# Patient Record
Sex: Female | Born: 1970 | Race: White | Hispanic: No | Marital: Married | Smoking: Never smoker
Health system: Southern US, Community
[De-identification: ages and names within clinical notes are randomized; demographics above are authoritative.]

## PROBLEM LIST (undated history)

## (undated) DIAGNOSIS — I10 Essential (primary) hypertension: Secondary | ICD-10-CM

## (undated) DIAGNOSIS — M1990 Unspecified osteoarthritis, unspecified site: Secondary | ICD-10-CM

## (undated) DIAGNOSIS — E611 Iron deficiency: Secondary | ICD-10-CM

## (undated) DIAGNOSIS — K219 Gastro-esophageal reflux disease without esophagitis: Secondary | ICD-10-CM

## (undated) DIAGNOSIS — N979 Female infertility, unspecified: Secondary | ICD-10-CM

## (undated) DIAGNOSIS — E538 Deficiency of other specified B group vitamins: Secondary | ICD-10-CM

## (undated) DIAGNOSIS — Z9889 Other specified postprocedural states: Secondary | ICD-10-CM

## (undated) DIAGNOSIS — Z903 Acquired absence of stomach [part of]: Secondary | ICD-10-CM

## (undated) DIAGNOSIS — N200 Calculus of kidney: Secondary | ICD-10-CM

## (undated) DIAGNOSIS — F32A Depression, unspecified: Secondary | ICD-10-CM

## (undated) DIAGNOSIS — F419 Anxiety disorder, unspecified: Secondary | ICD-10-CM

## (undated) DIAGNOSIS — J45909 Unspecified asthma, uncomplicated: Secondary | ICD-10-CM

## (undated) DIAGNOSIS — N840 Polyp of corpus uteri: Secondary | ICD-10-CM

## (undated) DIAGNOSIS — T7840XA Allergy, unspecified, initial encounter: Secondary | ICD-10-CM

## (undated) DIAGNOSIS — D649 Anemia, unspecified: Secondary | ICD-10-CM

## (undated) DIAGNOSIS — R112 Nausea with vomiting, unspecified: Secondary | ICD-10-CM

## (undated) DIAGNOSIS — F329 Major depressive disorder, single episode, unspecified: Secondary | ICD-10-CM

## (undated) HISTORY — DX: Deficiency of other specified B group vitamins: E53.8

## (undated) HISTORY — PX: COSMETIC SURGERY: SHX468

## (undated) HISTORY — PX: TONSILLECTOMY: SHX5217

## (undated) HISTORY — DX: Calculus of kidney: N20.0

## (undated) HISTORY — DX: Female infertility, unspecified: N97.9

## (undated) HISTORY — PX: APPENDECTOMY: SHX54

## (undated) HISTORY — DX: Anemia, unspecified: D64.9

## (undated) HISTORY — DX: Acquired absence of stomach (part of): Z90.3

## (undated) HISTORY — DX: Gastro-esophageal reflux disease without esophagitis: K21.9

## (undated) HISTORY — DX: Iron deficiency: E61.1

## (undated) HISTORY — DX: Essential (primary) hypertension: I10

## (undated) HISTORY — DX: Polyp of corpus uteri: N84.0

## (undated) HISTORY — PX: HERNIA REPAIR: SHX51

## (undated) HISTORY — DX: Unspecified osteoarthritis, unspecified site: M19.90

## (undated) HISTORY — PX: CHOLECYSTECTOMY: SHX55

## (undated) HISTORY — DX: Allergy, unspecified, initial encounter: T78.40XA

## (undated) HISTORY — PX: TUBAL LIGATION: SHX77

---

## 1998-06-10 ENCOUNTER — Ambulatory Visit (HOSPITAL_COMMUNITY): Admission: RE | Admit: 1998-06-10 | Discharge: 1998-06-10 | Payer: Self-pay | Admitting: Obstetrics and Gynecology

## 1999-10-11 ENCOUNTER — Other Ambulatory Visit: Admission: RE | Admit: 1999-10-11 | Discharge: 1999-10-11 | Payer: Self-pay | Admitting: Obstetrics and Gynecology

## 2000-11-10 ENCOUNTER — Emergency Department (HOSPITAL_COMMUNITY): Admission: EM | Admit: 2000-11-10 | Discharge: 2000-11-10 | Payer: Self-pay | Admitting: Emergency Medicine

## 2000-11-29 ENCOUNTER — Emergency Department (HOSPITAL_COMMUNITY): Admission: EM | Admit: 2000-11-29 | Discharge: 2000-11-29 | Payer: Self-pay | Admitting: Emergency Medicine

## 2000-11-30 ENCOUNTER — Encounter: Payer: Self-pay | Admitting: Emergency Medicine

## 2000-12-03 ENCOUNTER — Other Ambulatory Visit: Admission: RE | Admit: 2000-12-03 | Discharge: 2000-12-03 | Payer: Self-pay | Admitting: Obstetrics and Gynecology

## 2004-12-06 ENCOUNTER — Emergency Department (HOSPITAL_COMMUNITY): Admission: EM | Admit: 2004-12-06 | Discharge: 2004-12-07 | Payer: Self-pay | Admitting: Emergency Medicine

## 2006-02-21 ENCOUNTER — Ambulatory Visit: Payer: Self-pay | Admitting: Family Medicine

## 2006-03-04 ENCOUNTER — Ambulatory Visit: Payer: Self-pay | Admitting: Family Medicine

## 2006-04-04 ENCOUNTER — Ambulatory Visit: Payer: Self-pay | Admitting: Family Medicine

## 2006-04-23 ENCOUNTER — Ambulatory Visit: Payer: Self-pay | Admitting: Family Medicine

## 2006-05-07 ENCOUNTER — Ambulatory Visit: Payer: Self-pay | Admitting: Family Medicine

## 2006-06-05 ENCOUNTER — Ambulatory Visit: Payer: Self-pay | Admitting: Family Medicine

## 2006-06-11 DIAGNOSIS — F419 Anxiety disorder, unspecified: Secondary | ICD-10-CM | POA: Insufficient documentation

## 2006-06-11 DIAGNOSIS — E669 Obesity, unspecified: Secondary | ICD-10-CM | POA: Insufficient documentation

## 2006-06-11 DIAGNOSIS — G43909 Migraine, unspecified, not intractable, without status migrainosus: Secondary | ICD-10-CM | POA: Insufficient documentation

## 2006-06-11 DIAGNOSIS — F411 Generalized anxiety disorder: Secondary | ICD-10-CM | POA: Insufficient documentation

## 2006-06-11 DIAGNOSIS — E6609 Other obesity due to excess calories: Secondary | ICD-10-CM | POA: Insufficient documentation

## 2006-06-11 DIAGNOSIS — N809 Endometriosis, unspecified: Secondary | ICD-10-CM | POA: Insufficient documentation

## 2006-06-17 ENCOUNTER — Ambulatory Visit: Payer: Self-pay | Admitting: Family Medicine

## 2006-08-08 ENCOUNTER — Encounter: Payer: Self-pay | Admitting: Family Medicine

## 2006-09-03 HISTORY — PX: OTHER SURGICAL HISTORY: SHX169

## 2006-09-25 ENCOUNTER — Ambulatory Visit: Payer: Self-pay | Admitting: Family Medicine

## 2006-09-27 ENCOUNTER — Encounter: Payer: Self-pay | Admitting: Family Medicine

## 2006-09-27 LAB — CONVERTED CEMR LAB
Ferritin: 8 ng/mL — ABNORMAL LOW (ref 10–291)
Iron: 32 ug/dL — ABNORMAL LOW (ref 42–145)
Saturation Ratios: 10 % — ABNORMAL LOW (ref 20–55)
TIBC: 335 ug/dL (ref 250–470)
UIBC: 303 ug/dL

## 2006-09-30 ENCOUNTER — Telehealth: Payer: Self-pay | Admitting: Family Medicine

## 2006-10-03 ENCOUNTER — Telehealth: Payer: Self-pay | Admitting: Family Medicine

## 2006-10-23 ENCOUNTER — Ambulatory Visit: Payer: Self-pay | Admitting: Family Medicine

## 2006-10-24 ENCOUNTER — Encounter: Payer: Self-pay | Admitting: Family Medicine

## 2006-11-04 ENCOUNTER — Ambulatory Visit: Payer: Self-pay | Admitting: Family Medicine

## 2006-12-18 ENCOUNTER — Ambulatory Visit (HOSPITAL_COMMUNITY): Admission: RE | Admit: 2006-12-18 | Discharge: 2006-12-18 | Payer: Self-pay | Admitting: Obstetrics and Gynecology

## 2006-12-18 ENCOUNTER — Encounter (INDEPENDENT_AMBULATORY_CARE_PROVIDER_SITE_OTHER): Payer: Self-pay | Admitting: Specialist

## 2007-05-12 ENCOUNTER — Ambulatory Visit: Payer: Self-pay | Admitting: Family Medicine

## 2007-05-16 ENCOUNTER — Ambulatory Visit: Payer: Self-pay | Admitting: Family Medicine

## 2007-05-16 ENCOUNTER — Encounter: Admission: RE | Admit: 2007-05-16 | Discharge: 2007-05-16 | Payer: Self-pay | Admitting: Family Medicine

## 2007-05-16 DIAGNOSIS — D509 Iron deficiency anemia, unspecified: Secondary | ICD-10-CM | POA: Insufficient documentation

## 2007-05-16 DIAGNOSIS — J45909 Unspecified asthma, uncomplicated: Secondary | ICD-10-CM | POA: Insufficient documentation

## 2007-05-20 ENCOUNTER — Telehealth (INDEPENDENT_AMBULATORY_CARE_PROVIDER_SITE_OTHER): Payer: Self-pay | Admitting: *Deleted

## 2007-05-26 ENCOUNTER — Encounter: Payer: Self-pay | Admitting: Family Medicine

## 2007-05-26 LAB — CONVERTED CEMR LAB
ALT: 13 units/L (ref 0–35)
AST: 11 units/L (ref 0–37)
Albumin: 3.4 g/dL — ABNORMAL LOW (ref 3.5–5.2)
Alkaline Phosphatase: 27 units/L — ABNORMAL LOW (ref 39–117)
BUN: 12 mg/dL (ref 6–23)
CO2: 21 meq/L (ref 19–32)
Calcium: 8.1 mg/dL — ABNORMAL LOW (ref 8.4–10.5)
Chloride: 108 meq/L (ref 96–112)
Cholesterol: 187 mg/dL (ref 0–200)
Creatinine, Ser: 0.71 mg/dL (ref 0.40–1.20)
Ferritin: 7 ng/mL — ABNORMAL LOW (ref 10–291)
Glucose, Bld: 92 mg/dL (ref 70–99)
HCT: 37.2 % (ref 36.0–46.0)
HDL: 70 mg/dL (ref >39)
Hemoglobin: 11.5 g/dL — ABNORMAL LOW (ref 12.0–15.0)
LDL Cholesterol: 96 mg/dL (ref 0–99)
MCHC: 30.9 g/dL (ref 30.0–36.0)
MCV: 87.1 fL (ref 78.0–100.0)
Platelets: 142 10*3/uL — ABNORMAL LOW (ref 150–400)
Potassium: 4.3 meq/L (ref 3.5–5.3)
RBC: 4.27 M/uL (ref 3.87–5.11)
RDW: 15.3 % — ABNORMAL HIGH (ref 11.5–14.0)
Sodium: 139 meq/L (ref 135–145)
TSH: 3.633 microintl units/mL (ref 0.350–5.50)
Total Bilirubin: 0.2 mg/dL — ABNORMAL LOW (ref 0.3–1.2)
Total CHOL/HDL Ratio: 2.7
Total Protein: 6 g/dL (ref 6.0–8.3)
Triglycerides: 105 mg/dL (ref <150)
VLDL: 21 mg/dL (ref 0–40)
WBC: 5.9 10*3/uL (ref 4.0–10.5)

## 2007-05-28 ENCOUNTER — Telehealth: Payer: Self-pay | Admitting: Family Medicine

## 2007-05-29 ENCOUNTER — Ambulatory Visit: Payer: Self-pay | Admitting: Family Medicine

## 2007-05-29 LAB — CONVERTED CEMR LAB
Bilirubin Urine: NEGATIVE
Glucose, Urine, Semiquant: NEGATIVE
Ketones, urine, test strip: NEGATIVE
Nitrite: NEGATIVE
Specific Gravity, Urine: 1.025
Urobilinogen, UA: NEGATIVE
pH: 6

## 2007-05-30 ENCOUNTER — Encounter: Payer: Self-pay | Admitting: Family Medicine

## 2007-05-30 LAB — CONVERTED CEMR LAB: Thyroperoxidase Ab SerPl-aCnc: <25 (ref 0.0–60.0)

## 2007-06-04 ENCOUNTER — Ambulatory Visit: Payer: Self-pay | Admitting: Family Medicine

## 2007-06-04 DIAGNOSIS — D518 Other vitamin B12 deficiency anemias: Secondary | ICD-10-CM | POA: Insufficient documentation

## 2007-07-07 ENCOUNTER — Ambulatory Visit: Payer: Self-pay | Admitting: Family Medicine

## 2007-07-08 ENCOUNTER — Telehealth (INDEPENDENT_AMBULATORY_CARE_PROVIDER_SITE_OTHER): Payer: Self-pay | Admitting: *Deleted

## 2007-08-08 ENCOUNTER — Ambulatory Visit: Payer: Self-pay | Admitting: Family Medicine

## 2007-09-11 ENCOUNTER — Ambulatory Visit: Payer: Self-pay | Admitting: Family Medicine

## 2007-09-12 ENCOUNTER — Encounter: Payer: Self-pay | Admitting: Family Medicine

## 2007-09-15 ENCOUNTER — Telehealth: Payer: Self-pay | Admitting: Family Medicine

## 2007-10-09 ENCOUNTER — Ambulatory Visit: Payer: Self-pay | Admitting: Family Medicine

## 2007-10-30 ENCOUNTER — Ambulatory Visit: Payer: Self-pay | Admitting: Gastroenterology

## 2007-10-30 LAB — CONVERTED CEMR LAB
Folate: >20 ng/mL
Saturation Ratios: 10.9 % — ABNORMAL LOW (ref 20.0–50.0)
Transferrin: 236 mg/dL (ref >=212.0)
Vitamin B-12: 538 pg/mL (ref 211–911)

## 2007-11-03 ENCOUNTER — Encounter: Payer: Self-pay | Admitting: Gastroenterology

## 2007-11-03 ENCOUNTER — Encounter: Payer: Self-pay | Admitting: Family Medicine

## 2007-11-03 ENCOUNTER — Ambulatory Visit: Payer: Self-pay | Admitting: Gastroenterology

## 2007-11-03 LAB — HM COLONOSCOPY: HM Colonoscopy: NORMAL

## 2007-12-04 ENCOUNTER — Ambulatory Visit: Payer: Self-pay | Admitting: Gastroenterology

## 2007-12-11 ENCOUNTER — Ambulatory Visit: Payer: Self-pay | Admitting: Family Medicine

## 2007-12-11 DIAGNOSIS — E049 Nontoxic goiter, unspecified: Secondary | ICD-10-CM | POA: Insufficient documentation

## 2007-12-22 ENCOUNTER — Encounter: Payer: Self-pay | Admitting: Family Medicine

## 2007-12-22 DIAGNOSIS — K449 Diaphragmatic hernia without obstruction or gangrene: Secondary | ICD-10-CM | POA: Insufficient documentation

## 2008-06-07 ENCOUNTER — Telehealth: Payer: Self-pay | Admitting: Gastroenterology

## 2008-06-09 ENCOUNTER — Encounter: Payer: Self-pay | Admitting: Gastroenterology

## 2008-07-15 ENCOUNTER — Ambulatory Visit: Payer: Self-pay | Admitting: Family Medicine

## 2008-07-15 DIAGNOSIS — G4700 Insomnia, unspecified: Secondary | ICD-10-CM | POA: Insufficient documentation

## 2008-07-16 ENCOUNTER — Ambulatory Visit: Payer: Self-pay | Admitting: Family Medicine

## 2008-07-16 ENCOUNTER — Telehealth: Payer: Self-pay | Admitting: Family Medicine

## 2008-07-16 LAB — CONVERTED CEMR LAB
Protein, U semiquant: 30
Specific Gravity, Urine: >=1.03
pH: 6

## 2008-07-19 ENCOUNTER — Telehealth: Payer: Self-pay | Admitting: Family Medicine

## 2008-07-21 ENCOUNTER — Telehealth: Payer: Self-pay | Admitting: Family Medicine

## 2008-07-26 ENCOUNTER — Telehealth: Payer: Self-pay | Admitting: Family Medicine

## 2008-09-06 ENCOUNTER — Ambulatory Visit: Payer: Self-pay | Admitting: Family Medicine

## 2008-09-17 ENCOUNTER — Telehealth: Payer: Self-pay | Admitting: Family Medicine

## 2009-06-16 ENCOUNTER — Ambulatory Visit: Payer: Self-pay | Admitting: Gastroenterology

## 2009-06-16 DIAGNOSIS — K589 Irritable bowel syndrome without diarrhea: Secondary | ICD-10-CM | POA: Insufficient documentation

## 2009-06-20 ENCOUNTER — Ambulatory Visit (HOSPITAL_COMMUNITY): Admission: RE | Admit: 2009-06-20 | Discharge: 2009-06-20 | Payer: Self-pay | Admitting: Gastroenterology

## 2009-06-20 LAB — CONVERTED CEMR LAB
Albumin: 3.7 g/dL (ref 3.5–5.2)
Alkaline Phosphatase: 40 units/L (ref 39–117)
Basophils Absolute: 0 10*3/uL (ref 0.0–0.1)
Bilirubin, Direct: 0.1 mg/dL (ref 0.0–0.3)
Calcium: 9 mg/dL (ref 8.4–10.5)
Ferritin: 7.8 ng/mL — ABNORMAL LOW (ref 10.0–291.0)
Folate: 7 ng/mL
GFR calc non Af Amer: 85.23 mL/min (ref >60)
HCT: 37.2 % (ref 36.0–46.0)
Lymphs Abs: 1.2 10*3/uL (ref 0.7–4.0)
MCHC: 33.8 g/dL (ref 30.0–36.0)
MCV: 81.9 fL (ref 78.0–100.0)
Monocytes Absolute: 0.4 10*3/uL (ref 0.1–1.0)
Platelets: 183 10*3/uL (ref 150.0–400.0)
RDW: 15.1 % — ABNORMAL HIGH (ref 11.5–14.6)
Saturation Ratios: 16.2 % — ABNORMAL LOW (ref 20.0–50.0)
Sodium: 141 meq/L (ref 135–145)
Transferrin: 273.6 mg/dL (ref 212.0–360.0)
Vitamin B-12: 277 pg/mL (ref 211–911)

## 2009-06-24 ENCOUNTER — Ambulatory Visit: Payer: Self-pay | Admitting: Family Medicine

## 2009-08-30 ENCOUNTER — Encounter: Admission: RE | Admit: 2009-08-30 | Discharge: 2009-09-01 | Payer: Self-pay | Admitting: Specialist

## 2009-09-03 HISTORY — PX: OTHER SURGICAL HISTORY: SHX169

## 2009-09-08 ENCOUNTER — Encounter: Admission: RE | Admit: 2009-09-08 | Discharge: 2009-12-07 | Payer: Self-pay | Admitting: Specialist

## 2010-01-23 ENCOUNTER — Encounter: Admission: RE | Admit: 2010-01-23 | Discharge: 2010-03-16 | Payer: Self-pay | Admitting: Specialist

## 2010-04-25 ENCOUNTER — Ambulatory Visit: Payer: Self-pay | Admitting: Family Medicine

## 2010-06-19 ENCOUNTER — Telehealth: Payer: Self-pay | Admitting: Family Medicine

## 2010-06-19 ENCOUNTER — Encounter: Payer: Self-pay | Admitting: Gastroenterology

## 2010-06-20 ENCOUNTER — Encounter: Payer: Self-pay | Admitting: Family Medicine

## 2010-06-20 ENCOUNTER — Encounter: Payer: Self-pay | Admitting: Gastroenterology

## 2010-06-21 LAB — CONVERTED CEMR LAB
AST: 12 units/L (ref 0–37)
BUN: 12 mg/dL (ref 6–23)
CO2: 20 meq/L (ref 19–32)
Calcium: 8.8 mg/dL (ref 8.4–10.5)
Chloride: 106 meq/L (ref 96–112)
Cholesterol: 176 mg/dL (ref 0–200)
Creatinine, Ser: 0.84 mg/dL (ref 0.40–1.20)
Glucose, Bld: 95 mg/dL (ref 70–99)
HCT: 38.5 % (ref 36.0–46.0)
HDL: 56 mg/dL (ref >39)
Hemoglobin: 12.6 g/dL (ref 12.0–15.0)
RBC: 4.58 M/uL (ref 3.87–5.11)
RDW: 15.4 % (ref 11.5–15.5)
Saturation Ratios: 12 % — ABNORMAL LOW (ref 20–55)
Total CHOL/HDL Ratio: 3.1
Triglycerides: 50 mg/dL (ref <150)

## 2010-06-23 ENCOUNTER — Ambulatory Visit: Payer: Self-pay | Admitting: Family Medicine

## 2010-06-30 ENCOUNTER — Ambulatory Visit: Payer: Self-pay | Admitting: Gastroenterology

## 2010-09-03 HISTORY — PX: OTHER SURGICAL HISTORY: SHX169

## 2010-10-05 NOTE — Miscellaneous (Signed)
Summary: Labs and Aciphex  Called Dr. Jeneen Rinks office to see if they will add IBC, Ferritin, B12, and folate to the labs that they already plan on drawing. I will refill the Aciphex for one month to give the patient time to have her labs.  Prescriptions: ACIPHEX 20 MG TBEC (RABEPRAZOLE SODIUM) 1 by mouth two times a day  #60 x 0   Entered by:   Bernita Buffy CMA ([place])   Authorized by:   Sable Feil MD [facility]   Signed by:   Bernita Buffy CMA ([place]) on 06/19/2010   Method used:   Electronically to        [phone]* (retail)       [address], [place]  [address]       Ph: [phone]       Fax: [phone]   RxID:   [id number]

## 2010-10-05 NOTE — Assessment & Plan Note (Signed)
Summary: sinusitis   Vital Signs:  Patient profile:   40 year old female Height:      65.5 inches Weight:      265 pounds BMI:     43.58 O2 Sat:      99 % on Room air Temp:     98.3 degrees F oral Pulse rate:   109 / minute BP sitting:   141 / 84  (left arm) Cuff size:   large  Vitals Entered By: Sherlean Foot CMA (April 25, 2010 2:32 PM)  O2 Flow:  Room air CC: congestion and ears stopped up x 5 days.   Primary Care Provider:  Loyal Gambler DO  CC:  congestion and ears stopped up x 5 days.Marland Kitchen  History of Present Illness: 40 yo WF presents for head congestion x 5 days with ear pressure that worsened on the L side yesterday.  Started with a sore throat.  Denies any cough.  No rhinorrhea but has postnasal drip.  She has increased fatigue.  She does have allergies.  She denies facial pressure or HAs.    She is taking Mucinex-D which is not helping.    Current Medications (verified): 1)  Xopenex Hfa 45 Mcg/act Aero (Levalbuterol Tartrate) .... 2 Puffs Q 4-6 H Prn 2)  Aciphex 20 Mg Tbec (Rabeprazole Sodium) .Marland Kitchen.. 1 By Mouth Two Times A Day  Allergies (verified): No Known Drug Allergies  Past History:  Past Medical History: Reviewed history from 12/22/2007 and no changes required. [facility] Flow 440  06/05/2006  grief rxn -- may need counseling infertility  uterine polyps-- sees Gyn for paps migraines and sleep problems-- sees Dr Berdine Addison, EEG normal 12-07 GERD with moderate hiatal hernia (EGD 3-09), Dr Sharlett Iles  Past Surgical History: Tonsillectomy Endometrial ablation spring 2008 R knee lateral release surgery 2011  Social History: Reviewed history from 06/16/2009 and no changes required. 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  Unable to have children.  Has 5 dogs.  Father ill with terminal cancer.  Wants to lose wt.  Not exercising.  Works part time at [employer]. Patient has never smoked.  Alcohol Use - yes  occ  Review of Systems      See  HPI  Physical Exam  General:  alert, well-developed, well-nourished, and well-hydrated.  obese Head:  normocephalic and atraumatic.  sinuses NTTP Eyes:  conjunctiva clear Ears:  EACs patent; TMs translucent and gray with good cone of light and bony landmarks.  Nose:  erythematous, slightly boggy nasal turbinates, no rhinorrhea Mouth:  o/p pink and moist, slightly injected Neck:  no masses.  tender, mildly enlarged submandibular LNs Lungs:  Normal respiratory effort, chest expands symmetrically. Lungs are clear to auscultation, no crackles or wheezes. Heart:  Normal rate and regular rhythm. S1 and S2 normal without gallop, murmur, click, rub or other extra sounds. Skin:  color normal.     Impression & Recommendations:  Problem # 1:  ACUTE SINUSITIS, UNSPECIFIED (ICD-461.9) Treat with 5 days of Zithromax + Veramyst nasal spray.  Call if not improving after 7-10 days.   Her updated medication list for this problem includes:    Azithromycin 250 Mg Tabs (Azithromycin) .Marland Kitchen... 2 tabs by mouth x 1 day then 1 tab by mouth daily x 4 days    Veramyst 27.5 Mcg/spray Susp (Fluticasone furoate) .Marland Kitchen... 2 sprays per nostril daily  Complete Medication List: 1)  Xopenex Hfa 45 Mcg/act Aero (Levalbuterol tartrate) .... 2 puffs q 4-6 h prn  2)  Aciphex 20 Mg Tbec (Rabeprazole sodium) .Marland Kitchen.. 1 by mouth two times a day 3)  Azithromycin 250 Mg Tabs (Azithromycin) .... 2 tabs by mouth x 1 day then 1 tab by mouth daily x 4 days 4)  Veramyst 27.5 Mcg/spray Susp (Fluticasone furoate) .... 2 sprays per nostril daily  Patient Instructions: 1)  For sinusitis: 2)  Take 5 days of Azithromycin. 3)  Use Veramyst 2 sprays/ nostril daily. 4)  Use Mucinex D as needed for the next wk. 5)  Call if not improved after 7-10 days. 6)  Return for a PHYSICAL with fasting labs in 6 wks. Prescriptions: AZITHROMYCIN 250 MG TABS (AZITHROMYCIN) 2 tabs by mouth x 1 day then 1 tab by mouth daily x 4 days  #6 tabs x 0   Entered and  Authorized by:   Loyal Gambler DO   Signed by:   Loyal Gambler DO on 04/25/2010   Method used:   Electronically to        [facility] [phone]* (retail)       [address] [place], [place]  [address]       Ph: [phone]       Fax: [phone]   RxID:   [id number]

## 2010-10-05 NOTE — Assessment & Plan Note (Signed)
Summary: f/u on labs- jr   Vital Signs:  Patient profile:   40 year old female Height:      65.5 inches Weight:      270 pounds Pulse rate:   95 / minute BP sitting:   131 / 81  (left arm) Cuff size:   large  Vitals Entered By: Geanie Kenning LPN (October 21, 624THL 1:52 PM) CC: go over lab results   Primary Care Provider:  Loyal Gambler DO  CC:  go over lab results.  History of Present Illness: 40 yo WF presents for iron deficiency.    She just had labs drawn. Her periods come every month and are light, lasting 2-3 days.  She has not had any rectal bleeding. She has not had abd pain.  Had a normal EGD/ colonoscopy in 09. Feeling cold all the time and has fatigue.  B12 level also came back low normal.  Current Medications (verified): 1)  Xopenex Hfa 45 Mcg/act Aero (Levalbuterol Tartrate) .... 2 Puffs Q 4-6 H Prn 2)  Aciphex 20 Mg Tbec (Rabeprazole Sodium) .Marland Kitchen.. 1 By Mouth Two Times A Day 3)  Azithromycin 250 Mg Tabs (Azithromycin) .... 2 Tabs By Mouth X 1 Day Then 1 Tab By Mouth Daily X 4 Days  Allergies (verified): No Known Drug Allergies  Comments:  Nurse/Medical Assistant: The patient's medications and allergies were reviewed with the patient and were updated in the Medication and Allergy Lists. Geanie Kenning LPN (October 21, 624THL 1:53 PM)  Past History:  Past Medical History: [facility] Flow 440  06/05/2006  grief rxn -- may need counseling infertility  uterine polyps-- sees Gyn for paps migraines and sleep problems-- sees Dr Berdine Addison, EEG normal 12-07 GERD with moderate hiatal hernia (EGD 3-09), Dr Sharlett Iles  iron def B12 def  Past Surgical History: Reviewed history from 04/25/2010 and no changes required. Tonsillectomy Endometrial ablation spring 2008 R knee lateral release surgery 2011  Social History: Reviewed history from 06/16/2009 and no changes required. 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  Unable to have children.  Has 5  dogs.  Father ill with terminal cancer.  Wants to lose wt.  Not exercising.  Works part time at [employer]. Patient has never smoked.  Alcohol Use - yes  occ  Review of Systems      See HPI  Physical Exam  General:  alert, well-developed, well-nourished, and well-hydrated.  obese Neck:  no masses.   Lungs:  Normal respiratory effort, chest expands symmetrically. Lungs are clear to auscultation, no crackles or wheezes. Heart:  Normal rate and regular rhythm. S1 and S2 normal without gallop, murmur, click, rub or other extra sounds. Abdomen:  soft and non-tender.   Skin:  color normal.  conjunctival pallor   Impression & Recommendations:  Problem # 1:  ANEMIA, IRON DEFICIENCY NOS (ICD-280.9) She had N/V from [facility].  Will try her on Ferrous Sulfate 2 x a day.  REviewed recent labs that were drawn.  Not having heavy menses and had a normal EGD/ colonoscopy in 09.  She is not having abd pain or rectal bleeding and has a normal diet.  If she tolerates this iron, plan to repeat levels in 8 wks.  If not, refer to hematologist for IV iron. Her updated medication list for this problem includes:    Ferrous Sulfate 325 (65 Fe) Mg Tabs (Ferrous sulfate) .Marland Kitchen... 1 tab by mouth two times a day  Hgb: 12.6 (06/20/2010)   Hct:  38.5 (06/20/2010)   Platelets: 210 (06/20/2010) RBC: 4.58 (06/20/2010)   RDW: 15.4 (06/20/2010)   WBC: 6.3 (06/20/2010) MCV: 84.1 (06/20/2010)   MCHC: 32.7 (06/20/2010) Ferritin: 13 (06/20/2010) Iron: 45 (06/20/2010)   TIBC: 371 (06/20/2010)   % Sat: 12 (06/20/2010) B12: 335 (06/20/2010)   Folate: 7.0 (06/16/2009)   TSH: 1.20 (06/16/2009)  Problem # 2:  ANEMIA, VITAMIN B12 DEFICIENCY (ICD-281.1) Will try her on sublingual B12 once daily.  Declined B12 injections.  REpeat B12 level in 8 wks. Her updated medication list for this problem includes:    Ferrous Sulfate 325 (65 Fe) Mg Tabs (Ferrous sulfate) .Marland Kitchen... 1 tab by mouth two times a day  Hgb: 12.6 (06/20/2010)   Hct: 38.5  (06/20/2010)   Platelets: 210 (06/20/2010) RBC: 4.58 (06/20/2010)   RDW: 15.4 (06/20/2010)   WBC: 6.3 (06/20/2010) MCV: 84.1 (06/20/2010)   MCHC: 32.7 (06/20/2010) Ferritin: 13 (06/20/2010) Iron: 45 (06/20/2010)   TIBC: 371 (06/20/2010)   % Sat: 12 (06/20/2010) B12: 335 (06/20/2010)   Folate: 7.0 (06/16/2009)   TSH: 1.20 (06/16/2009)  Complete Medication List: 1)  Xopenex Hfa 45 Mcg/act Aero (Levalbuterol tartrate) .... 2 puffs q 4-6 h prn 2)  Aciphex 20 Mg Tbec (Rabeprazole sodium) .Marland Kitchen.. 1 by mouth two times a day 3)  Azithromycin 250 Mg Tabs (Azithromycin) .... 2 tabs by mouth x 1 day then 1 tab by mouth daily x 4 days 4)  Ferrous Sulfate 325 (65 Fe) Mg Tabs (Ferrous sulfate) .Marland Kitchen.. 1 tab by mouth two times a day  Patient Instructions: 1)  Try Ferrous Sulfate 1 tab by mouth two times a day for iron deficiency. 2)  You may want to take Colace (a stool softener) along w/ it to prevent constipation. 3)  Start OTC B12 sublingual daily. 4)  Return for f/u iron deficiency in 8 wks. Prescriptions: FERROUS SULFATE 325 (65 FE) MG TABS (FERROUS SULFATE) 1 tab by mouth two times a day  #60 x 1   Entered and Authorized by:   Loyal Gambler DO   Signed by:   Loyal Gambler DO on 06/23/2010   Method used:   Electronically to        [facility] [phone]* (retail)       [place], [place]  [address]       Ph: [phone]       Fax: [phone]   RxID:   [id number]    Orders Added: 1)  Est. Patient Level III [id number]

## 2010-10-05 NOTE — Medication Information (Signed)
Summary: P Auth/Approval/[employer]  P Auth/Approval/[employer]   Imported By: Bubba Hales 06/27/2010 09:54:16  _____________________________________________________________________  External Attachment:    Type:   Image     Comment:   External Document

## 2010-10-05 NOTE — Progress Notes (Signed)
Summary: Lab order  Phone Note Call from Patient   Caller: Patient Summary of Call: Pt scheduled OV for f/u next week. She would like to have fasting labs done tomorrow. Please enter labs along w/ Dr. Buel Ream order  (IBC, Ferritin, B12, and folate) and I will fax.  Initial call taken by: Sherlean Foot CMA,  June 19, 2010 3:15 PM  Follow-up for Phone Call        labs put in.  Can draw fasting any morning M thru F Follow-up by: Loyal Gambler DO,  June 19, 2010 3:53 PM  New Problems: OTH&UNSPEC ENDOCRN NUTRIT METAB&IMMUNITY D/O (ICD-V77.99) SCREENING FOR LIPOID DISORDERS (ICD-V77.91)   New Problems: OTH&UNSPEC ENDOCRN NUTRIT METAB&IMMUNITY D/O (ICD-V77.99) SCREENING FOR LIPOID DISORDERS (ICD-V77.91)

## 2011-01-02 ENCOUNTER — Encounter: Payer: Self-pay | Admitting: Family Medicine

## 2011-01-02 ENCOUNTER — Ambulatory Visit (INDEPENDENT_AMBULATORY_CARE_PROVIDER_SITE_OTHER): Payer: 59 | Admitting: Family Medicine

## 2011-01-02 ENCOUNTER — Telehealth: Payer: Self-pay | Admitting: Family Medicine

## 2011-01-02 VITALS — BP 153/94 | HR 110 | Ht 65.5 in | Wt 287.0 lb

## 2011-01-02 DIAGNOSIS — R600 Localized edema: Secondary | ICD-10-CM

## 2011-01-02 DIAGNOSIS — E538 Deficiency of other specified B group vitamins: Secondary | ICD-10-CM

## 2011-01-02 DIAGNOSIS — R609 Edema, unspecified: Secondary | ICD-10-CM

## 2011-01-02 DIAGNOSIS — G43909 Migraine, unspecified, not intractable, without status migrainosus: Secondary | ICD-10-CM

## 2011-01-02 LAB — POCT URINALYSIS DIPSTICK
Glucose, UA: NEGATIVE
Leukocytes, UA: NEGATIVE
Nitrite, UA: NEGATIVE
Protein, UA: NEGATIVE
Urobilinogen, UA: 0.2

## 2011-01-02 MED ORDER — FUROSEMIDE 40 MG PO TABS: 40.0000 mg | ORAL_TABLET | Freq: Every day | ORAL | Status: DC

## 2011-01-02 MED ORDER — NORTRIPTYLINE HCL 25 MG PO CAPS: 25.0000 mg | ORAL_CAPSULE | Freq: Every day | ORAL | Status: DC

## 2011-01-02 NOTE — Telephone Encounter (Signed)
Patient request to call her cell phone once her results are back

## 2011-01-02 NOTE — Progress Notes (Signed)
  Subjective:    Patient ID: Denise Craig, female    DOB: [birth year], 40 y.o.   MRN: [id number]  HPI 40 yo WF presents for increased foot swelling for the past 2 wks.  She flew last wk and swelling worse.  When she works at [employer] , she stands a lot.  She has been getting more frequent migraines, now behind her R eye.  She is not in pain right now.  She is taking Aleve several days/ wk for HAs.  She is seeing ortho for her knee pain that did not improve after surgery.  This pain is limiting her ability to exercise and she has continued to gain wt.  Denies problems voiding.   BP 153/94  Pulse 110  Ht 5' 5.5" (1.664 m)  Wt 287 lb (130.182 kg)  BMI 47.03 kg/m2  SpO2 98%  LMP 11/21/2010  Patient Active Problem List  Diagnoses  . THYROMEGALY  . OBESITY, NOS  . ANEMIA, IRON DEFICIENCY NOS  . ANEMIA, VITAMIN B12 DEFICIENCY  . ANXIETY  . DEPRESSION  . MIGRAINE, UNSPEC., W/O INTRACTABLE MIGRAINE  . EYE PAIN, RIGHT  . ACUTE SINUSITIS, UNSPECIFIED  . ASTHMA, PERSISTENT, MODERATE  . GERD  . HIATAL HERNIA  . IBS  . ENDOMETRIOSIS  . LUMBAGO  . INSOMNIA  . ABDOMINAL PAIN-RUQ  . ABNORMAL RESULT, FUNCTION STUDY, LIVER     Review of Systems  Constitutional: Negative for fatigue.  Respiratory: Negative for shortness of breath.   Cardiovascular: Positive for leg swelling. Negative for chest pain and palpitations.  Genitourinary: Negative for difficulty urinating.  Musculoskeletal: Positive for arthralgias.  Neurological: Positive for headaches.  Psychiatric/Behavioral: Positive for dysphoric mood.       Objective:   Physical Exam  Constitutional: She appears well-developed and well-nourished. No distress.       obese  Eyes: Conjunctivae are normal. No scleral icterus.  Neck: Neck supple. No thyromegaly present.  Cardiovascular: Normal rate, regular rhythm and normal heart sounds.  Exam reveals no friction rub.   No murmur heard. Pulmonary/Chest: Effort normal and breath  sounds normal. No respiratory distress. She has no wheezes.  Musculoskeletal: She exhibits edema (1+ pitting pedal edema bilat).  Lymphadenopathy:    She has no cervical adenopathy.  Skin: Skin is warm and dry.  Psychiatric: She has a normal mood and affect.          Assessment & Plan:

## 2011-01-02 NOTE — Assessment & Plan Note (Signed)
Increased frequency of migraines with overuse of Aleve which may be one of the causes for her leg swellling.  Will start her on Pamelor 25 mg qhs for migraine prevention.  Change aleve to tylenol prn.  rtc for f/u migraines in 6 wks.

## 2011-01-02 NOTE — Assessment & Plan Note (Signed)
Edema has worsened possibly from aleve use, obesity.  Urine is negative for protein.   Will  Get CMP today.  She had a TSH with her OB today. She is to stick to a low sodium diet, elevate legs and use Lasix 40 mg/day for the nexjt 10 days and then recheck. This should also bring her BP down.

## 2011-01-02 NOTE — Patient Instructions (Addendum)
Labs today. Will call you w/ results tomorrow.  Stop Aleve.  Use Tylenol for aches and pains.  Elevate legs as much as you can. Stick to a low sodium diet.  Start Furosemide once daily. Eat 1 banana/ day while on this to replace potassium.  Start Pamelor at bedtime for migraine prevention.  REturn for f/u swelling and migraines in 3 wks.

## 2011-01-03 LAB — COMPLETE METABOLIC PANEL WITH GFR
Alkaline Phosphatase: 49 U/L (ref 39–117)
CO2: 20 mEq/L (ref 19–32)
Creat: 0.76 mg/dL (ref 0.40–1.20)
GFR, Est African American: >60 mL/min (ref >60)
GFR, Est Non African American: >60 mL/min (ref >60)
Glucose, Bld: 113 mg/dL — ABNORMAL HIGH (ref 70–99)
Sodium: 138 mEq/L (ref 135–145)
Total Bilirubin: 0.3 mg/dL (ref 0.3–1.2)

## 2011-01-04 ENCOUNTER — Telehealth: Payer: Self-pay | Admitting: Family Medicine

## 2011-01-04 NOTE — Telephone Encounter (Signed)
LMOM informing Pt of the above

## 2011-01-04 NOTE — Telephone Encounter (Signed)
Pls let pt know that her chemsitry panel shows a sugar of 113 -- was she fasting??? Vitamin B12 is low normal.  She should be taking an OTC B complex vitamin daily.

## 2011-01-16 NOTE — Assessment & Plan Note (Signed)
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  Mrs. Going has had iron deficiency anemia and low B12 counts for the last  several months, and has been on oral iron replacement therapy.  This  caused irritable bowel dysfunction with alternating diarrhea and  constipation.  She occasionally will have some rectal bleeding with  constipation.  She has been on oral iron without improvement in her  anemia with a hemoglobin of approximately 11.5 with a low serum ferritin  of 4.  Besides her alternating bowel habits, she has chronic acid reflux  symptoms with associated nocturnal waking, and does have some associated  asthmatic bronchitis symptomatology.  She denies dysphagia or any  hepatobiliary complaints.  She has never had barium studies or  endoscopic exams of her bowels.   She has been treated for asthma and chronic headaches by Dr. Valetta Close, and  is also followed from a GYN standpoint by Dr. Orvan Seen.  She apparently  has endometriosis, had endometrial ablation, but continues with  menometrorrhagia.  The patient apparently desires a hysterectomy and  this has not been performed.   In terms of abdominal pain, she does occasionally have lower abdominal  discomfort with her gas and bloating, but denies severe abdominal pain  or any upper GI discomfort, history of hepatitis or pancreatitis.  Her  appetite is good and she has gained 10 pounds in weight over the last  year.   PAST MEDICAL HISTORY:  Remarkable for asthmatic bronchitis for the last  10 years, and she said she personally relates this with her acid reflux.  She also has a history of anxiety, depression, and  panic attacks.  She  has had a previous tonsillectomy, but no abdominal surgery.   MEDICATIONS:  She takes a daily iron tablet, Xopenex and p.r.n. Imitrex.  Her drug list from Dr. Valetta Close includes a variety of p.r.n. medications  including inhalers, Soma, Singulair, amlodipine 10 mg once a day,  Ketoprofen 75 mg with headaches, p.r.n. metoclopramide, and p.r.n.  Meclizine.  Patient denies drug allergies.  She does use p.r.n.  antacids, but denies Maalox or Mylanta use.   FAMILY HISTORY:  Remarkable for her father dying of colon cancer at age  51.  She also had a grandmother with colon cancer and polyps.  Her  sister has chronic GERD and has had several esophageal strictures.  Family history is also remarkable for atherosclerotic cardiovascular  disease, diabetes, and her mother had breast cancer.   SOCIAL HISTORY:  She is married, lives with her husband and has a 12th  grade education.  She does not smoke and uses ethanol socially, but has  never had problems with ethanol abuse or addiction.   REVIEW OF SYSTEMS:  Noncontributory except for minor complaints such as  insomnia, swelling  of her lower extremities, painful menses, excessive  urination, chronic fatigue, some mild urinary incontinency.  She denies  any current cardiovascular, pulmonary or general medical problems such  as fever, chills, skin rashes, joint pains, oral stomatitis, etc.   EXAMINATION:  Exam shows her to be a healthy appearing, white female in  no distress, appearing her stated age.  She is 5 feet 6-1/2 inches and  weighs 241 pounds.  Blood pressure is 100/80 and pulse was 80, regular.  I could not appreciate stigmata of chronic liver disease or thyromegaly.  Her chest was clear and she was in a regular rhythm without murmurs,  gallops or rubs.  There was no organomegaly, abdominal masses or  tenderness.  Bowel sounds were normal.  Rectal exam was deferred.  Peripheral extremities were unremarkable.  Mental status  was clear.   ASSESSMENT:  1. I think that Mrs. Arntz most likely has iron deficiency secondary to      a menometrorrhagia.  However, I would agree with Dr. Valetta Close that her      iron levels have not improved on oral iron therapy, and we need to      exclude an occult gastrointestinal source of blood loss versus iron      malabsorption from a process such as celiac disease.  2. Classic chronic gastroesophageal reflux disease with probable      associated asthmatic bronchitis.  3. Mild exogenous obesity.  4. Menometrorrhagia with history of endometriosis and previous      endometrial ablation per her gynecologist.  5. History of B12 deficiency of unexplained etiology-rule out      malabsorption versus pernicious anemia versus occult bacterial      overgrowth syndrome.  6. Family history of colon carcinoma in a first degree relative.  7. Intermittent rectal bleeding probably from hemorrhoids.   RECOMMENDATIONS:  1. Reflux regime explained to patient.  Started on Aciphex 20 mg 30      minutes before the first meal of the day.  2. Outpatient endoscopy and colonoscopy.  3. Check anti-parietal cell antibodies,and anti-intrinsic factor      antibodies.  4. Check celiac panel.  5. Obtain small bowel biopsy at the time of her endoscopy.  6. We now have stopped her iron replacement therapy until her workup      is completed.     Loralee Pacas. Sharlett Iles, MD, [facility], [place]  Electronically Signed    DRP/MedQ  DD: 10/30/2007  DT: 10/30/2007  Job #: [id number]   cc:   Loyal Gambler, D.O.

## 2011-01-16 NOTE — Assessment & Plan Note (Signed)
[facility] OFFICE NOTE   Denise Craig, Denise Craig                        MRN:          [id number]  DATE:12/04/2007                            DOB:          03/09/1971    Kemba returns today and is asymptomatic in terms of her acid reflux on  daily p.m. pantoprazole 40 mg before breakfast.  She does occasionally  have some nocturnal breakthrough symptoms managed by taking p.r.n.  antacids.   Her colonoscopy was entirely normal on November 03, 2007.  Her endoscopy  showed a 4-cm hiatal hernia with free reflux and mild esophagitis.  Biopsies of the stomach for Helicobacter pylori were normal.  Celiac  blood panel was normal and iron saturation was 11% with serum ferritin  of 18.  B12 and folate levels were normal.   PHYSICAL EXAMINATION:  VITAL SIGNS:  The patient weighs 244 pounds,  blood pressure 110/66, and pulse was 68 and regular.  General physical exam was not repeated at this time.   ASSESSMENT:  1. Chronic acid reflux with moderate sized hiatal hernia.  2. Exogenous obesity.  3. History of endometriosis being followed by her gynecologist.  4. B12 level was normal despite her allegation that she is B12      deficient.  5. Family history of colon cancer with negative colonoscopy.   RECOMMENDATIONS:  1. Continue reflux regimen and Protonix 40 mg before breakfast with      twice a day usage as needed for her dietary habits.  2. I have advised her that she needs weight loss reduction program and      graded exercise program and we would be glad to help with this      should she consent.  3. Continue other medications per other physicians.  4. Yearly GI followup with p.r.n. colonoscopies as per her clinical      course.     Loralee Pacas. Sharlett Iles, MD, [facility], [place]  Electronically Signed    DRP/MedQ  DD: 12/04/2007  DT: 12/04/2007  Job #: [id number]   cc:   Loyal Gambler, D.O.  Kathryne Eriksson, Dr.

## 2011-01-19 NOTE — Op Note (Signed)
NAMEGABBANELLI, PETILLO                 ACCOUNT NO.:  [id number]   MEDICAL RECORD NO.:  [id number]          PATIENT TYPE:  AMB   LOCATION:  [place]                           FACILITY:  [facility]   PHYSICIAN:  Marylynn Pearson, MD    DATE OF BIRTH:  29-Jul-1971   DATE OF PROCEDURE:  12/18/2006  DATE OF DISCHARGE:  12/18/2006                               OPERATIVE REPORT   PREOPERATIVE DIAGNOSES:  1. Menorrhagia.  2. Possible endometrial polyp.   POSTOPERATIVE DIAGNOSES:  1. Menorrhagia.  2. Possible endometrial polyp.  3. Pathology pending.   PROCEDURE:  Hysteroscopy D&C.   SURGEON:  Julien Girt.   ANESTHESIA:  General.   SPECIMEN:  Endometrial curettings.   ESTIMATED BLOOD LOSS:  None.   COMPLICATIONS:  None.   CONDITION:  Stable and extubated to recovery room.   PROCEDURE:  The patient was taken to the operating room where general  anesthesia was obtained.  She was placed in the dorsal lithotomy  position using Allen stirrups.  She was prepped and draped in sterile  fashion, and her bladder was drained sterilely with a catheter.  Bivalve  speculum was then placed in the vagina.  Single-tooth tenaculum was used  to grasp the anterior lip of the cervix.  The cervix was then serially  dilated using Pratt dilators.  The hysteroscope was then inserted into  the cavity, and a survey of the endometrial cavity was performed.  Overall, the endometrium appeared atrophic.  Bilateral ostia were  visualized and appeared normal.  There was a small polypoid mass noted  upon entry as an internal cervical os.  The hysteroscope was then  removed.  A curettage was performed.  The hysteroscope was reinserted.  No further polypoid masses were noted.  The specimen was passed off and  sent to pathology.  All instruments were then removed from the vagina.  The patient was taken to the recovery room in stable condition.     Marylynn Pearson, MD    GA/MEDQ  D:  01/17/2007  T:  01/17/2007  Job:  [id number]

## 2011-01-19 NOTE — H&P (Signed)
Denise Craig, Denise Craig                 ACCOUNT NO.:  [id number]   MEDICAL RECORD NO.:  [id number]          PATIENT TYPE:  AMB   LOCATION:  [place]                           FACILITY:  [facility]   PHYSICIAN:  Marylynn Pearson, MD    DATE OF BIRTH:  [birth year]   DATE OF ADMISSION:  DATE OF DISCHARGE:                              HISTORY & PHYSICAL   40 year old white female who presents to the office in January with  complaints of heavy menstrual cycles with blood clots.  She also  complains of worsening dysmenorrhea the first 1-2 days of her menstrual  cycle.  She has menstrual cycles monthly and has been trying to get  pregnant without success for 2-3 years.  She has been evaluated by  Tana Conch. Mezer, M.D.   PAST MEDICAL HISTORY:  Asthma, migraines and panic attacks.   SURGICAL HISTORY:  D&C with polypectomy and tonsillectomy.   SURGICAL HISTORY:  Negative for tobacco, alcohol and drug use.   OB HISTORY:  Negative.   GYN HISTORY:  History of normal Pap smears in the past.   MEDICATIONS:  Xopenex, Qvar, Ativan, Imitrex, Soma, Frova, Singulair,  tramadol, felodipine, ketoprofen, metoclopramide, Effexor, and tandem.   FAMILY HISTORY:  Is negative for GYN, colon, or breast cancer.   PHYSICAL EXAM:  VITAL SIGNS:  Height 5 feet 6 inches, weight 270, BMI of  43.6, blood pressure 118/78, hemoglobin 12.  Urinalysis negative.  HEENT:  NECK:  Normal.  No thyromegaly or nodularity.  HEART: Regular rate and rhythm.  LUNGS: Clear bilaterally.  BREASTS:  Symmetrical.  No palpable masses or lesions.  ABDOMEN:  Soft, obese, nontender.  PELVIC: Reveals normal external female genitalia.  Vagina and cervix are  normal without lesions.  Uterus is mobile and nontender.  Difficult to  palpate due to habitus.  No adnexal masses are noted.   SPECIALTY ASSESSMENTS:  Sonohysterogram was performed in our office  showing normal appearing adnexa bilaterally, irregular shaggy  endometrial lining with a questionable  small polypoid mass seen  measuring approximately 5 mm.  Risks, benefits and alternatives to the  procedure were discussed with the patient and informed consent was  obtained.      Marylynn Pearson, MD  Electronically Signed     GA/MEDQ  D:  12/17/2006  T:  12/18/2006  Job:  [id number]

## 2011-01-25 ENCOUNTER — Encounter: Payer: Self-pay | Admitting: Family Medicine

## 2011-01-25 ENCOUNTER — Ambulatory Visit (INDEPENDENT_AMBULATORY_CARE_PROVIDER_SITE_OTHER): Payer: 59 | Admitting: Family Medicine

## 2011-01-25 DIAGNOSIS — F329 Major depressive disorder, single episode, unspecified: Secondary | ICD-10-CM

## 2011-01-25 DIAGNOSIS — G43909 Migraine, unspecified, not intractable, without status migrainosus: Secondary | ICD-10-CM

## 2011-01-25 MED ORDER — SUMATRIPTAN 20 MG/ACT NA SOLN: 1.0000 | NASAL | Status: DC | PRN

## 2011-01-25 MED ORDER — NEBIVOLOL HCL 5 MG PO TABS: 5.0000 mg | ORAL_TABLET | Freq: Every day | ORAL | Status: DC

## 2011-01-25 MED ORDER — CITALOPRAM HYDROBROMIDE 20 MG PO TABS: ORAL_TABLET | ORAL | Status: DC

## 2011-01-25 NOTE — Assessment & Plan Note (Signed)
Mood worsened by stressors at work, having mostly irritability.  Will add Citalopram 10 mg/ day x 1 wk then increase to 20 mg/ day.  Call me if any problems.  RTC in 6 wks for f/u.

## 2011-01-25 NOTE — Patient Instructions (Signed)
Stop Nortriptyline and start Bystolic (samples) 5 mg tab once daily for migraine prevention (and BP reduction).  Call Dr Theda Sers re: cyst over ankle bone.  Use Imitrex Nasal up to 2 x a wk for migraine rescue.  Start Citalopram 10 mg once daily for irritability.  Increase to 20 mg once daily after the first wk.  Call me if any problems.  Return for f/u in 4 wks.

## 2011-01-25 NOTE — Assessment & Plan Note (Signed)
Migraines with reduced frequency on Pamelor but was having hangover SE.  Will stop this and change her to Bystolic 5 mg/ day for migraine prevention (and BP reductions since she is in the pre- HTN range).  She can use Imitrex nasal up to 2 x a wk for Rescue - RFd today.

## 2011-01-25 NOTE — Progress Notes (Signed)
  Subjective:    Patient ID: Denise Craig, female    DOB: Apr 27, 1971, 40 y.o.   MRN: [id number]  HPI  40 yo WF presents for f/u migraines.  She is still having them 1-2 x a wk but they aren't as bad.  She was started on Pamelor 25 mg qhs last visit but she is getting a hangover effect.  She is only using lasix only prn for leg swelling.  She has a new cyst over her lateral R ankle but denies trauma or pain.  She used to take Imitrex nasal for rescue which worked well but she ran out.  She c/o being irritable all the time and was on medication for depression in the past.  She has a good support system and is not a threat to herself.  She is not taking her iron everyday.  BP 139/83  Pulse 97  Wt 287 lb (130.182 kg)  SpO2 100%  LMP 11/21/2010   Review of Systems  Constitutional: Positive for fatigue. Negative for fever, appetite change and unexpected weight change.  Eyes: Negative for visual disturbance.  Respiratory: Negative for shortness of breath.   Cardiovascular: Positive for leg swelling. Negative for chest pain.  Neurological: Positive for headaches. Negative for dizziness.  Psychiatric/Behavioral: Positive for sleep disturbance, dysphoric mood and agitation. Negative for self-injury. The patient is nervous/anxious.        Objective:   Physical Exam  Constitutional: She appears well-developed and well-nourished. No distress.       obese  HENT:  Head: Normocephalic and atraumatic.  Eyes: No scleral icterus.  Neck: Neck supple. No thyromegaly present.  Cardiovascular: Normal rate and regular rhythm.   No murmur heard. Pulmonary/Chest: Effort normal and breath sounds normal. No respiratory distress. She has no wheezes.  Musculoskeletal: She exhibits edema.  Lymphadenopathy:    She has no cervical adenopathy.  Skin: Skin is warm and dry.       Grape sized soft cyst just behind the R lateral malleolus, non tender and freely moveable.  Psychiatric: She has a normal mood and  affect.          Assessment & Plan:  Soft tissue cyst over the R ankle x 1 wk w/o pain.  She agrees to call [employer] to follow up with this.

## 2011-02-27 ENCOUNTER — Ambulatory Visit (INDEPENDENT_AMBULATORY_CARE_PROVIDER_SITE_OTHER): Payer: 59 | Admitting: Family Medicine

## 2011-02-27 ENCOUNTER — Encounter: Payer: Self-pay | Admitting: Family Medicine

## 2011-02-27 DIAGNOSIS — E669 Obesity, unspecified: Secondary | ICD-10-CM

## 2011-02-27 DIAGNOSIS — F329 Major depressive disorder, single episode, unspecified: Secondary | ICD-10-CM

## 2011-02-27 DIAGNOSIS — I10 Essential (primary) hypertension: Secondary | ICD-10-CM

## 2011-02-27 DIAGNOSIS — R5381 Other malaise: Secondary | ICD-10-CM

## 2011-02-27 DIAGNOSIS — F3289 Other specified depressive episodes: Secondary | ICD-10-CM

## 2011-02-27 DIAGNOSIS — G43909 Migraine, unspecified, not intractable, without status migrainosus: Secondary | ICD-10-CM

## 2011-02-27 DIAGNOSIS — R5383 Other fatigue: Secondary | ICD-10-CM

## 2011-02-27 MED ORDER — NEBIVOLOL HCL 5 MG PO TABS: 5.0000 mg | ORAL_TABLET | Freq: Every day | ORAL | Status: DC

## 2011-02-27 MED ORDER — CITALOPRAM HYDROBROMIDE 40 MG PO TABS: 40.0000 mg | ORAL_TABLET | Freq: Every day | ORAL | Status: DC

## 2011-02-27 NOTE — Patient Instructions (Signed)
Increase Citalopram to 40 mg/ day and stay on other meds.  Will order a sleep study and will f/u results to look for sleep apnea.  Return for f/u mood/ BP in 2 mos.

## 2011-02-27 NOTE — Assessment & Plan Note (Signed)
PHQ 9 score of 9 today indicating some improvement in mood but needs to still be improved.  Will go up on her citalopram to 40 mg/ day and f/u in 6 wks.  I suggested getting a sleep study given her chronic sleep issues, obesity and high BP which may also be a contributing factor to her headaches and depression.

## 2011-02-27 NOTE — Assessment & Plan Note (Signed)
Improved on Bystolic.  Down to 4-5/ month with good results from Imitrex nasal.

## 2011-02-27 NOTE — Assessment & Plan Note (Signed)
Still a bit high today. c ontinue bystolic daily concurrently for migraine prevention.  Work on wt loss and get a sleep study.  F/u in 2 mos.

## 2011-02-27 NOTE — Progress Notes (Signed)
  Subjective:    Patient ID: Denise Craig, female    DOB: July 02, 1971, 40 y.o.   MRN: [id number]  HPI  40 yo obese WF presents for f/u depression.  She has done well on Citalopram but she is still having HAs about 4-5 in the past month but only one severe since starting Bystolic  6 wks ago for both migraine prevention and high BP.  She has trouble falling asleep and staying asleep.  She is always tired.  She does not always allow herself 8 hrs of sleep.  Has used the Imitrex nasal which has helped.  BP 141/85  Pulse 85  Ht 5' 6.5" (1.689 m)  Wt 285 lb (129.275 kg)  BMI 45.31 kg/m2  SpO2 98%  LMP 02/26/2011    Review of Systems  Constitutional: Negative for appetite change.  Neurological: Positive for headaches.  Psychiatric/Behavioral: Positive for sleep disturbance and dysphoric mood. The patient is nervous/anxious.        Objective:   Physical Exam  Constitutional: She appears well-developed and well-nourished. No distress.       Obese, smiling more  HENT:  Mouth/Throat: Oropharynx is clear and moist.  Neck: Neck supple. No thyromegaly present.  Cardiovascular: Normal rate and regular rhythm.   Pulmonary/Chest: Effort normal and breath sounds normal.  Psychiatric: She has a normal mood and affect.          Assessment & Plan:

## 2011-05-25 ENCOUNTER — Ambulatory Visit: Payer: 59 | Attending: Orthopedic Surgery | Admitting: Physical Therapy

## 2011-05-25 DIAGNOSIS — M25676 Stiffness of unspecified foot, not elsewhere classified: Secondary | ICD-10-CM | POA: Insufficient documentation

## 2011-05-25 DIAGNOSIS — M25673 Stiffness of unspecified ankle, not elsewhere classified: Secondary | ICD-10-CM | POA: Insufficient documentation

## 2011-05-25 DIAGNOSIS — M25579 Pain in unspecified ankle and joints of unspecified foot: Secondary | ICD-10-CM | POA: Insufficient documentation

## 2011-05-25 DIAGNOSIS — IMO0001 Reserved for inherently not codable concepts without codable children: Secondary | ICD-10-CM | POA: Insufficient documentation

## 2011-07-12 ENCOUNTER — Ambulatory Visit: Payer: 59 | Admitting: Family Medicine

## 2011-07-31 ENCOUNTER — Ambulatory Visit (INDEPENDENT_AMBULATORY_CARE_PROVIDER_SITE_OTHER): Payer: 59 | Admitting: Family Medicine

## 2011-07-31 ENCOUNTER — Encounter: Payer: Self-pay | Admitting: Family Medicine

## 2011-07-31 VITALS — BP 137/87 | HR 77 | Ht 66.5 in | Wt 293.0 lb

## 2011-07-31 DIAGNOSIS — F329 Major depressive disorder, single episode, unspecified: Secondary | ICD-10-CM

## 2011-07-31 DIAGNOSIS — J40 Bronchitis, not specified as acute or chronic: Secondary | ICD-10-CM

## 2011-07-31 DIAGNOSIS — I10 Essential (primary) hypertension: Secondary | ICD-10-CM

## 2011-07-31 DIAGNOSIS — F32A Depression, unspecified: Secondary | ICD-10-CM

## 2011-07-31 DIAGNOSIS — K219 Gastro-esophageal reflux disease without esophagitis: Secondary | ICD-10-CM

## 2011-07-31 MED ORDER — AZITHROMYCIN 250 MG PO TABS: ORAL_TABLET | ORAL | Status: AC

## 2011-07-31 MED ORDER — CITALOPRAM HYDROBROMIDE 40 MG PO TABS: 40.0000 mg | ORAL_TABLET | Freq: Every day | ORAL | Status: DC

## 2011-07-31 MED ORDER — FEXOFENADINE-PSEUDOEPHED ER 180-240 MG PO TB24: 1.0000 | ORAL_TABLET | Freq: Every day | ORAL | Status: DC

## 2011-07-31 MED ORDER — RABEPRAZOLE SODIUM 20 MG PO TBEC: 20.0000 mg | DELAYED_RELEASE_TABLET | Freq: Two times a day (BID) | ORAL | Status: DC

## 2011-07-31 MED ORDER — NEBIVOLOL HCL 5 MG PO TABS: 10.0000 mg | ORAL_TABLET | Freq: Every day | ORAL | Status: DC

## 2011-07-31 MED ORDER — BENZONATATE 100 MG PO CAPS: 100.0000 mg | ORAL_CAPSULE | Freq: Four times a day (QID) | ORAL | Status: DC | PRN

## 2011-07-31 NOTE — Patient Instructions (Signed)
Cough, Adult  A cough is a reflex that helps clear your throat and airways. It can help heal the body or may be a reaction to an irritated airway. A cough may only last 2 or 3 weeks (acute) or may last more than 8 weeks (chronic).  CAUSES Acute cough:  Viral or bacterial infections.  Chronic cough:  Infections.   Allergies.   Asthma.   Post-nasal drip.   Smoking.   Heartburn or acid reflux.   Some medicines.   Chronic lung problems (COPD).   Cancer.  SYMPTOMS   Cough.   Fever.   Chest pain.   Increased breathing rate.   High-pitched whistling sound when breathing (wheezing).   Colored mucus that you cough up (sputum).  TREATMENT   A bacterial cough may be treated with antibiotic medicine.   A viral cough must run its course and will not respond to antibiotics.   Your caregiver may recommend other treatments if you have a chronic cough.  HOME CARE INSTRUCTIONS   Only take over-the-counter or prescription medicines for pain, discomfort, or fever as directed by your caregiver. Use cough suppressants only as directed by your caregiver.   Use a cold steam vaporizer or humidifier in your bedroom or home to help loosen secretions.   Sleep in a semi-upright position if your cough is worse at night.   Rest as needed.   Stop smoking if you smoke.  SEEK IMMEDIATE MEDICAL CARE IF:   You have pus in your sputum.   Your cough starts to worsen.   You cannot control your cough with suppressants and are losing sleep.   You begin coughing up blood.   You have difficulty breathing.   You develop pain which is getting worse or is uncontrolled with medicine.   You have a fever.  MAKE SURE YOU:   Understand these instructions.   Will watch your condition.   Will get help right away if you are not doing well or get worse.  Document Released: 02/16/2011 Document Revised: 05/02/2011 Document Reviewed: 02/16/2011 [facility] Patient Information 2012 [place].Bronchitis Bronchitis is the body's way of reacting to injury and/or infection (inflammation) of the bronchi. Bronchi are the air tubes that extend from the windpipe into the lungs. If the inflammation becomes severe, it may cause shortness of breath. CAUSES  Inflammation may be caused by:  A virus.   Germs (bacteria).   Dust.   Allergens.   Pollutants and many other irritants.  The cells lining the bronchial tree are covered with tiny hairs (cilia). These constantly beat upward, away from the lungs, toward the mouth. This keeps the lungs free of pollutants. When these cells become too irritated and are unable to do their job, mucus begins to develop. This causes the characteristic cough of bronchitis. The cough clears the lungs when the cilia are unable to do their job. Without either of these protective mechanisms, the mucus would settle in the lungs. Then you would develop pneumonia. Smoking is a common cause of bronchitis and can contribute to pneumonia. Stopping this habit is the single most important thing you can do to help yourself. TREATMENT   Your caregiver may prescribe an antibiotic if the cough is caused by bacteria. Also, medicines that open up your airways make it easier to breathe. Your caregiver may also recommend or prescribe an expectorant. It will loosen the mucus to be coughed up. Only take over-the-counter or prescription medicines for pain, discomfort, or fever as directed by  your caregiver.   Removing whatever causes the problem (smoking, for example) is critical to preventing the problem from getting worse.   Cough suppressants may be prescribed for relief of cough symptoms.   Inhaled medicines may be prescribed to help with symptoms now and to help prevent problems from returning.   For those with recurrent (chronic) bronchitis, there may be a need for steroid medicines.  SEEK IMMEDIATE MEDICAL CARE IF:   During treatment, you develop more pus-like mucus  (purulent sputum).   You have a fever.   Your baby is older than 3 months with a rectal temperature of 102 F (38.9 C) or higher.   Your baby is 69 months old or younger with a rectal temperature of 100.4 F (38 C) or higher.   You become progressively more ill.   You have increased difficulty breathing, wheezing, or shortness of breath.  It is necessary to seek immediate medical care if you are elderly or sick from any other disease. MAKE SURE YOU:   Understand these instructions.   Will watch your condition.   Will get help right away if you are not doing well or get worse.  Document Released: 08/20/2005 Document Revised: 05/02/2011 Document Reviewed: 06/29/2008 [facility] Patient Information 2012 [place].

## 2011-07-31 NOTE — Progress Notes (Signed)
  Subjective:    Patient ID: Denise Craig, female    DOB: 10/30/1970, 40 y.o.   MRN: [id number]  Hypertension This is a new problem. The current episode started more than 1 year ago. The problem has been gradually improving since onset. Associated symptoms include anxiety and headaches. Pertinent negatives include no chest pain. Risk factors for coronary artery disease include obesity. Past treatments include direct vasodilators and beta blockers. The current treatment provides moderate improvement. Compliance problems include exercise, psychosocial issues and diet.  There is no history of angina or kidney disease.  Cough This is a new problem. The current episode started 1 to 4 weeks ago. The problem has been unchanged. The problem occurs hourly. The cough is non-productive. Associated symptoms include headaches, heartburn and a sore throat. Pertinent negatives include no chest pain, ear congestion, ear pain, fever, rash or wheezing. The symptoms are aggravated by stress. She has tried OTC cough suppressant for the symptoms. The treatment provided no relief. Her past medical history is significant for bronchitis.   #3 depressio continues to be an issue due to the stress of taking care of her mother and her feeling of inadequate support from his family #4 AcipHex twice a day seems to control her GERD she wants this refilled as well.    Review of Systems  Constitutional: Negative for fever.  HENT: Positive for sore throat. Negative for ear pain.   Respiratory: Positive for cough. Negative for wheezing.   Cardiovascular: Negative for chest pain.  Gastrointestinal: Positive for heartburn.  Skin: Negative for rash.  Neurological: Positive for headaches.  All other systems reviewed and are negative.       Objective:   Physical Exam  Constitutional: She is oriented to person, place, and time. She appears well-developed.       obese  HENT:  Head: Normocephalic.  Right Ear: External ear  normal.  Left Ear: External ear normal.  Neck: Normal range of motion. Neck supple.  Cardiovascular: Normal rate, regular rhythm and normal heart sounds.   Pulmonary/Chest: Effort normal and breath sounds normal.  Neurological: She is alert and oriented to person, place, and time.  Skin: Skin is warm and dry.  Psychiatric: She has a normal mood and affect.          Assessment & Plan:  #1Hypertension continue with the Bystolic but will increase from 5-10 mg #2 sinusitis/cough/bronchitis will treat with antibiotic Zithromax cough medicine Tessalon Perles and Allegra-D for the congestion. #3 GERD is under control with AcipHex we'll maintain AcipHex  Renewal prescription sent #4 depression she still had a lot of depression and stress from her family we'll maintain her on Celexa at this time.  Because change in blood pressure medicine we'll see her back in 2-3 months.

## 2011-09-04 HISTORY — PX: GASTRIC BYPASS: SHX52

## 2012-01-18 ENCOUNTER — Encounter: Payer: Self-pay | Admitting: Family Medicine

## 2012-01-18 ENCOUNTER — Ambulatory Visit (INDEPENDENT_AMBULATORY_CARE_PROVIDER_SITE_OTHER): Payer: 59 | Admitting: Family Medicine

## 2012-01-18 VITALS — BP 119/75 | HR 73 | Ht 66.5 in | Wt 298.0 lb

## 2012-01-18 DIAGNOSIS — F341 Dysthymic disorder: Secondary | ICD-10-CM

## 2012-01-18 DIAGNOSIS — Z23 Encounter for immunization: Secondary | ICD-10-CM

## 2012-01-18 DIAGNOSIS — F329 Major depressive disorder, single episode, unspecified: Secondary | ICD-10-CM

## 2012-01-18 DIAGNOSIS — F419 Anxiety disorder, unspecified: Secondary | ICD-10-CM

## 2012-01-18 DIAGNOSIS — F32A Depression, unspecified: Secondary | ICD-10-CM

## 2012-01-18 DIAGNOSIS — I10 Essential (primary) hypertension: Secondary | ICD-10-CM

## 2012-01-18 MED ORDER — LEVALBUTEROL TARTRATE 45 MCG/ACT IN AERO: 1.0000 | INHALATION_SPRAY | RESPIRATORY_TRACT | Status: DC | PRN

## 2012-01-18 MED ORDER — RABEPRAZOLE SODIUM 20 MG PO TBEC: 20.0000 mg | DELAYED_RELEASE_TABLET | Freq: Every day | ORAL | Status: DC

## 2012-01-18 MED ORDER — SERTRALINE HCL 50 MG PO TABS: 50.0000 mg | ORAL_TABLET | Freq: Every day | ORAL | Status: DC

## 2012-01-18 MED ORDER — NEBIVOLOL HCL 10 MG PO TABS: 10.0000 mg | ORAL_TABLET | Freq: Every day | ORAL | Status: DC

## 2012-01-18 MED ORDER — SUMATRIPTAN 20 MG/ACT NA SOLN: 1.0000 | NASAL | Status: DC | PRN

## 2012-01-18 NOTE — Progress Notes (Signed)
  Subjective:    Patient ID: Denise Craig, female    DOB: [birth year], 41 y.o.   MRN: [id number]  HPI Anxiety / dperssion - she says she is not tearful just feels irritable. Otherwise she is tolerating the citalopram well. She started taking 40 mg. No sleep issues. No depression or suicidal thoughts. Used ot be on effexor when her father was sick, a few years ago and did well on that as well.Marland Kitchen    HTN - Increased bystolic to 10mg  about 6 months ago. Tolerating well.    Review of Systems     Objective:   Physical Exam  Constitutional: She is oriented to person, place, and time. She appears well-developed and well-nourished.  HENT:  Head: Normocephalic and atraumatic.  Right Ear: External ear normal.  Left Ear: External ear normal.  Nose: Nose normal.  Mouth/Throat: Oropharynx is clear and moist.       TMs and canals are clear.   Eyes: Conjunctivae and EOM are normal. Pupils are equal, round, and reactive to light.  Neck: Neck supple. No thyromegaly present.  Cardiovascular: Normal rate, regular rhythm and normal heart sounds.   Pulmonary/Chest: Effort normal and breath sounds normal. She has no wheezes.  Lymphadenopathy:    She has no cervical adenopathy.  Neurological: She is alert and oriented to person, place, and time.  Skin: Skin is warm and dry.  Psychiatric: She has a normal mood and affect.          Assessment & Plan:  Anxiety/depression - phq - 9 scor of 5, GAD- 7 score of 6. Not at goal. After much discussion she decided to try a different medication. She is still struggling with irritability. We will try switching her to sertraline and see if that helps. Like her to followup in one month to make sure that she's doing well and so that we can adjust her dose as needed. I suspect she may need 100 mg to help control her anxiety symptoms. I did read out taper and switch for her.  HTN - Increased bystolic to 10mg  about 6 months ago. Tolerating well. Well controlled. Continue  current regimen. Followup in 6 months. BP Readings from Last 3 Encounters:  01/18/12 119/75  07/31/11 137/87  02/27/11 141/85

## 2012-01-18 NOTE — Patient Instructions (Signed)
Cut citalopram  In half. Take half a tab a day until run out.  Then start fluoxetine 20mg , 1/2 tab daily for one week, then increase to whole tab.

## 2012-01-29 LAB — COMPLETE METABOLIC PANEL WITH GFR
AST: 14 U/L (ref 0–37)
Albumin: 3.9 g/dL (ref 3.5–5.2)
Alkaline Phosphatase: 41 U/L (ref 39–117)
BUN: 13 mg/dL (ref 6–23)
GFR, Est Non African American: >89 mL/min
Potassium: 4.3 mEq/L (ref 3.5–5.3)
Sodium: 139 mEq/L (ref 135–145)
Total Bilirubin: 0.3 mg/dL (ref 0.3–1.2)
Total Protein: 6.1 g/dL (ref 6.0–8.3)

## 2012-01-29 LAB — LIPID PANEL
LDL Cholesterol: 127 mg/dL — ABNORMAL HIGH (ref 0–99)
Total CHOL/HDL Ratio: 4.3 Ratio
VLDL: 15 mg/dL (ref 0–40)

## 2012-02-22 ENCOUNTER — Other Ambulatory Visit: Payer: Self-pay | Admitting: *Deleted

## 2012-02-22 MED ORDER — SERTRALINE HCL 50 MG PO TABS: 50.0000 mg | ORAL_TABLET | Freq: Every day | ORAL | Status: DC

## 2012-03-19 ENCOUNTER — Ambulatory Visit: Payer: 59 | Admitting: Family Medicine

## 2012-03-26 ENCOUNTER — Encounter: Payer: Self-pay | Admitting: Family Medicine

## 2012-03-26 ENCOUNTER — Ambulatory Visit (INDEPENDENT_AMBULATORY_CARE_PROVIDER_SITE_OTHER): Payer: 59 | Admitting: Family Medicine

## 2012-03-26 VITALS — BP 127/79 | HR 74 | Ht 66.5 in | Wt 294.0 lb

## 2012-03-26 DIAGNOSIS — Z23 Encounter for immunization: Secondary | ICD-10-CM

## 2012-03-26 DIAGNOSIS — F329 Major depressive disorder, single episode, unspecified: Secondary | ICD-10-CM

## 2012-03-26 DIAGNOSIS — F32A Depression, unspecified: Secondary | ICD-10-CM

## 2012-03-26 DIAGNOSIS — F411 Generalized anxiety disorder: Secondary | ICD-10-CM

## 2012-03-26 DIAGNOSIS — F419 Anxiety disorder, unspecified: Secondary | ICD-10-CM

## 2012-03-26 MED ORDER — SERTRALINE HCL 50 MG PO TABS: 75.0000 mg | ORAL_TABLET | Freq: Every day | ORAL | Status: DC

## 2012-03-26 NOTE — Progress Notes (Signed)
  Subjective:    Patient ID: Denise Craig, female    DOB: 11/22/1970, 41 y.o.   MRN: [id number]  HPI Follow up anxiety and depression. We recently changed her to sertraline because she was still having some irritability on her medication. She is taking 50 mg daily. She's been on it for about 6 weeks at this point in time. She's been tolerating it well without any side effects. She is sleeping well. She says it does seem to work better but she still having some occasional irritability. Though she has a very stressful job being a Programmer, applications and says sometimes she just feels that her job gets to her.   Review of Systems     Objective:   Physical Exam  Constitutional: She is oriented to person, place, and time. She appears well-developed and well-nourished.  HENT:  Head: Normocephalic and atraumatic.  Cardiovascular: Normal rate, regular rhythm and normal heart sounds.   Pulmonary/Chest: Effort normal and breath sounds normal.  Neurological: She is alert and oriented to person, place, and time.  Skin: Skin is warm and dry.  Psychiatric: She has a normal mood and affect. Her behavior is normal.          Assessment & Plan:  Depresion /Anxiety - GAD-7 score of 6.  PHQ- 9 score of 4. We discussed the option of keeping her medication where it is or increasing her dose. She would like to try increasing her dose. We can always go back down if she is not responding to it or if she has side effects on the higher dose. Will inc sertaline to 75mg  daily. F/U in 6-8 weeks.   She does have a GYN I reminded her to make sure she gets her mammogram and Pap smear. She does have a family history of breast cancer and is due for her mammogram.  Asthma-pneumonia vaccine given today.

## 2012-03-27 ENCOUNTER — Encounter: Payer: Self-pay | Admitting: Family Medicine

## 2012-05-07 ENCOUNTER — Other Ambulatory Visit: Payer: Self-pay | Admitting: Family Medicine

## 2012-05-12 DIAGNOSIS — Z903 Acquired absence of stomach [part of]: Secondary | ICD-10-CM

## 2012-05-12 HISTORY — DX: Acquired absence of stomach (part of): Z90.3

## 2012-05-13 ENCOUNTER — Encounter: Payer: Self-pay | Admitting: Family Medicine

## 2012-05-21 ENCOUNTER — Ambulatory Visit (INDEPENDENT_AMBULATORY_CARE_PROVIDER_SITE_OTHER): Payer: 59 | Admitting: Physician Assistant

## 2012-05-21 ENCOUNTER — Ambulatory Visit (INDEPENDENT_AMBULATORY_CARE_PROVIDER_SITE_OTHER): Payer: 59 | Admitting: Family Medicine

## 2012-05-21 ENCOUNTER — Encounter: Payer: Self-pay | Admitting: Family Medicine

## 2012-05-21 ENCOUNTER — Ambulatory Visit: Payer: 59 | Admitting: Family Medicine

## 2012-05-21 VITALS — BP 111/78 | HR 64 | Ht 66.5 in | Wt 277.0 lb

## 2012-05-21 DIAGNOSIS — I10 Essential (primary) hypertension: Secondary | ICD-10-CM

## 2012-05-21 NOTE — Progress Notes (Signed)
CC: Denise Craig is a 41 y.o. female is here for Hypertension   Subjective: HPI:  Patient presents due to concerns of blood pressure. She's currently taking diastolic 10 mg a daily basis and has been for months. She had gastric bypass surgery the week and half ago and was told by her surgery team to contact us for blood pressure management. She brings in a blood pressure diary reflecting excellent blood pressure control in the past 3 days except for one systolic reading at [id number]. Pulse is been in the range of the high 50s and low 60s. She denies any dizziness, vision loss, motor sensory disturbances, nor unsteadiness from seated to standing nor at any time during the day. She denies any exercise intolerance with respect to fatigue. She denies any chest pain, shortness of breath, orthopnea, peripheral edema, regular heartbeat. She reports taking diastolic for hypertension no other issues.   Review Of Systems Outlined In HPI  Past Medical History  Diagnosis Date  . Infertility, female   . Uterine polyp   . Migraine     sleep problems- sees Dr Berdine Addison, EEG normal 12-07  . GERD (gastroesophageal reflux disease)     w/ moderate hiatal hernia (EGD 3-09), Dr Sharlett Iles  . Iron deficiency   . B12 deficiency   . 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 Hypertension Mother   . Diabetes Mother   . Cancer Father     metastatic, colon ca  . Breast cancer Mother      History  Substance Use Topics  . Smoking status: Never Smoker   . Smokeless tobacco: Not on file  . Alcohol Use: Yes     occasional     Objective: Filed Vitals:   05/21/12 1032  BP: 111/78  Pulse: 64    General: Alert and Oriented, No Acute Distress Lungs: Clear to auscultation bilaterally, no wheezing/ronchi/rales.  Comfortable work of breathing. Good air movement. Cardiac: Regular rate and rhythm. Normal S1/S2.  No murmurs, rubs, nor gallops.  . Extremities: No  peripheral edema.  Strong peripheral pulses.    Assessment & Plan: Denise Craig was seen today for hypertension.  Diagnoses and associated orders for this visit:  Essential hypertension, benign    She will use a pill cutter to get 5 mg bystolic on a daily basis to see her blood pressure continues to be well-controlled at that dosage, discussed but eventually will have to titrate her down for complete cessation and this will be an attempt at a first step as well. 2 over signs and symptoms that would suggest hypertension or more important hypotension and would require her to go back to her 10 mg regimen. She's a daily diary of today pressure readings and pulses she'll drop off on Wednesday to determine her response.  Return in about 1 week (around 05/28/2012).  Requested Prescriptions    No prescriptions requested or ordered in this encounter

## 2012-06-06 ENCOUNTER — Telehealth: Payer: Self-pay | Admitting: Family Medicine

## 2012-06-06 NOTE — Telephone Encounter (Signed)
Contacted patient, she states she is off by bystolic completely an average blood pressures are 120/70-80. She'll continue off of bystolic unless blood pressures return to stage I hypertension.Marland Kitchen

## 2012-07-03 ENCOUNTER — Other Ambulatory Visit: Payer: Self-pay | Admitting: Obstetrics and Gynecology

## 2012-07-03 DIAGNOSIS — Z803 Family history of malignant neoplasm of breast: Secondary | ICD-10-CM

## 2012-07-10 ENCOUNTER — Other Ambulatory Visit: Payer: Self-pay | Admitting: Obstetrics and Gynecology

## 2012-07-24 ENCOUNTER — Ambulatory Visit (INDEPENDENT_AMBULATORY_CARE_PROVIDER_SITE_OTHER): Payer: 59 | Admitting: Family Medicine

## 2012-07-24 ENCOUNTER — Encounter: Payer: Self-pay | Admitting: Family Medicine

## 2012-07-24 VITALS — BP 148/93 | HR 88 | Wt 233.0 lb

## 2012-07-24 DIAGNOSIS — I10 Essential (primary) hypertension: Secondary | ICD-10-CM

## 2012-07-24 DIAGNOSIS — F411 Generalized anxiety disorder: Secondary | ICD-10-CM

## 2012-07-24 MED ORDER — BUSPIRONE HCL 7.5 MG PO TABS: 7.5000 mg | ORAL_TABLET | Freq: Two times a day (BID) | ORAL | Status: DC

## 2012-07-24 NOTE — Progress Notes (Signed)
CC: Denise Craig is a 41 y.o. female is here for Depression   Subjective: HPI:  Patient presents for concerns of anxiety and irritability. 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 She describes anxiety as a stressed sensation. She's been on Zoloft for 6 months and does not feel that it's handled any of these emotions since starting and recently increasing to 75 mg. Other than work nothing seems to make it worse, is completely relieved when she comes home. She thinks her mother's declining health and her mother's reluctance to take medical advice is adding to the stress. Patient denies depression, paranoia, manic episodes, sadness, sleep disturbance, nor change in appetite. She denies any stressors at home, she has the support of a loving husband.  She's been on citalopram in the past without benefit, she's tried Effexor but felt she was unable to experience emotions  Hypertension: At her last visit we devised a regimen for her to stop bystolic.  She reported normotensive blood pressures from home and has been off of this for weeks. She had a additional doctors over this morning with a blood pressure of 135/70 reportedly. She has no other outside blood pressures to report. She denies chest pain, shortness of breath, orthopnea, peripheral edema, motor or sensory disturbances, nor change in salt intake, she is not a formal exercise program.    Review Of Systems Outlined In HPI  Past Medical History  Diagnosis Date  . Infertility, female   . Uterine polyp   . Migraine     sleep problems- sees Dr Berdine Addison, EEG normal 12-07  . GERD (gastroesophageal reflux disease)     w/ moderate hiatal hernia (EGD 3-09), Dr Sharlett Iles  . Iron deficiency   . B12 deficiency   . 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 Hypertension Mother   . Diabetes Mother   . Cancer Father     metastatic, colon ca  . Breast  cancer Mother      History  Substance Use Topics  . Smoking status: Never Smoker   . Smokeless tobacco: Not on file  . Alcohol Use: Yes     Comment: occasional     Objective: Filed Vitals:   07/24/12 1013  BP: 148/93  Pulse: 88    General: Alert and Oriented, No Acute Distress HEENT: Pupils equal, round, reactive to light. Conjunctivae clear.   Lungs: Clear to auscultation bilaterally, no wheezing/ronchi/rales.  Comfortable work of breathing. Good air movement. Cardiac: Regular rate and rhythm. Normal S1/S2.  No murmurs, rubs, nor gallops.   Extremities: No peripheral edema.  Mental Status: No  gross depression, anxiety, nor agitation. Skin: Warm and dry.  Assessment & Plan: Denise Craig was seen today for depression.  Diagnoses and associated orders for this visit:  Anxiety - busPIRone (BUSPAR) 7.5 MG tablet; Take 1 tablet (7.5 mg total) by mouth 2 (two) times daily.  Essential hypertension  Hypertension: Uncontrolled, I like her to return in 3-4 weeks primarily for anxiety followup but if blood pressure remains elevated will consider restartingbystolic. Anxiety: Uncontrolled Please see patient handout for taper off Zoloft and starting BuSpar.     Return in about 4 weeks (around 08/21/2012).

## 2012-07-24 NOTE — Patient Instructions (Signed)
Zoloft to Buspar Transition Day 1-4: 25mg  zoloft daily, 7.5mg  Buspar daily Day 5-8: No zoloft, 7.5mg  Buspar twice a day Return in 3-4 weeks for Blood Pressure and Anxiety Follow up.

## 2012-08-02 ENCOUNTER — Encounter (HOSPITAL_COMMUNITY): Payer: Self-pay | Admitting: Pharmacist

## 2012-08-06 ENCOUNTER — Encounter (HOSPITAL_COMMUNITY)
Admission: RE | Admit: 2012-08-06 | Discharge: 2012-08-06 | Disposition: A | Discharge status: Home / Self Care | Payer: 59 | Source: Ambulatory Visit | Attending: Obstetrics and Gynecology | Admitting: Obstetrics and Gynecology

## 2012-08-06 ENCOUNTER — Ambulatory Visit
Admission: RE | Admit: 2012-08-06 | Discharge: 2012-08-06 | Disposition: A | Discharge status: Home / Self Care | Payer: 59 | Source: Ambulatory Visit | Attending: Obstetrics and Gynecology | Admitting: Obstetrics and Gynecology

## 2012-08-06 ENCOUNTER — Encounter (HOSPITAL_COMMUNITY): Payer: Self-pay

## 2012-08-06 ENCOUNTER — Encounter (HOSPITAL_COMMUNITY): Payer: Self-pay | Admitting: Pharmacy Technician

## 2012-08-06 DIAGNOSIS — Z803 Family history of malignant neoplasm of breast: Secondary | ICD-10-CM

## 2012-08-06 HISTORY — DX: Other specified postprocedural states: Z98.890

## 2012-08-06 HISTORY — DX: Nausea with vomiting, unspecified: R11.2

## 2012-08-06 HISTORY — DX: Anxiety disorder, unspecified: F41.9

## 2012-08-06 HISTORY — DX: Unspecified asthma, uncomplicated: J45.909

## 2012-08-06 HISTORY — DX: Depression, unspecified: F32.A

## 2012-08-06 HISTORY — DX: Other specified postprocedural states: R11.2

## 2012-08-06 HISTORY — DX: Major depressive disorder, single episode, unspecified: F32.9

## 2012-08-06 MED ORDER — GADOBENATE DIMEGLUMINE 529 MG/ML IV SOLN
19.0000 mL | Freq: Once | INTRAVENOUS | Status: AC | PRN
  Administered 2012-08-06: 19 mL via INTRAVENOUS

## 2012-08-06 NOTE — Patient Instructions (Addendum)
   Your procedure is scheduled TD:[id number] December 10th  Enter through the Main Entrance of [facility] at:6am Pick up the phone at the desk and dial [phone] and inform us of your arrival.  Please call this number if you have any problems the morning of surgery: [phone]  Remember: Do not eat or drink after midnight on Monday Please take your Buspar  in the morning day of surgery with sips of water Please bring your inhaler with you day of surgery  Do not wear jewelry, make-up, or FINGER nail polish No metal in your hair or on your body. Do not wear lotions, powders, perfumes. You may wear deodorant.  Please use your CHG wash as directed prior to surgery.  Do not shave anywhere for at least 12 hours prior to first CHG shower.  Do not bring valuables to the hospital.      Patients discharged on the day of surgery will not be allowed to drive home.

## 2012-08-11 MED ORDER — DEXTROSE 5 % IV SOLN
2.0000 g | INTRAVENOUS | Status: AC
  Administered 2012-08-12: 2 g via INTRAVENOUS
  Filled 2012-08-11: qty 2

## 2012-08-11 NOTE — H&P (Addendum)
41 yo presents for surgical mngt of menorrhagia.  Pt also desires permanent sterilization.  PMHx:  Anxiety, CHTN, Asthma, Migraines PSHx:  Gastric bypass 9/13 All:  None Meds:  Buspar, aetigal, prilosec, imitrex, xopenex, portia, lotran, phenergan/zofran prn SHX:  Strong FHx breast ca, uterine, lung, colon ca SHx:  No tobacco  AF, VSS Gen - NAD Abd - soft, NT/ND CV - RRR Lungs - CTAB Ext - NT, no edema  [place] - normal uterus without intracavitary masses, normal ovaries bilaterally EmBx - benign  A/P:  Menorrhagia, desires sterilization Laparoscopic BTL, hysteroscopy, D&C, endometrial ablation  Plan of care rediscussed, questions answered, informed consent.

## 2012-08-12 ENCOUNTER — Encounter (HOSPITAL_COMMUNITY): Payer: Self-pay | Admitting: Anesthesiology

## 2012-08-12 ENCOUNTER — Ambulatory Visit (HOSPITAL_COMMUNITY): Payer: 59 | Admitting: Anesthesiology

## 2012-08-12 ENCOUNTER — Encounter (HOSPITAL_COMMUNITY)
Admission: RE | Disposition: A | Discharge status: Home / Self Care | Payer: Self-pay | Source: Ambulatory Visit | Attending: Obstetrics and Gynecology

## 2012-08-12 ENCOUNTER — Ambulatory Visit (HOSPITAL_COMMUNITY)
Admission: RE | Admit: 2012-08-12 | Discharge: 2012-08-12 | Disposition: A | Discharge status: Home / Self Care | Payer: 59 | Source: Ambulatory Visit | Attending: Obstetrics and Gynecology | Admitting: Obstetrics and Gynecology

## 2012-08-12 DIAGNOSIS — Z302 Encounter for sterilization: Secondary | ICD-10-CM | POA: Insufficient documentation

## 2012-08-12 DIAGNOSIS — Z01812 Encounter for preprocedural laboratory examination: Secondary | ICD-10-CM | POA: Insufficient documentation

## 2012-08-12 DIAGNOSIS — N920 Excessive and frequent menstruation with regular cycle: Secondary | ICD-10-CM | POA: Insufficient documentation

## 2012-08-12 DIAGNOSIS — Z01818 Encounter for other preprocedural examination: Secondary | ICD-10-CM | POA: Insufficient documentation

## 2012-08-12 HISTORY — PX: LAPAROSCOPIC TUBAL LIGATION: SHX1937

## 2012-08-12 HISTORY — PX: HYSTEROSCOPY WITH NOVASURE: SHX5574

## 2012-08-12 SURGERY — LIGATION, FALLOPIAN TUBE, LAPAROSCOPIC
Anesthesia: General | Site: Vagina | Wound class: Clean

## 2012-08-12 MED ORDER — LIDOCAINE HCL (CARDIAC) 20 MG/ML IV SOLN
INTRAVENOUS | Status: DC | PRN
  Administered 2012-08-12: 100 mg via INTRAVENOUS

## 2012-08-12 MED ORDER — DEXAMETHASONE SODIUM PHOSPHATE 4 MG/ML IJ SOLN
INTRAMUSCULAR | Status: DC | PRN
  Administered 2012-08-12: 10 mg via INTRAVENOUS

## 2012-08-12 MED ORDER — FENTANYL CITRATE 0.05 MG/ML IJ SOLN
INTRAMUSCULAR | Status: AC
  Filled 2012-08-12: qty 5

## 2012-08-12 MED ORDER — KETOROLAC TROMETHAMINE 30 MG/ML IJ SOLN
INTRAMUSCULAR | Status: AC
  Administered 2012-08-12: 30 mg via INTRAMUSCULAR
  Filled 2012-08-12: qty 1

## 2012-08-12 MED ORDER — BUPIVACAINE HCL (PF) 0.25 % IJ SOLN
INTRAMUSCULAR | Status: DC | PRN
  Administered 2012-08-12: 3 mL

## 2012-08-12 MED ORDER — LACTATED RINGERS IV SOLN
INTRAVENOUS | Status: DC
  Administered 2012-08-12 (×2): via INTRAVENOUS

## 2012-08-12 MED ORDER — HYDROCODONE-ACETAMINOPHEN 5-325 MG PO TABS
ORAL_TABLET | ORAL | Status: AC
  Administered 2012-08-12: 1 via ORAL
  Filled 2012-08-12: qty 1

## 2012-08-12 MED ORDER — FENTANYL CITRATE 0.05 MG/ML IJ SOLN
INTRAMUSCULAR | Status: AC
  Administered 2012-08-12: 50 ug via INTRAVENOUS
  Filled 2012-08-12: qty 2

## 2012-08-12 MED ORDER — ROCURONIUM BROMIDE 50 MG/5ML IV SOLN
INTRAVENOUS | Status: AC
  Filled 2012-08-12: qty 1

## 2012-08-12 MED ORDER — MEPERIDINE HCL 25 MG/ML IJ SOLN: 6.2500 mg | INTRAMUSCULAR | Status: DC | PRN

## 2012-08-12 MED ORDER — LIDOCAINE HCL (CARDIAC) 20 MG/ML IV SOLN
INTRAVENOUS | Status: AC
  Filled 2012-08-12: qty 5

## 2012-08-12 MED ORDER — GLYCOPYRROLATE 0.2 MG/ML IJ SOLN
INTRAMUSCULAR | Status: DC | PRN
  Administered 2012-08-12: 0.6 mg via INTRAVENOUS

## 2012-08-12 MED ORDER — ONDANSETRON HCL 4 MG/2ML IJ SOLN
INTRAMUSCULAR | Status: DC | PRN
  Administered 2012-08-12: 4 mg via INTRAVENOUS

## 2012-08-12 MED ORDER — DEXAMETHASONE SODIUM PHOSPHATE 10 MG/ML IJ SOLN
INTRAMUSCULAR | Status: AC
  Filled 2012-08-12: qty 1

## 2012-08-12 MED ORDER — NEOSTIGMINE METHYLSULFATE 1 MG/ML IJ SOLN
INTRAMUSCULAR | Status: AC
  Filled 2012-08-12: qty 10

## 2012-08-12 MED ORDER — HYDROCODONE-ACETAMINOPHEN 5-500 MG PO TABS: 1.0000 | ORAL_TABLET | ORAL | Status: DC | PRN

## 2012-08-12 MED ORDER — SCOPOLAMINE 1 MG/3DAYS TD PT72
MEDICATED_PATCH | TRANSDERMAL | Status: AC
  Administered 2012-08-12: 1.5 mg via TRANSDERMAL
  Filled 2012-08-12: qty 1

## 2012-08-12 MED ORDER — LIDOCAINE HCL 1 % IJ SOLN
INTRAMUSCULAR | Status: DC | PRN
  Administered 2012-08-12: 8 mL

## 2012-08-12 MED ORDER — MIDAZOLAM HCL 5 MG/5ML IJ SOLN
INTRAMUSCULAR | Status: DC | PRN
  Administered 2012-08-12: 2 mg via INTRAVENOUS

## 2012-08-12 MED ORDER — ONDANSETRON HCL 4 MG/2ML IJ SOLN
INTRAMUSCULAR | Status: AC
  Filled 2012-08-12: qty 2

## 2012-08-12 MED ORDER — HYDROMORPHONE HCL PF 1 MG/ML IJ SOLN
INTRAMUSCULAR | Status: AC
  Filled 2012-08-12: qty 1

## 2012-08-12 MED ORDER — ROCURONIUM BROMIDE 100 MG/10ML IV SOLN
INTRAVENOUS | Status: DC | PRN
  Administered 2012-08-12: 50 mg via INTRAVENOUS

## 2012-08-12 MED ORDER — GLYCOPYRROLATE 0.2 MG/ML IJ SOLN
INTRAMUSCULAR | Status: AC
  Filled 2012-08-12: qty 2

## 2012-08-12 MED ORDER — METOCLOPRAMIDE HCL 5 MG/ML IJ SOLN: 10.0000 mg | Freq: Once | INTRAMUSCULAR | Status: DC | PRN

## 2012-08-12 MED ORDER — LACTATED RINGERS IV SOLN
INTRAVENOUS | Status: DC | PRN
  Administered 2012-08-12: 3000 mL via INTRAUTERINE

## 2012-08-12 MED ORDER — SCOPOLAMINE 1 MG/3DAYS TD PT72
1.0000 | MEDICATED_PATCH | TRANSDERMAL | Status: DC
  Administered 2012-08-12: 1.5 mg via TRANSDERMAL

## 2012-08-12 MED ORDER — PROPOFOL 10 MG/ML IV EMUL
INTRAVENOUS | Status: DC | PRN
  Administered 2012-08-12: 120 mg via INTRAVENOUS
  Administered 2012-08-12: 50 mg via INTRAVENOUS
  Administered 2012-08-12: 180 mg via INTRAVENOUS

## 2012-08-12 MED ORDER — BUPIVACAINE HCL (PF) 0.25 % IJ SOLN
INTRAMUSCULAR | Status: AC
  Filled 2012-08-12: qty 30

## 2012-08-12 MED ORDER — FENTANYL CITRATE 0.05 MG/ML IJ SOLN
25.0000 ug | INTRAMUSCULAR | Status: DC | PRN
  Administered 2012-08-12 (×4): 50 ug via INTRAVENOUS

## 2012-08-12 MED ORDER — HYDROCODONE-ACETAMINOPHEN 5-325 MG PO TABS
1.0000 | ORAL_TABLET | Freq: Once | ORAL | Status: AC
  Administered 2012-08-12: 1 via ORAL

## 2012-08-12 MED ORDER — HYDROMORPHONE HCL PF 1 MG/ML IJ SOLN
INTRAMUSCULAR | Status: DC | PRN
  Administered 2012-08-12: 1 mg via INTRAVENOUS
  Administered 2012-08-12: .5 mg via INTRAVENOUS

## 2012-08-12 MED ORDER — KETOROLAC TROMETHAMINE 30 MG/ML IJ SOLN
INTRAMUSCULAR | Status: AC
  Filled 2012-08-12: qty 1

## 2012-08-12 MED ORDER — KETOROLAC TROMETHAMINE 30 MG/ML IJ SOLN
INTRAMUSCULAR | Status: DC | PRN
  Administered 2012-08-12: 30 mg via INTRAVENOUS

## 2012-08-12 MED ORDER — MIDAZOLAM HCL 2 MG/2ML IJ SOLN
INTRAMUSCULAR | Status: AC
  Filled 2012-08-12: qty 2

## 2012-08-12 MED ORDER — NEOSTIGMINE METHYLSULFATE 1 MG/ML IJ SOLN
INTRAMUSCULAR | Status: DC | PRN
  Administered 2012-08-12: 4 mg via INTRAVENOUS

## 2012-08-12 MED ORDER — HYDROMORPHONE HCL PF 1 MG/ML IJ SOLN
INTRAMUSCULAR | Status: AC
  Administered 2012-08-12: 1 mg
  Filled 2012-08-12: qty 1

## 2012-08-12 MED ORDER — FENTANYL CITRATE 0.05 MG/ML IJ SOLN
INTRAMUSCULAR | Status: DC | PRN
  Administered 2012-08-12: 150 ug via INTRAVENOUS
  Administered 2012-08-12 (×2): 50 ug via INTRAVENOUS

## 2012-08-12 SURGICAL SUPPLY — 29 items
ABLATOR ENDOMETRIAL BIPOLAR (ABLATOR) ×4 IMPLANT
ADH SKN CLS APL DERMABOND .7 (GAUZE/BANDAGES/DRESSINGS) ×1
CATH ROBINSON RED A/P 16FR (CATHETERS) ×4 IMPLANT
CHLORAPREP W/TINT 26ML (MISCELLANEOUS) ×4 IMPLANT
CLIP FILSHIE TUBAL LIGA STRL (Clip) ×4 IMPLANT
CLOTH BEACON ORANGE TIMEOUT ST (SAFETY) ×4 IMPLANT
CONTAINER PREFILL 10% NBF 60ML (FORM) ×4 IMPLANT
DERMABOND ADVANCED (GAUZE/BANDAGES/DRESSINGS) ×1
DERMABOND ADVANCED .7 DNX12 (GAUZE/BANDAGES/DRESSINGS) ×3 IMPLANT
DRESSING TELFA 8X3 (GAUZE/BANDAGES/DRESSINGS) ×4 IMPLANT
GLOVE BIO SURGEON STRL SZ 6.5 (GLOVE) ×4 IMPLANT
GLOVE BIOGEL PI IND STRL 7.0 (GLOVE) ×9 IMPLANT
GLOVE BIOGEL PI INDICATOR 7.0 (GLOVE) ×3
GOWN PREVENTION PLUS LG XLONG (DISPOSABLE) ×8 IMPLANT
GOWN STRL REIN XL XLG (GOWN DISPOSABLE) ×8 IMPLANT
PACK HYSTEROSCOPY LF (CUSTOM PROCEDURE TRAY) ×4 IMPLANT
PACK LAPAROSCOPY BASIN (CUSTOM PROCEDURE TRAY) ×4 IMPLANT
PAD OB MATERNITY 4.3X12.25 (PERSONAL CARE ITEMS) ×4 IMPLANT
SET IRRIG TUBING LAPAROSCOPIC (IRRIGATION / IRRIGATOR) IMPLANT
SLEEVE Z-THREAD 5X100MM (TROCAR) IMPLANT
SUT VIC AB 3-0 PS2 18 (SUTURE) ×1
SUT VIC AB 3-0 PS2 18XBRD (SUTURE) ×3 IMPLANT
SUT VICRYL 0 UR6 27IN ABS (SUTURE) ×4 IMPLANT
TOWEL OR 17X24 6PK STRL BLUE (TOWEL DISPOSABLE) ×8 IMPLANT
TROCAR 12M 150ML BLUNT (TROCAR) ×4 IMPLANT
TROCAR Z-THREAD BLADED 5X100MM (TROCAR) ×4 IMPLANT
TROCAR Z-THREAD FIOS 11X100 BL (TROCAR) ×4 IMPLANT
WARMER LAPAROSCOPE (MISCELLANEOUS) ×4 IMPLANT
WATER STERILE IRR 1000ML POUR (IV SOLUTION) IMPLANT

## 2012-08-12 NOTE — Anesthesia Postprocedure Evaluation (Signed)
Anesthesia Post Note  Patient: Denise Craig  Procedure(s) Performed: Procedure(s) (LRB): LAPAROSCOPIC TUBAL LIGATION (Bilateral) HYSTEROSCOPY WITH NOVASURE (N/A)  Anesthesia type: General  Patient location: PACU  Post pain: Pain level controlled  Post assessment: Post-op Vital signs reviewed  Last Vitals:  Filed Vitals:   08/12/12 1045  BP: 150/80  Pulse: 71  Temp: 36.9 C  Resp: 24    Post vital signs: Reviewed  Level of consciousness: sedated  Complications: No apparent anesthesia complications

## 2012-08-12 NOTE — Progress Notes (Signed)
Spoke to dr. Primitivo Gauze regarding pt's blood pressure. Instructed to give more pain medication at this time. Will continue to monitor patient.

## 2012-08-12 NOTE — Anesthesia Preprocedure Evaluation (Addendum)
Anesthesia Evaluation  Patient identified by MRN, date of birth, ID band Patient awake    Reviewed: Allergy & Precautions, H&P , NPO status , Patient's Chart, lab work & pertinent test results  History of Anesthesia Complications (+) PONV  Airway Mallampati: III TM Distance: >3 FB Neck ROM: Full    Dental No notable dental hx. (+) Teeth Intact   Pulmonary asthma ,  breath sounds clear to auscultation  Pulmonary exam normal       Cardiovascular hypertension, Rhythm:Regular Rate:Normal     Neuro/Psych  Headaches, PSYCHIATRIC DISORDERS Anxiety Depression    GI/Hepatic Neg liver ROS, hiatal hernia, GERD-  Medicated and Controlled,S/P Gastric Bypass   Endo/Other  Morbid obesity  Renal/GU negative Renal ROS  negative genitourinary   Musculoskeletal negative musculoskeletal ROS (+)   Abdominal Normal abdominal exam  (+) + obese,   Peds  Hematology negative hematology ROS (+)   Anesthesia Other Findings   Reproductive/Obstetrics Desires Sterilization Menorrhagia                          Anesthesia Physical Anesthesia Plan  ASA: III  Anesthesia Plan: General   Post-op Pain Management:    Induction: Intravenous  Airway Management Planned: Oral ETT  Additional Equipment:   Intra-op Plan:   Post-operative Plan: Extubation in OR  Informed Consent: I have reviewed the patients History and Physical, chart, labs and discussed the procedure including the risks, benefits and alternatives for the proposed anesthesia with the patient or authorized representative who has indicated his/her understanding and acceptance.   Dental advisory given  Plan Discussed with: CRNA, Anesthesiologist and Surgeon  Anesthesia Plan Comments:         Anesthesia Quick Evaluation

## 2012-08-12 NOTE — Transfer of Care (Signed)
Immediate Anesthesia Transfer of Care Note  Patient: Denise Craig  Procedure(s) Performed: Procedure(s) (LRB) with comments: LAPAROSCOPIC TUBAL LIGATION (Bilateral) - WITH FILSHIE CLIPS HYSTEROSCOPY WITH NOVASURE (N/A)  Patient Location: PACU  Anesthesia Type:General  Level of Consciousness: awake, alert  and oriented  Airway & Oxygen Therapy: Patient Spontanous Breathing and Patient connected to nasal cannula oxygen  Post-op Assessment: Report given to PACU RN and Post -op Vital signs reviewed and stable  Post vital signs: stable  Complications: No apparent anesthesia complications

## 2012-08-12 NOTE — Op Note (Signed)
[name], Denise Craig                 ACCOUNT NO.:  [id number]  MEDICAL RECORD NO.:  [id number]  LOCATION:  [facility]                          FACILITY:  [facility]  PHYSICIAN:  Marylynn Pearson, MD    DATE OF BIRTH:  [birth year]  DATE OF PROCEDURE:  08/12/2012 DATE OF DISCHARGE:  08/12/2012                              OPERATIVE REPORT   PREOPERATIVE DIAGNOSES: 1. Menorrhagia. 2. Desires permanent sterilization.  POSTOPERATIVE DIAGNOSES: 1. Menorrhagia. 2. Desires permanent sterilization.  PROCEDURE: 1. Laparoscopic tubal ligation with Filshie clips. 2. Cervical block. 3. Hysteroscopy. 4. NovaSure endometrial ablation.  SURGEON:  Marylynn Pearson, MD  ASSISTANT:  None.  ANESTHESIA:  General.  COMPLICATIONS:  None.  CONDITION:  Stable to recovery room.  PROCEDURE:  The patient was taken to the operating room, where general anesthesia was obtained.  She was placed in the dorsal lithotomy position using Allen stirrups, prepped and draped in sterile fashion. An in and out catheter was used to drain her bladder.  Bivalve speculum was placed in the vagina and a single-tooth tenaculum on the anterior lip of the cervix.  Acorn tenaculum was placed.  Speculum was removed and our attention was turned to the abdomen.  Marcaine 0.25% was used to provide local anesthesia at the site of our infraumbilical incision.  Incision was made with a scalpel and extended bluntly to the level of the fascia using a Kelly clamp.  Optical trocar was inserted under direct visualization.  Once intraperitoneal placement was confirmed, CO2 was turned on.  Bilateral fallopian tubes were identified and Filshie clips were applied bilaterally.  Trocars and instruments were removed from the abdomen.  A deep stitch was placed in the infraumbilical skin incision.  The skin was closed with Vicryl. Dermabond was placed and our attention was turned to the vagina.  Tenaculum and acorn were removed.  Bivalve speculum was  placed in the vagina and a single-tooth tenaculum on the anterior lip of the cervix, 8 mL of 1% Xylocaine was used to provide a cervical block.  The cervix was then serially dilated using Pratt dilators.  The diagnostic hysteroscope was inserted and a survey of the cavity was performed.  Bilateral ostia were identified and appeared normal.  The cavity appeared normal without masses, polyps, or lesions.  Hysteroscope was removed.  The uterus was sounded and the cervix was measured.  NovaSure was performed using standard manufacture guidelines.  Once the cycle was complete, the device was allowed to cool and then removed from the uterus.  The tenaculum was removed.  The cervix was hemostatic.  Speculum was removed.  She was taken to the recovery room in stable condition. Sponge, lap, needle, and instrument counts were correct x2.     Marylynn Pearson, MD     GA/MEDQ  D:  08/12/2012  T:  08/12/2012  Job:  [id number]

## 2012-08-14 ENCOUNTER — Ambulatory Visit: Payer: 59 | Admitting: Family Medicine

## 2012-08-15 ENCOUNTER — Encounter (HOSPITAL_COMMUNITY): Payer: Self-pay | Admitting: Obstetrics and Gynecology

## 2012-09-17 ENCOUNTER — Encounter: Payer: Self-pay | Admitting: Family Medicine

## 2012-09-17 ENCOUNTER — Ambulatory Visit (INDEPENDENT_AMBULATORY_CARE_PROVIDER_SITE_OTHER): Payer: 59 | Admitting: Family Medicine

## 2012-09-17 VITALS — BP 153/106 | HR 90 | Ht 66.5 in | Wt 227.0 lb

## 2012-09-17 DIAGNOSIS — F411 Generalized anxiety disorder: Secondary | ICD-10-CM

## 2012-09-17 DIAGNOSIS — I10 Essential (primary) hypertension: Secondary | ICD-10-CM

## 2012-09-17 DIAGNOSIS — K219 Gastro-esophageal reflux disease without esophagitis: Secondary | ICD-10-CM | POA: Insufficient documentation

## 2012-09-17 DIAGNOSIS — R110 Nausea: Secondary | ICD-10-CM

## 2012-09-17 DIAGNOSIS — K449 Diaphragmatic hernia without obstruction or gangrene: Secondary | ICD-10-CM

## 2012-09-17 MED ORDER — DEXLANSOPRAZOLE 60 MG PO CPDR: 60.0000 mg | DELAYED_RELEASE_CAPSULE | Freq: Every day | ORAL | Status: DC

## 2012-09-17 NOTE — Progress Notes (Signed)
CC: Denise Craig is a 42 y.o. female is here for Hypertension f/u and anxiety f/u   Subjective: HPI:  Patient presents for followup of hypertension and anxiety  Essential hypertension: At her last visit we stopped bystolic. She's had multiple visits to outside providers and reports systolics well below [id number], diastolics well below 90. She's unsure as to why her blood pressure is elevated today. No recent dietary changes nor increased salt intake. No decongestants. She denies motor or sensory disturbances, chest pain, shortness of breath, orthopnea, peripheral edema, nor irregular heartbeat  Anxiety: Started BuSpar at last visit. Reports noticeable decrease in stress at work although it is still present. Anxiety is improved and she treats this to starting BuSpar which is helped her deal with stress at work. Relationship with husband is unaffected, good relationship. She denies sadness, mental disturbance, mood swings. She's happy with the response, anxiety no longer interfering with quality of life  Complains of increased nausea since surgery team switched to AcipHex to omeprazole. She's taking omeprazole 20 mg twice a day. When stomach is empty she is noticed burning in the back of her throat radiates down to her stomach. Moderate with empty stomach, mild with eating solids but not present with liquids. Notices nausea has directly coincided with degree of the above burning sensation. Symptoms can occur any time of day depending on dietary habits. Denies waking from sleep, dysphagia, regurgitation nor vomiting. Has had somewhat loose stools since weight loss surgery but this has not deteriorated, past medical history significant for hiatal hernia. Denies any abdominal pain whatsoever, pain, right or quadrant pain, dysuria, genitourinary complaints, nor confusion     Review Of Systems Outlined In HPI  Past Medical History  Diagnosis Date  . Infertility, female   . Uterine polyp   . Iron  deficiency   . B12 deficiency   . 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 Anxiety   . Migraine     sleep problems- sees Dr Berdine Addison, EEG normal 12-07  . GERD (gastroesophageal reflux disease)     w/ moderate hiatal hernia (EGD 3-09), Dr August Saucer prn, prilosec  . Asthma     has not used inhaler in past 10 months-will bring dos  . Depression   . PONV (postoperative nausea and vomiting)      Family History  Problem Relation Age of Onset  . Hypertension Mother   . Diabetes Mother   . Cancer Father     metastatic, colon ca  . Breast cancer Mother      History  Substance Use Topics  . Smoking status: Never Smoker   . Smokeless tobacco: Not on file  . Alcohol Use: No     Objective: Filed Vitals:   09/17/12 1051  BP: 153/106  Pulse: 90    General: Alert and Oriented, No Acute Distress HEENT: Pupils equal, round, reactive to light. Conjunctivae clear.  External ears unremarkable, canals clear with intact TMs with appropriate landmarks.  Middle ear appears open without effusion. Pink inferior turbinates.  Moist mucous membranes, pharynx without inflammation nor lesions.  Neck supple without palpable lymphadenopathy nor abnormal masses. Lungs: Clear to auscultation bilaterally, no wheezing/ronchi/rales.  Comfortable work of breathing. Good air movement. Cardiac: Regular rate and rhythm. Normal S1/S2.  No murmurs, rubs, nor gallops.   Abdomen: Obese soft nontender. Extremities: No peripheral edema.  Strong peripheral pulses.  Mental Status: No depression, anxiety, nor agitation. Skin: Warm and dry.  Assessment & Plan: Denise Craig was seen  today for hypertension f/u and anxiety f/u.  Diagnoses and associated orders for this visit:  Hiatal hernia  Nausea  Gerd (gastroesophageal reflux disease) - dexlansoprazole (DEXILANT) 60 MG capsule; Take 1 capsule (60 mg total) by mouth daily.  Anxiety  Essential hypertension, benign    Essential hypertension: Uncontrolled  manual checked by myself confirms automatic value. Patient reluctant to restarting antihypertensive regimen, will return on Friday or Monday for recheck and if I agrees to restartbystolic  Anxiety: Controlled and improved, continue BuSpar Nausea: Suspect GERD in the setting of a hiatal hernia is influencing this. Trial of dexilant samples, it improvement we'll see if prescription is financially reasonable. Otherwise we'll consider restarting AcipHex  Return in about 2 days (around 09/19/2012).

## 2012-09-22 ENCOUNTER — Ambulatory Visit (INDEPENDENT_AMBULATORY_CARE_PROVIDER_SITE_OTHER): Payer: 59 | Admitting: Family Medicine

## 2012-09-22 VITALS — BP 150/97

## 2012-09-22 DIAGNOSIS — I10 Essential (primary) hypertension: Secondary | ICD-10-CM

## 2012-09-22 MED ORDER — NEBIVOLOL HCL 5 MG PO TABS: 5.0000 mg | ORAL_TABLET | Freq: Every day | ORAL | Status: DC

## 2012-09-22 NOTE — Progress Notes (Signed)
  Subjective:    Patient ID: Denise Craig, female    DOB: 04-08-1971, 42 y.o.   MRN: [id number] Nurse blood pressure check. No complaints of SOB, H/A, chest pain or dizziness. Pt taking meds as directed. 5 minutes spent with this patient Pt states her bp is always normal when not in this office.  Pt has not taken bp & kept log of bp readings so advised pt to check bp every day this week & to keep a log of it & call us on Friday with these readings & would send to you for a decision on whether to start bp meds or not. KG HPI    Review of Systems     Objective:   Physical Exam        Assessment & Plan:

## 2012-09-22 NOTE — Progress Notes (Signed)
Seth Bake, Will you please let Mrs. Oyola know that I'd encourage her to restart Bystolic, I'll call in a 5mg  daily dose wish is a relatively low dose.  I'd like her to return and see me one week after starting it.  Thank you.

## 2012-09-23 ENCOUNTER — Telehealth: Payer: Self-pay | Admitting: Family Medicine

## 2012-09-23 NOTE — Telephone Encounter (Signed)
Seth Bake, I'm not sure if I correctly set up Shelia's encounter note to be forwarded to you yesterday.  It is in regards to uncontrolled HTN and my recommendation to restart bystolic.  Let me know if you have trouble accessing it.Thanks -Hurley Sobel

## 2012-09-23 NOTE — Telephone Encounter (Signed)
Yes I did receive the encounter. I spoke with the pt and she would rather take her BP at home for a week and then let us know what they are before she starts BP meds

## 2012-09-24 ENCOUNTER — Encounter: Payer: Self-pay | Admitting: Gastroenterology

## 2012-10-03 ENCOUNTER — Other Ambulatory Visit: Payer: Self-pay | Admitting: *Deleted

## 2012-10-03 DIAGNOSIS — K219 Gastro-esophageal reflux disease without esophagitis: Secondary | ICD-10-CM

## 2012-10-03 MED ORDER — DEXLANSOPRAZOLE 60 MG PO CPDR: 60.0000 mg | DELAYED_RELEASE_CAPSULE | Freq: Every day | ORAL | Status: DC

## 2012-10-03 NOTE — Telephone Encounter (Signed)
Left message on vm

## 2012-10-03 NOTE — Telephone Encounter (Signed)
Pt calls and sttaes that the samples of acid reflux med you gave her worked well and needs rx sent to [facility]. Dexilant 60mg 

## 2012-10-03 NOTE — Telephone Encounter (Signed)
Seth Bake, Will you please let Denise Craig know that I sent this in for her and glad to hear she's feeling better.

## 2012-10-24 ENCOUNTER — Other Ambulatory Visit: Payer: Self-pay | Admitting: Family Medicine

## 2012-10-24 NOTE — Telephone Encounter (Signed)
Denise Craig, Will you please let Denise Craig know i sent this in to [facility].

## 2012-10-24 NOTE — Telephone Encounter (Signed)
Pt.notified

## 2012-11-05 ENCOUNTER — Ambulatory Visit: Payer: 59 | Admitting: Family Medicine

## 2013-03-26 ENCOUNTER — Telehealth: Payer: Self-pay | Admitting: Family Medicine

## 2013-03-26 ENCOUNTER — Other Ambulatory Visit: Payer: Self-pay | Admitting: Family Medicine

## 2013-03-26 NOTE — Telephone Encounter (Signed)
Reviewed appendicitis visit at [facility].

## 2013-04-29 LAB — BASIC METABOLIC PANEL
Glucose: 96 mg/dL
Potassium: 4.1 mmol/L (ref 3.4–5.3)
Sodium: 143 mmol/L (ref 137–147)

## 2013-04-29 LAB — VITAMIN B12: Folate: 13.8

## 2013-04-29 LAB — LIPID PANEL
Cholesterol: 165 mg/dL (ref 0–200)
HDL: 59 mg/dL (ref 35–70)
LDL Cholesterol: 95 mg/dL

## 2013-04-29 LAB — CBC AND DIFFERENTIAL: Hemoglobin: 12.5 g/dL (ref 12.0–16.0)

## 2013-05-14 ENCOUNTER — Encounter: Payer: Self-pay | Admitting: Family Medicine

## 2013-05-19 ENCOUNTER — Encounter: Payer: Self-pay | Admitting: *Deleted

## 2013-06-30 ENCOUNTER — Other Ambulatory Visit: Payer: Self-pay | Admitting: Family Medicine

## 2013-07-07 ENCOUNTER — Encounter: Payer: Self-pay | Admitting: Internal Medicine

## 2013-07-07 ENCOUNTER — Encounter: Payer: Self-pay | Admitting: Gastroenterology

## 2013-07-14 ENCOUNTER — Ambulatory Visit (INDEPENDENT_AMBULATORY_CARE_PROVIDER_SITE_OTHER): Payer: 59 | Admitting: Family Medicine

## 2013-07-14 ENCOUNTER — Encounter: Payer: Self-pay | Admitting: Family Medicine

## 2013-07-14 VITALS — BP 120/78 | HR 61 | Wt 165.0 lb

## 2013-07-14 DIAGNOSIS — N200 Calculus of kidney: Secondary | ICD-10-CM

## 2013-07-14 DIAGNOSIS — F411 Generalized anxiety disorder: Secondary | ICD-10-CM

## 2013-07-14 MED ORDER — VENLAFAXINE HCL ER 75 MG PO CP24: ORAL_CAPSULE | ORAL | Status: DC

## 2013-07-14 NOTE — Progress Notes (Signed)
CC: Denise Craig is a 42 y.o. female is here for Anxiety   Subjective: HPI:  Followup anxiety: Patient complains that she is noticing that she's becoming more irritable at work also having difficulty sleeping for no particular reason. Symptoms are moderate in severity on a daily basis improved the more time she stays away from work. No difficulty with relationships at home. Continues to take BuSpar twice a day without known side effects. Denies depression, mental disturbance, paranoia, unintentional weight loss, racing heart, shortness of breath or chest pain  Patient reports that about 2 weeks ago she had right flank pain that lasted for a few minutes came on out of nowhere left without intervention. Later that day she noticed blood in her urine however neither of the above issues have persisted beyond that one day. She does have a history of nephrolithiasis, there was a a nonobstructive stone seen on a CT scan in July. Currently she denies flank pain, urinary complaints, dysuria, urinary frequency, urinary hesitancy, nor pelvic pain   Review Of Systems Outlined In HPI  Past Medical History  Diagnosis Date  . Infertility, female   . Uterine polyp   . Iron deficiency   . B12 deficiency   . 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 Anxiety   . Migraine     sleep problems- sees Dr Berdine Addison, EEG normal 12-07  . GERD (gastroesophageal reflux disease)     w/ moderate hiatal hernia (EGD 3-09), Dr August Saucer prn, prilosec  . Asthma     has not used inhaler in past 10 months-will bring dos  . Depression   . PONV (postoperative nausea and vomiting)   . Nephrolithiasis      Family History  Problem Relation Age of Onset  . Hypertension Mother   . Diabetes Mother   . Cancer Father     metastatic, colon ca  . Breast cancer Mother      History  Substance Use Topics  . Smoking status: Never Smoker   . Smokeless tobacco: Not on file  . Alcohol Use: No     Objective: Filed  Vitals:   07/14/13 1003  BP: 120/78  Pulse: 61    General: Alert and Oriented, No Acute Distress HEENT: Pupils equal, round, reactive to light. Conjunctivae clear.  Moist mucous membranes pharynx unremarkable Lungs: Clear to auscultation bilaterally, no wheezing/ronchi/rales.  Comfortable work of breathing. Good air movement. Cardiac: Regular rate and rhythm. Normal S1/S2.  No murmurs, rubs, nor gallops.   Back: No CVA tenderness Extremities: No peripheral edema.  Strong peripheral pulses.  Mental Status: No depression, anxiety, nor agitation. Skin: Warm and dry.  Assessment & Plan: Denise Craig was seen today for anxiety.  Diagnoses and associated orders for this visit:  ANXIETY - venlafaxine XR (EFFEXOR-XR) 75 MG 24 hr capsule; One by mouth every day for five days then two by mouth daily  Nephrolithiasis    Anxiety: Uncontrolled stop BuSpar titrate up on Effexor, return in one month if symptoms are not controlled otherwise will call in 150 mg extended release for followup in February. Nephrolithiasis: Currently asymptomatic and controlled discussed staying well-hydrated, return for signs and symptoms of nephrolithiasis we could order a CT scan here in our office.  Return in about 4 weeks (around 08/11/2013).

## 2013-08-14 ENCOUNTER — Telehealth: Payer: Self-pay | Admitting: Family Medicine

## 2013-08-14 DIAGNOSIS — F411 Generalized anxiety disorder: Secondary | ICD-10-CM

## 2013-08-14 MED ORDER — VENLAFAXINE HCL ER 150 MG PO CP24: ORAL_CAPSULE | ORAL | Status: DC

## 2013-08-14 NOTE — Telephone Encounter (Signed)
[facility] req

## 2013-09-18 ENCOUNTER — Telehealth: Payer: Self-pay | Admitting: *Deleted

## 2013-09-18 NOTE — Telephone Encounter (Signed)
Spoke with pt and recommended that she go to [facility] or ED for [facility]. States if pain gets worse she will otherwise states she can come in Tuesday am. Pt scheduled for Tuesday am at 8:15.  Oscar La, LPN

## 2013-09-18 NOTE — Telephone Encounter (Signed)
Pt went to a [facility] and was told she has pancreatitis with severe pain. States they informed her to f/u with PCP in 1 month. She is concerned b/c she is having pain when she eats. I informed her to start a clear liquid diet and that she should possibly come in Monday or Tuesday. She would like to know your opinion. Please let me know so that I can call her back today before I leave.  Oscar La, LPN

## 2013-09-22 ENCOUNTER — Ambulatory Visit (INDEPENDENT_AMBULATORY_CARE_PROVIDER_SITE_OTHER): Payer: 59 | Admitting: Family Medicine

## 2013-09-22 ENCOUNTER — Encounter: Payer: Self-pay | Admitting: Family Medicine

## 2013-09-22 VITALS — BP 109/74 | HR 88 | Wt 152.0 lb

## 2013-09-22 DIAGNOSIS — R1011 Right upper quadrant pain: Secondary | ICD-10-CM

## 2013-09-22 NOTE — Progress Notes (Signed)
CC: Denise Craig is a 43 y.o. female is here for Abdominal Pain   Subjective: HPI:  Patient complains of right upper quadrant pain that came on late last week localized in the right upper quadrant slightly radiating into the epigastric space and also directly into her back. It came on minutes after she finished eating described as severe and stabbing she described it felt identical to her appendicitis that happened last summer. Was accompanied by nausea and went away on its own after about an hour. Soon after symptoms came on she was seen at a local urgent care had an unremarkable abdominal x-ray, CBC, urinalysis, and complete metabolic panel only significant for slightly elevated AST. Her lipase was completely normal. She tells me that her pain is improved with pressing on the abdomen near the site of pain. Nothing else makes better or worse. She states she has these episodes approximately 3-4 times a month for the past 3-6 months. She denies recent or remote fevers, chills, diarrhea, constipation, vomiting, jaundice nor genitourinary complaints. Today she tells me her pain is completely gone it has been since Saturday  Review Of Systems Outlined In HPI  Past Medical History  Diagnosis Date  . Infertility, female   . Uterine polyp   . Iron deficiency   . B12 deficiency   . 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 Anxiety   . Migraine     sleep problems- sees Dr Berdine Addison, EEG normal 12-07  . GERD (gastroesophageal reflux disease)     w/ moderate hiatal hernia (EGD 3-09), Dr August Saucer prn, prilosec  . Asthma     has not used inhaler in past 10 months-will bring dos  . Depression   . PONV (postoperative nausea and vomiting)   . Nephrolithiasis      Family History  Problem Relation Age of Onset  . Hypertension Mother   . Diabetes Mother   . Cancer Father     metastatic, colon ca  . Breast cancer Mother      History  Substance Use Topics  . Smoking status: Never Smoker    . Smokeless tobacco: Not on file  . Alcohol Use: No     Objective: Filed Vitals:   09/22/13 0819  BP: 109/74  Pulse: 88    General: Alert and Oriented, No Acute Distress HEENT: Pupils equal, round, reactive to light. Conjunctivae clear. Moist mucous membranes pharynx unremarkable Lungs: Clear to auscultation bilaterally, no wheezing/ronchi/rales.  Comfortable work of breathing. Good air movement. Cardiac: Regular rate and rhythm. Abdomen: Normal bowel sounds, soft and non tender without palpable masses. Negative Murphy's Extremities: No peripheral edema.  Strong peripheral pulses.  Mental Status: No depression, anxiety, nor agitation. Skin: Warm and dry.  Assessment & Plan: Dila was seen today for abdominal pain.  Diagnoses and associated orders for this visit:  RUQ pain - US Abdomen Complete; Future    Encouraged to have a abdominal ultrasound to rule out cholelithiasis low suspicion for acute cholecystitis given her lack of pain right now however symptoms suspicious for this late last week and prior episodes.  She is interested in removal of her gallbladder if she is a candidate and stones are present.  Return if symptoms worsen or fail to improve.

## 2013-09-24 ENCOUNTER — Other Ambulatory Visit (HOSPITAL_COMMUNITY): Payer: Self-pay | Admitting: *Deleted

## 2013-09-24 ENCOUNTER — Ambulatory Visit (HOSPITAL_COMMUNITY)
Admission: RE | Admit: 2013-09-24 | Discharge: 2013-09-24 | Disposition: A | Discharge status: Home / Self Care | Payer: 59 | Source: Ambulatory Visit | Attending: Family Medicine | Admitting: Family Medicine

## 2013-09-24 ENCOUNTER — Telehealth: Payer: Self-pay | Admitting: *Deleted

## 2013-09-24 ENCOUNTER — Ambulatory Visit (HOSPITAL_COMMUNITY)
Admission: RE | Admit: 2013-09-24 | Discharge: 2013-09-24 | Disposition: A | Discharge status: Home / Self Care | Payer: 59 | Source: Ambulatory Visit | Attending: *Deleted | Admitting: *Deleted

## 2013-09-24 ENCOUNTER — Telehealth: Payer: Self-pay | Admitting: Family Medicine

## 2013-09-24 DIAGNOSIS — Z9884 Bariatric surgery status: Secondary | ICD-10-CM | POA: Insufficient documentation

## 2013-09-24 DIAGNOSIS — K219 Gastro-esophageal reflux disease without esophagitis: Secondary | ICD-10-CM | POA: Insufficient documentation

## 2013-09-24 DIAGNOSIS — R1033 Periumbilical pain: Secondary | ICD-10-CM | POA: Insufficient documentation

## 2013-09-24 DIAGNOSIS — N200 Calculus of kidney: Secondary | ICD-10-CM | POA: Insufficient documentation

## 2013-09-24 DIAGNOSIS — R109 Unspecified abdominal pain: Secondary | ICD-10-CM

## 2013-09-24 DIAGNOSIS — R1011 Right upper quadrant pain: Secondary | ICD-10-CM | POA: Insufficient documentation

## 2013-09-24 DIAGNOSIS — K824 Cholesterolosis of gallbladder: Secondary | ICD-10-CM | POA: Insufficient documentation

## 2013-09-24 DIAGNOSIS — Z9089 Acquired absence of other organs: Secondary | ICD-10-CM | POA: Insufficient documentation

## 2013-09-24 MED ORDER — DICYCLOMINE HCL 20 MG PO TABS: ORAL_TABLET | ORAL | Status: DC

## 2013-09-24 NOTE — Telephone Encounter (Signed)
[facility]

## 2013-09-24 NOTE — Telephone Encounter (Signed)
Denise Craig, Will you please let Denise Craig know that her ultrasound showed a 64mm polyp in the gallbladder but not in a location to cause obstruction, no other abnormalites to account for her pain.  My only thought is that her pain is due to intestinal spasms, this could be relieved with dicyclomine which I've printed off if she's interested in taking it only on an as needed basis.

## 2013-09-24 NOTE — Telephone Encounter (Signed)
Pt notified and rx faxed

## 2013-10-01 ENCOUNTER — Other Ambulatory Visit: Payer: 59

## 2014-07-22 ENCOUNTER — Other Ambulatory Visit: Payer: Self-pay | Admitting: Obstetrics and Gynecology

## 2014-07-26 LAB — CYTOLOGY - PAP

## 2015-03-08 ENCOUNTER — Encounter: Payer: Self-pay | Admitting: Gastroenterology

## 2015-04-05 ENCOUNTER — Ambulatory Visit (INDEPENDENT_AMBULATORY_CARE_PROVIDER_SITE_OTHER): Payer: 59 | Admitting: Family Medicine

## 2015-04-05 ENCOUNTER — Encounter: Payer: Self-pay | Admitting: Family Medicine

## 2015-04-05 VITALS — BP 108/64 | HR 75 | Wt 157.0 lb

## 2015-04-05 DIAGNOSIS — Z23 Encounter for immunization: Secondary | ICD-10-CM | POA: Diagnosis not present

## 2015-04-05 DIAGNOSIS — R42 Dizziness and giddiness: Secondary | ICD-10-CM

## 2015-04-05 DIAGNOSIS — Z7184 Encounter for health counseling related to travel: Secondary | ICD-10-CM

## 2015-04-05 DIAGNOSIS — Z79899 Other long term (current) drug therapy: Secondary | ICD-10-CM

## 2015-04-05 MED ORDER — TYPHOID VACCINE PO CPDR: 1.0000 | DELAYED_RELEASE_CAPSULE | ORAL | Status: DC

## 2015-04-05 MED ORDER — ATOVAQUONE-PROGUANIL HCL 250-100 MG PO TABS: 1.0000 | ORAL_TABLET | Freq: Every day | ORAL | Status: DC

## 2015-04-05 NOTE — Patient Instructions (Signed)
Lab only visit after seven days of atovaquone-proguanil to check your liver function, if this is normal I'll give you a regimen to take for the trip to [place].  You'll need your second and final hepatits A shot some time between February-July 2017

## 2015-04-05 NOTE — Progress Notes (Signed)
CC: Denise Craig is a 44 y.o. female is here for Hep A and typhoid   Subjective: HPI:  Will be traveling to [place] in early September. She'll be in areas where malaria is known to exist. She would like to know what vaccination she needs to get. She's had hepatitis B vaccination and is up-to-date on tetanus booster. She was thinking about Malarone however she has a history of elevated liver function tests and she is worried that this may cause some liver issues. Liver function was checked within the last few months and was normal.  Her only complaint today is lightheadedness whenever going from a leaning over to standing position. The last matter of seconds. Mild in severity. Present for the last 2 days. Denies current symptoms any other situation. Denies irregular heartbeat, chest pain, nor any other motor or sensory disturbance   Review Of Systems Outlined In HPI  Past Medical History  Diagnosis Date  . Infertility, female   . Uterine polyp   . Iron deficiency   . B12 deficiency   . 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 Anxiety   . Migraine     sleep problems- sees Dr Berdine Addison, EEG normal 12-07  . GERD (gastroesophageal reflux disease)     w/ moderate hiatal hernia (EGD 3-09), Dr August Saucer prn, prilosec  . Asthma     has not used inhaler in past 10 months-will bring dos  . Depression   . PONV (postoperative nausea and vomiting)   . Nephrolithiasis     Past Surgical History  Procedure Laterality Date  . Tonsillectomy    . Endometrial ablation spring  2008  . Right knee lateral release surgery  2011  . Gastric bypass  2013  . Tumor removed  2012    removed right ankle  . Laparoscopic tubal ligation  08/12/2012    Procedure: LAPAROSCOPIC TUBAL LIGATION;  Surgeon: Marylynn Pearson, MD;  Location: [facility];  Service: Gynecology;  Laterality: Bilateral;  WITH FILSHIE CLIPS  . Hysteroscopy with novasure  08/12/2012    Procedure: HYSTEROSCOPY WITH NOVASURE;  Surgeon:  Marylynn Pearson, MD;  Location: [facility];  Service: Gynecology;  Laterality: N/A;  . Appendectomy     Family History  Problem Relation Age of Onset  . Hypertension Mother   . Diabetes Mother   . Cancer Father     metastatic, colon ca  . Breast cancer Mother     History   Social History  . Marital Status: Married    Spouse Name: N/A  . Number of Children: N/A  . Years of Education: N/A   Occupational History  . Not on file.   Social History Main Topics  . Smoking status: Never Smoker   . Smokeless tobacco: Not on file  . Alcohol Use: No  . Drug Use: No  . Sexual Activity: Not on file   Other Topics Concern  . Not on file   Social History Narrative     Objective: BP 108/64 mmHg  Pulse 75  Wt 157 lb (71.215 kg)  General: Alert and Oriented, No Acute Distress HEENT: Pupils equal, round, reactive to light. Conjunctivae clear.  Moist mucous membranes pharynx unremarkable Lungs: Clear to auscultation bilaterally, no wheezing/ronchi/rales.  Comfortable work of breathing. Good air movement. Cardiac: Regular rate and rhythm. Normal S1/S2.  No murmurs, rubs, nor gallops.  No carotid bruit Extremities: No peripheral edema.  Strong peripheral pulses.  Mental Status: No depression, anxiety, nor agitation. Skin:  Warm and dry.  Assessment & Plan: [name] was seen today for hep a and typhoid.  Diagnoses and all orders for this visit:  Travel advice encounter Orders: -     atovaquone-proguanil (MALARONE) 250-100 MG TABS; Take 1 tablet by mouth daily. Get blood work after Rx runs out. -     Hepatic function panel -     typhoid (VIVOTIF) DR capsule; Take 1 capsule by mouth every other day. For only four doses.  Ensure you finish this 1 week before departure. -     Hepatitis A vaccine adult IM  High risk medication use Orders: -     Hepatic function panel  Lightheaded   Travel advice: will receive first of 2 hepatitis A vaccines, encouraged to come back in 6 months to 1  year to have the second and final vaccination. Perception for oral typhoid vaccine has been sent to her pharmacy. To address her concerns about Malarone causing liver issues have given her a one-week supply of this medication and have her return for lab only visit to check her liver function. If function is normal she had both feel comfortable that she could take this for the 3 weeks needed closer to September. Lightheadedness: Increase salt and fluids in the diet.Signs and symptoms requring emergent/urgent reevaluation were discussed with the patient.  Return in about 6 months (around 10/06/2015).

## 2015-04-06 ENCOUNTER — Telehealth: Payer: Self-pay | Admitting: *Deleted

## 2015-04-06 DIAGNOSIS — Z7184 Encounter for health counseling related to travel: Secondary | ICD-10-CM

## 2015-04-06 MED ORDER — ATOVAQUONE-PROGUANIL HCL 250-100 MG PO TABS: ORAL_TABLET | ORAL | Status: DC

## 2015-04-06 NOTE — Telephone Encounter (Signed)
Pt left vm this morning wanting you to know that she will not be able to do the trial run of the malaria pills as her ins will only cover it one time. She wanted to know if you could just go ahead and send the full rx for it instead.  Please advise.

## 2015-04-06 NOTE — Telephone Encounter (Signed)
New Rx sent to [facility] [address]

## 2015-04-06 NOTE — Telephone Encounter (Signed)
Pt notified of rx. 

## 2015-08-23 ENCOUNTER — Ambulatory Visit (INDEPENDENT_AMBULATORY_CARE_PROVIDER_SITE_OTHER): Payer: 59 | Admitting: Family Medicine

## 2015-08-23 ENCOUNTER — Encounter: Payer: Self-pay | Admitting: Family Medicine

## 2015-08-23 VITALS — BP 118/76 | HR 73 | Wt 162.0 lb

## 2015-08-23 DIAGNOSIS — B9689 Other specified bacterial agents as the cause of diseases classified elsewhere: Secondary | ICD-10-CM

## 2015-08-23 DIAGNOSIS — A499 Bacterial infection, unspecified: Secondary | ICD-10-CM

## 2015-08-23 DIAGNOSIS — J329 Chronic sinusitis, unspecified: Secondary | ICD-10-CM | POA: Diagnosis not present

## 2015-08-23 DIAGNOSIS — F411 Generalized anxiety disorder: Secondary | ICD-10-CM

## 2015-08-23 DIAGNOSIS — E785 Hyperlipidemia, unspecified: Secondary | ICD-10-CM

## 2015-08-23 MED ORDER — ESCITALOPRAM OXALATE 10 MG PO TABS: 10.0000 mg | ORAL_TABLET | Freq: Every day | ORAL | Status: DC

## 2015-08-23 MED ORDER — AMOXICILLIN-POT CLAVULANATE 500-125 MG PO TABS: ORAL_TABLET | ORAL | Status: AC

## 2015-08-23 NOTE — Progress Notes (Signed)
CC: Denise Craig is a 44 y.o. female is here for Sinusitis and Medication Management   Subjective: HPI:  Complains of irritability that's been present for a matter of years but more problematic over the past month. It's present on a daily basis. It's present mostly at work. She reports that it's interfering with her quality of life and causing her to lose sleep. She was prescribed Xanax however caused her to fall asleep and she cannot take this medication at work. She is taking Zoloft but does not seem to help. She was also on Effexor once but was not helpful. She denies any depression but does occasionally feel anxious with her irritability. No thoughts Of wanting to harm self or others  For the past 4 days she's felt facial pressure in the forehead, nasal congestion and sore throat. It has improved today after taking pseudoephedrine. It still mild in severity. Nothing else makes better or worse. She denies fevers, chills, cough or wheezing.   Review Of Systems Outlined In HPI  Past Medical History  Diagnosis Date  . Infertility, female   . Uterine polyp   . Iron deficiency   . B12 deficiency   . 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 Anxiety   . Migraine     sleep problems- sees Denise Denise Craig, EEG normal 12-07  . GERD (gastroesophageal reflux disease)     w/ moderate hiatal hernia (EGD 3-09), Denise Craig prn, prilosec  . Asthma     has not used inhaler in past 10 months-will bring dos  . Depression   . PONV (postoperative nausea and vomiting)   . Nephrolithiasis     Past Surgical History  Procedure Laterality Date  . Tonsillectomy    . Endometrial ablation spring  2008  . Right knee lateral release surgery  2011  . Gastric bypass  2013  . Tumor removed  2012    removed right ankle  . Laparoscopic tubal ligation  08/12/2012    Procedure: LAPAROSCOPIC TUBAL LIGATION;  Surgeon: Denise Pearson, MD;  Location: [facility];  Service: Gynecology;  Laterality: Bilateral;  WITH  FILSHIE CLIPS  . Hysteroscopy with novasure  08/12/2012    Procedure: HYSTEROSCOPY WITH NOVASURE;  Surgeon: Denise Pearson, MD;  Location: [facility];  Service: Gynecology;  Laterality: N/A;  . Appendectomy     Family History  Problem Relation Age of Onset  . Hypertension Mother   . Diabetes Mother   . Cancer Father     metastatic, colon ca  . Breast cancer Mother     Social History   Social History  . Marital Status: Married    Spouse Name: N/A  . Number of Children: N/A  . Years of Education: N/A   Occupational History  . Not on file.   Social History Main Topics  . Smoking status: Never Smoker   . Smokeless tobacco: Not on file  . Alcohol Use: No  . Drug Use: No  . Sexual Activity: Not on file   Other Topics Concern  . Not on file   Social History Narrative     Objective: BP 118/76 mmHg  Pulse 73  Wt 162 lb (73.483 kg)  General: Alert and Oriented, No Acute Distress HEENT: Pupils equal, round, reactive to light. Conjunctivae clear.  External ears unremarkable, canals clear with intact TMs with appropriate landmarks.  Middle ear appears open without effusion. Pink inferior turbinates.  Moist mucous membranes, pharynx without inflammation nor lesions.  Neck supple  without palpable lymphadenopathy nor abnormal masses. Lungs: clear and comfortable work of breathing Cardiac: Regular rate and rhythm. Abdomen: Normal bowel sounds, soft and non tender without palpable masses. Extremities: No peripheral edema.  Strong peripheral pulses.  Mental Status: No depression, anxiety, nor agitation. Skin: Warm and dry.  Assessment & Plan: Suah was seen today for sinusitis and medication management.  Diagnoses and all orders for this visit:  Anxiety state -     Discontinue: escitalopram (LEXAPRO) 10 MG tablet; Take 1 tablet (10 mg total) by mouth daily. -     escitalopram (LEXAPRO) 10 MG tablet; Take 1 tablet (10 mg total) by mouth daily.  Bacterial sinusitis -      amoxicillin-clavulanate (AUGMENTIN) 500-125 MG tablet; Take one by mouth every 8 hours for ten total days.  Dyslipidemia -     Lipid panel   Anxiety and irritability: Uncontrolled chronic condition, tapering down on Zoloft then begin daily Lexapro. Sinusitis: If symptoms persist until this Friday start  Augmentin, may continue pseudoephedrine for now She has a history of dyslipidemia and is requesting lab slips for a lipid panel which seems reasonable.  Return in about 3 months (around 11/21/2015).

## 2015-09-01 LAB — LIPID PANEL
CHOL/HDL RATIO: 2 ratio (ref <=5.0)
CHOLESTEROL: 156 mg/dL (ref 125–200)
HDL: 78 mg/dL (ref >=46)
LDL Cholesterol: 65 mg/dL (ref <130)
Triglycerides: 65 mg/dL (ref <150)
VLDL: 13 mg/dL (ref <30)

## 2015-10-05 ENCOUNTER — Telehealth: Payer: Self-pay

## 2015-10-05 DIAGNOSIS — F411 Generalized anxiety disorder: Secondary | ICD-10-CM

## 2015-10-05 MED ORDER — ESCITALOPRAM OXALATE 20 MG PO TABS: 20.0000 mg | ORAL_TABLET | Freq: Every day | ORAL | Status: DC

## 2015-10-05 NOTE — Telephone Encounter (Signed)
Detailed vm left for pt explaining the next steps for medication changes

## 2015-10-05 NOTE — Telephone Encounter (Signed)
Pt stated that she's been on lexapro for a little over a month now and has not seen any improvements.  She wants to know will you change the dose or change the medication?

## 2015-10-05 NOTE — Telephone Encounter (Signed)
I would like try the max dose of 20mg  daily before giving up on this medication. I'll send an updated Rx to her mail order pharmacy.  Follow up two weeks if still not helping.

## 2015-12-19 ENCOUNTER — Emergency Department
Admission: EM | Admit: 2015-12-19 | Discharge: 2015-12-19 | Disposition: A | Discharge status: Home / Self Care | Payer: 59 | Source: Home / Self Care | Attending: Family Medicine | Admitting: Family Medicine

## 2015-12-19 DIAGNOSIS — R42 Dizziness and giddiness: Secondary | ICD-10-CM

## 2015-12-19 DIAGNOSIS — H9201 Otalgia, right ear: Secondary | ICD-10-CM | POA: Diagnosis not present

## 2015-12-19 MED ORDER — MECLIZINE HCL 25 MG PO TABS: 25.0000 mg | ORAL_TABLET | Freq: Three times a day (TID) | ORAL | Status: DC | PRN

## 2015-12-19 NOTE — ED Notes (Signed)
Orthostatic BP Lying down 127/85 60; standing 1 min 119/81,75; standing 3 mins 120/83 80

## 2015-12-19 NOTE — ED Provider Notes (Signed)
CSN: [id number]     Arrival date & time 12/19/15  1901 History   First MD Initiated Contact with Patient 12/19/15 1905     Chief Complaint  Patient presents with  . Otalgia   (Consider location/radiation/quality/duration/timing/severity/associated sxs/prior Treatment) HPI The pt is a 45yo female presenting to [facility] with c/o ear pain and dizziness described as feeling off balance.  Pt states she developed bilateral ear pain, but pointing to Right ear stating it has been aching and sore for 3 days. Pain is minimal at this time. She started taking Sudafed at onset of ear pain and mild nasal congestion.  No help with ear pain but this afternoon she developed mild dizziness, which is worse when going from seated to standing position.  Symptoms resolve within a few seconds of standing. Denies chest pain, SOB, or palpitations.  She reports hx of low blood pressure as low as systolic being [id number] a few months ago.  Pt states she use to have high blood pressure but since losing a lot of weight, secondary to gastric sleeve, she developed low blood pressure.  Denies recent change of medications or doses. When asked to clarify medications, pt states "it's all on file." Medications viewed in [place] from [place].  Denies recent head injury.  Denies hx of vertigo but has been lightheaded in the past.  Denies numbness, tingling or weakness in arms or legs. Denies headache or change in vision. Denies fever, chills, n/v/d.   Past Medical History  Diagnosis Date  . Infertility, female   . Uterine polyp   . Iron deficiency   . B12 deficiency   . 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 Anxiety   . Migraine     sleep problems- sees Dr Berdine Addison, EEG normal 12-07  . GERD (gastroesophageal reflux disease)     w/ moderate hiatal hernia (EGD 3-09), Dr August Saucer prn, prilosec  . Asthma     has not used inhaler in past 10 months-will bring dos  . Depression   . PONV (postoperative nausea and vomiting)   .  Nephrolithiasis    Past Surgical History  Procedure Laterality Date  . Tonsillectomy    . Endometrial ablation spring  2008  . Right knee lateral release surgery  2011  . Gastric bypass  2013  . Tumor removed  2012    removed right ankle  . Laparoscopic tubal ligation  08/12/2012    Procedure: LAPAROSCOPIC TUBAL LIGATION;  Surgeon: Marylynn Pearson, MD;  Location: [facility];  Service: Gynecology;  Laterality: Bilateral;  WITH FILSHIE CLIPS  . Hysteroscopy with novasure  08/12/2012    Procedure: HYSTEROSCOPY WITH NOVASURE;  Surgeon: Marylynn Pearson, MD;  Location: [facility];  Service: Gynecology;  Laterality: N/A;  . Appendectomy     Family History  Problem Relation Age of Onset  . Hypertension Mother   . Diabetes Mother   . Cancer Father     metastatic, colon ca  . Breast cancer Mother    Social History  Substance Use Topics  . Smoking status: Never Smoker   . Smokeless tobacco: None  . Alcohol Use: No   OB History    No data available     Review of Systems  Constitutional: Negative for fever and chills.  HENT: Positive for congestion and ear pain. Negative for ear discharge, sore throat, trouble swallowing and voice change.   Eyes: Negative for pain and visual disturbance.  Respiratory: Negative for cough and shortness of breath.  Cardiovascular: Negative for chest pain and palpitations.  Gastrointestinal: Negative for nausea, vomiting, abdominal pain and diarrhea.  Musculoskeletal: Negative for myalgias, back pain and arthralgias.  Skin: Negative for rash.  Neurological: Positive for dizziness and light-headedness. Negative for syncope, speech difficulty, weakness and headaches.    Allergies  Morphine and related and Prednisone  Home Medications   Prior to Admission medications   Medication Sig Start Date End Date Taking? Authorizing Provider  escitalopram (LEXAPRO) 20 MG tablet Take 1 tablet (20 mg total) by mouth daily. 10/05/15   Marcial Pacas, DO  meclizine  (ANTIVERT) 25 MG tablet Take 1 tablet (25 mg total) by mouth 3 (three) times daily as needed for dizziness. 12/19/15   Noland Fordyce, PA-C  SUMAtriptan (IMITREX) 20 MG/ACT nasal spray Place 1 spray (20 mg total) into the nose as needed for migraine. Patient not taking: Reported on 04/05/2015 01/18/12 07/14/13  Hali Marry, MD   Meds Ordered and Administered this Visit  Medications - No data to display  BP 128/82 mmHg  Pulse 68  Temp(Src) 98.3 F (36.8 C) (Oral)  Ht 5' 6.5" (1.689 m)  Wt 176 lb 12 oz (80.173 kg)  BMI 28.10 kg/m2  SpO2 98% No data found.   Physical Exam  Constitutional: She is oriented to person, place, and time. She appears well-developed and well-nourished. No distress.  HENT:  Head: Normocephalic and atraumatic.  Right Ear: Tympanic membrane normal.  Left Ear: Tympanic membrane normal.  Nose: Mucosal edema present. Right sinus exhibits no maxillary sinus tenderness and no frontal sinus tenderness. Left sinus exhibits no maxillary sinus tenderness and no frontal sinus tenderness.  Mouth/Throat: Uvula is midline, oropharynx is clear and moist and mucous membranes are normal.  Eyes: Conjunctivae and EOM are normal. Pupils are equal, round, and reactive to light. No scleral icterus.  Neck: Normal range of motion. Neck supple.  Cardiovascular: Normal rate, regular rhythm and normal heart sounds.   Pulmonary/Chest: Effort normal and breath sounds normal. No respiratory distress. She has no wheezes. She has no rales. She exhibits no tenderness.  Abdominal: Soft. She exhibits no distension. There is no tenderness.  Musculoskeletal: Normal range of motion.  5/5 strength in upper and lower extremities bilaterally.   Neurological: She is alert and oriented to person, place, and time. No cranial nerve deficit. Coordination normal.  Slight Left sided lip droop while speaking, however, when asked to smile- smile is symmetric. Able to stick tongue staight out w/o difficulty.  Speech is clear, able to follow 2 step commands. Finger to nose coordination normal. Normal gait.   Skin: Skin is warm and dry. No rash noted. She is not diaphoretic. No erythema.  Nursing note and vitals reviewed.   ED Course  Procedures (including critical care time)  Labs Review Labs Reviewed - No data to display  Imaging Review No results found.    MDM   1. Dizziness   2. Otalgia, right    Pt c/o ear pain for 3 days, dizziness that started earlier this afternoon. No red flag symptoms. Normal neuro exam.  Normal orthostatic vitals.    Tympanogram: Normal in Left and Right ears.  Dizziness may be due to mild vertigo.  Will try trial of Meclizine. Encouraged to f/u with PCP if not improving. Discussed symptoms that warrant emergent care in the ED. Patient verbalized understanding and agreement with treatment plan.     Noland Fordyce, PA-C 12/20/15 [phone]

## 2015-12-19 NOTE — Discharge Instructions (Signed)
Benign Positional Vertigo °Vertigo is the feeling that you or your surroundings are moving when they are not. Benign positional vertigo is the most common form of vertigo. The cause of this condition is not serious (is benign). This condition is triggered by certain movements and positions (is positional). This condition can be dangerous if it occurs while you are doing something that could endanger you or others, such as driving.  °CAUSES °In many cases, the cause of this condition is not known. It may be caused by a disturbance in an area of the inner ear that helps your brain to sense movement and balance. This disturbance can be caused by a viral infection (labyrinthitis), head injury, or repetitive motion. °RISK FACTORS °This condition is more likely to develop in: °· Women. °· People who are 50 years of age or older. °SYMPTOMS °Symptoms of this condition usually happen when you move your head or your eyes in different directions. Symptoms may start suddenly, and they usually last for less than a minute. Symptoms may include: °· Loss of balance and falling. °· Feeling like you are spinning or moving. °· Feeling like your surroundings are spinning or moving. °· Nausea and vomiting. °· Blurred vision. °· Dizziness. °· Involuntary eye movement (nystagmus). °Symptoms can be mild and cause only slight annoyance, or they can be severe and interfere with daily life. Episodes of benign positional vertigo may return (recur) over time, and they may be triggered by certain movements. Symptoms may improve over time. °DIAGNOSIS °This condition is usually diagnosed by medical history and a physical exam of the head, neck, and ears. You may be referred to a health care provider who specializes in ear, nose, and throat (ENT) problems (otolaryngologist) or a provider who specializes in disorders of the nervous system (neurologist). You may have additional testing, including: °· MRI. °· A CT scan. °· Eye movement tests. Your  health care provider may ask you to change positions quickly while he or she watches you for symptoms of benign positional vertigo, such as nystagmus. Eye movement may be tested with an electronystagmogram (ENG), caloric stimulation, the Dix-Hallpike test, or the roll test. °· An electroencephalogram (EEG). This records electrical activity in your brain. °· Hearing tests. °TREATMENT °Usually, your health care provider will treat this by moving your head in specific positions to adjust your inner ear back to normal. Surgery may be needed in severe cases, but this is rare. In some cases, benign positional vertigo may resolve on its own in 2-4 weeks. °HOME CARE INSTRUCTIONS °Safety °· Move slowly. Avoid sudden body or head movements. °· Avoid driving. °· Avoid operating heavy machinery. °· Avoid doing any tasks that would be dangerous to you or others if a vertigo episode would occur. °· If you have trouble walking or keeping your balance, try using a cane for stability. If you feel dizzy or unstable, sit down right away. °· Return to your normal activities as told by your health care provider. Ask your health care provider what activities are safe for you. °General Instructions °· Take over-the-counter and prescription medicines only as told by your health care provider. °· Avoid certain positions or movements as told by your health care provider. °· Drink enough fluid to keep your urine clear or pale yellow. °· Keep all follow-up visits as told by your health care provider. This is important. °SEEK MEDICAL CARE IF: °· You have a fever. °· Your condition gets worse or you develop new symptoms. °· Your family or friends   notice any behavioral changes.  Your nausea or vomiting gets worse.  You have numbness or a "pins and needles" sensation. SEEK IMMEDIATE MEDICAL CARE IF:  You have difficulty speaking or moving.  You are always dizzy.  You faint.  You develop severe headaches.  You have weakness in your  legs or arms.  You have changes in your hearing or vision.  You develop a stiff neck.  You develop sensitivity to light.   This information is not intended to replace advice given to you by your health care provider. Make sure you discuss any questions you have with your health care provider.   Document Released: 05/28/2006 Document Revised: 05/11/2015 Document Reviewed: 12/13/2014 Elsevier Interactive Patient Education 2016 [employer]  Dizziness Dizziness is a common problem. It makes you feel unsteady or lightheaded. You may feel like you are about to pass out (faint). Dizziness can lead to injury if you stumble or fall. Anyone can get dizzy, but dizziness is more common in older adults. This condition can be caused by a number of things, including:  Medicines.  Dehydration.  Illness. HOME CARE Following these instructions may help with your condition: Eating and Drinking  Drink enough fluid to keep your pee (urine) clear or pale yellow. This helps to keep you from getting dehydrated. Try to drink more clear fluids, such as water.  Do not drink alcohol.  Limit how much caffeine you drink or eat if told by your doctor.  Limit how much salt you drink or eat if told by your doctor. Activity  Avoid making quick movements.  When you stand up from sitting in a chair, steady yourself until you feel okay.  In the morning, first sit up on the side of the bed. When you feel okay, stand slowly while you hold onto something. Do this until you know that your balance is fine.  Move your legs often if you need to stand in one place for a long time. Tighten and relax your muscles in your legs while you are standing.  Do not drive or use heavy machinery if you feel dizzy.  Avoid bending down if you feel dizzy. Place items in your home so that they are easy for you to reach without leaning over. Lifestyle  Do not use any tobacco products, including cigarettes, chewing tobacco, or  electronic cigarettes. If you need help quitting, ask your doctor.  Try to lower your stress level, such as with yoga or meditation. Talk with your doctor if you need help. General Instructions  Watch your dizziness for any changes.  Take medicines only as told by your doctor. Talk with your doctor if you think that your dizziness is caused by a medicine that you are taking.  Tell a friend or a family member that you are feeling dizzy. If he or she notices any changes in your behavior, have this person call your doctor.  Keep all follow-up visits as told by your doctor. This is important. GET HELP IF:  Your dizziness does not go away.  Your dizziness or light-headedness gets worse.  You feel sick to your stomach (nauseous).  You have trouble hearing.  You have new symptoms.  You are unsteady on your feet or you feel like the room is spinning. GET HELP RIGHT AWAY IF:  You throw up (vomit) or have diarrhea and are unable to eat or drink anything.  You have trouble:  Talking.  Walking.  Swallowing.  Using your arms, hands, or legs.  You feel generally weak.  You are not thinking clearly or you have trouble forming sentences. It may take a friend or family member to notice this.  You have:  Chest pain.  Pain in your belly (abdomen).  Shortness of breath.  Sweating.  Your vision changes.  You are bleeding.  You have a headache.  You have neck pain or a stiff neck.  You have a fever.   This information is not intended to replace advice given to you by your health care provider. Make sure you discuss any questions you have with your health care provider.   Document Released: 08/09/2011 Document Revised: 01/04/2015 Document Reviewed: 08/16/2014 Elsevier Interactive Patient Education 2016 Elsevier Inc.  Benign Positional Vertigo Vertigo is the feeling that you or your surroundings are moving when they are not. Benign positional vertigo is the most common  form of vertigo. The cause of this condition is not serious (is benign). This condition is triggered by certain movements and positions (is positional). This condition can be dangerous if it occurs while you are doing something that could endanger you or others, such as driving.  CAUSES In many cases, the cause of this condition is not known. It may be caused by a disturbance in an area of the inner ear that helps your brain to sense movement and balance. This disturbance can be caused by a viral infection (labyrinthitis), head injury, or repetitive motion. RISK FACTORS This condition is more likely to develop in:  Women.  People who are 62 years of age or older. SYMPTOMS Symptoms of this condition usually happen when you move your head or your eyes in different directions. Symptoms may start suddenly, and they usually last for less than a minute. Symptoms may include:  Loss of balance and falling.  Feeling like you are spinning or moving.  Feeling like your surroundings are spinning or moving.  Nausea and vomiting.  Blurred vision.  Dizziness.  Involuntary eye movement (nystagmus). Symptoms can be mild and cause only slight annoyance, or they can be severe and interfere with daily life. Episodes of benign positional vertigo may return (recur) over time, and they may be triggered by certain movements. Symptoms may improve over time. DIAGNOSIS This condition is usually diagnosed by medical history and a physical exam of the head, neck, and ears. You may be referred to a health care provider who specializes in ear, nose, and throat (ENT) problems (otolaryngologist) or a provider who specializes in disorders of the nervous system (neurologist). You may have additional testing, including:  MRI.  A CT scan.  Eye movement tests. Your health care provider may ask you to change positions quickly while he or she watches you for symptoms of benign positional vertigo, such as nystagmus. Eye  movement may be tested with an electronystagmogram (ENG), caloric stimulation, the Dix-Hallpike test, or the roll test.  An electroencephalogram (EEG). This records electrical activity in your brain.  Hearing tests. TREATMENT Usually, your health care provider will treat this by moving your head in specific positions to adjust your inner ear back to normal. Surgery may be needed in severe cases, but this is rare. In some cases, benign positional vertigo may resolve on its own in 2-4 weeks. HOME CARE INSTRUCTIONS Safety  Move slowly.Avoid sudden body or head movements.  Avoid driving.  Avoid operating heavy machinery.  Avoid doing any tasks that would be dangerous to you or others if a vertigo episode would occur.  If you have trouble walking or  keeping your balance, try using a cane for stability. If you feel dizzy or unstable, sit down right away.  Return to your normal activities as told by your health care provider. Ask your health care provider what activities are safe for you. General Instructions  Take over-the-counter and prescription medicines only as told by your health care provider.  Avoid certain positions or movements as told by your health care provider.  Drink enough fluid to keep your urine clear or pale yellow.  Keep all follow-up visits as told by your health care provider. This is important. SEEK MEDICAL CARE IF:  You have a fever.  Your condition gets worse or you develop new symptoms.  Your family or friends notice any behavioral changes.  Your nausea or vomiting gets worse.  You have numbness or a "pins and needles" sensation. SEEK IMMEDIATE MEDICAL CARE IF:  You have difficulty speaking or moving.  You are always dizzy.  You faint.  You develop severe headaches.  You have weakness in your legs or arms.  You have changes in your hearing or vision.  You develop a stiff neck.  You develop sensitivity to light.   This information is not  intended to replace advice given to you by your health care provider. Make sure you discuss any questions you have with your health care provider.   Document Released: 05/28/2006 Document Revised: 05/11/2015 Document Reviewed: 12/13/2014 Elsevier Interactive Patient Education [employer].

## 2015-12-19 NOTE — ED Notes (Signed)
Pt started with ear pain Saturday, left worse than right.  Started with dizziness this afternoon.

## 2015-12-20 ENCOUNTER — Telehealth: Payer: Self-pay | Admitting: *Deleted

## 2016-01-25 ENCOUNTER — Encounter: Payer: Self-pay | Admitting: Family Medicine

## 2016-01-25 DIAGNOSIS — R1033 Periumbilical pain: Secondary | ICD-10-CM | POA: Insufficient documentation

## 2016-03-29 ENCOUNTER — Other Ambulatory Visit: Payer: Self-pay | Admitting: Family Medicine

## 2016-03-29 DIAGNOSIS — F411 Generalized anxiety disorder: Secondary | ICD-10-CM

## 2016-04-11 ENCOUNTER — Encounter: Payer: Self-pay | Admitting: Family Medicine

## 2016-04-11 ENCOUNTER — Ambulatory Visit (INDEPENDENT_AMBULATORY_CARE_PROVIDER_SITE_OTHER): Payer: 59 | Admitting: Family Medicine

## 2016-04-11 VITALS — BP 109/75 | HR 75 | Wt 182.0 lb

## 2016-04-11 DIAGNOSIS — F411 Generalized anxiety disorder: Secondary | ICD-10-CM | POA: Diagnosis not present

## 2016-04-11 MED ORDER — ESCITALOPRAM OXALATE 10 MG PO TABS: ORAL_TABLET | ORAL | 0 refills | Status: DC

## 2016-04-11 MED ORDER — DULOXETINE HCL 30 MG PO CPEP: 30.0000 mg | ORAL_CAPSULE | Freq: Every day | ORAL | 3 refills | Status: DC

## 2016-04-11 NOTE — Progress Notes (Signed)
CC: Denise Craig is a 45 y.o. female is here for Anxiety   Subjective: HPI:  Over the past month she's had sensations of nervousness, irritability, and worrying about all the crying that is going on in our [place]. She still works as a Production designer, theatre/television/film and things of increasing the past month. She is now having physical symptoms of headaches, upset stomach and dread of having to go to work every day. Lexapro does not seem to be helping any of this. She's is not self-medicating. She took some time off over the weekend for a vacation and it didn't help at all. Her director has proposed that she take 2-3 weeks off, she is open to the idea of trying a different medication for anxiety. Symptoms are moderate in severity on a daily basis and worsening on a weekly basis.   Review Of Systems Outlined In HPI  Past Medical History:  Diagnosis Date  . Anxiety   . Asthma    has not used inhaler in past 10 months-will bring dos  . B12 deficiency   . Depression   . GERD (gastroesophageal reflux disease)    w/ moderate hiatal hernia (EGD 3-09), Dr August Saucer prn, prilosec  . Infertility, female   . Iron deficiency   . Migraine    sleep problems- sees Dr Berdine Addison, EEG normal 12-07  . Nephrolithiasis   . PONV (postoperative nausea and vomiting)   . 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 Uterine polyp     Past Surgical History:  Procedure Laterality Date  . APPENDECTOMY    . endometrial ablation spring  2008  . GASTRIC BYPASS  2013  . HYSTEROSCOPY WITH NOVASURE  08/12/2012   Procedure: HYSTEROSCOPY WITH NOVASURE;  Surgeon: Marylynn Pearson, MD;  Location: [facility];  Service: Gynecology;  Laterality: N/A;  . LAPAROSCOPIC TUBAL LIGATION  08/12/2012   Procedure: LAPAROSCOPIC TUBAL LIGATION;  Surgeon: Marylynn Pearson, MD;  Location: [facility];  Service: Gynecology;  Laterality: Bilateral;  WITH FILSHIE CLIPS  . right knee lateral release surgery  2011  . TONSILLECTOMY    . tumor removed  2012   removed right ankle   Family History  Problem Relation Age of Onset  . Hypertension Mother   . Diabetes Mother   . Cancer Father     metastatic, colon ca  . Breast cancer Mother     Social History   Social History  . Marital status: Married    Spouse name: N/A  . Number of children: N/A  . Years of education: N/A   Occupational History  . Not on file.   Social History Main Topics  . Smoking status: Never Smoker  . Smokeless tobacco: Not on file  . Alcohol use No  . Drug use: No  . Sexual activity: Not on file   Other Topics Concern  . Not on file   Social History Narrative  . No narrative on file     Objective: BP 109/75   Pulse 75   Wt 182 lb (82.6 kg)   BMI 28.94 kg/m   Vital signs reviewed. General: Alert and Oriented, No Acute Distress HEENT: Pupils equal, round, reactive to light. Conjunctivae clear.  External ears unremarkable.  Moist mucous membranes. Lungs: Clear and comfortable work of breathing, speaking in full sentences without accessory muscle use. Cardiac: Regular rate and rhythm.  Neuro: CN II-XII grossly intact, gait normal. Extremities: No peripheral edema.  Strong peripheral pulses.  Mental Status: No depression, anxiety, nor  agitation. Logical though process. Skin: Warm and dry.  Assessment & Plan: [name] was seen today for anxiety.  Diagnoses and all orders for this visit:  Anxiety state  Other orders -     DULoxetine (CYMBALTA) 30 MG capsule; Take 1 capsule (30 mg total) by mouth daily. -     escitalopram (LEXAPRO) 10 MG tablet; One by mouth daily for five days then half a tab daily for six days.   Agree that it sounds necessary to remove herself from her current situation temporarily while we adjust some of her medications. She is tapering down on Lexapro and will begin Cymbalta as soon as it is available. She's been on beginning a family sometime next week, I'll call her once I get the FMLA forms to make sure dates are correct.  Follow-up with me in 2-3 weeks.  Return in about 4 weeks (around 05/09/2016). Discussed with this patient that I will be resigning from my position here with [employer] in September in order to stay with my family who will be moving to [facility]. I let him know about the providers that are still accepting patients and I feel that this individual will be under great care if he/she stays here with [facility].

## 2016-04-13 ENCOUNTER — Telehealth: Payer: Self-pay | Admitting: Family Medicine

## 2016-04-13 NOTE — Telephone Encounter (Signed)
Left vm for pt to callback 

## 2016-04-13 NOTE — Telephone Encounter (Signed)
Will you please let patient know that I'm in the process of filling out her FMLA paperwork and just to make sure I don't put the wrong dates down can she please let me know exactly what dates she plans on being off.  I had proposed somewhere between 2-3 weeks off of work.

## 2016-04-18 NOTE — Telephone Encounter (Signed)
Forms faxed

## 2016-04-18 NOTE — Telephone Encounter (Signed)
Pt stated that she will be out of starting today 04/18/16 until she comes back to see on 05/09/16.  She will go return back to work on 05/18/16.

## 2016-04-18 NOTE — Telephone Encounter (Signed)
Thank you, completed fmla papers in your in box.

## 2016-04-22 ENCOUNTER — Emergency Department (INDEPENDENT_AMBULATORY_CARE_PROVIDER_SITE_OTHER): Payer: 59

## 2016-04-22 ENCOUNTER — Emergency Department
Admission: EM | Admit: 2016-04-22 | Discharge: 2016-04-22 | Disposition: A | Discharge status: Home / Self Care | Payer: 59 | Source: Home / Self Care | Attending: Family Medicine | Admitting: Family Medicine

## 2016-04-22 ENCOUNTER — Encounter: Payer: Self-pay | Admitting: Emergency Medicine

## 2016-04-22 DIAGNOSIS — R1012 Left upper quadrant pain: Secondary | ICD-10-CM

## 2016-04-22 DIAGNOSIS — R109 Unspecified abdominal pain: Secondary | ICD-10-CM

## 2016-04-22 LAB — POCT URINALYSIS DIP (MANUAL ENTRY)
BILIRUBIN UA: NEGATIVE
Bilirubin, UA: NEGATIVE
Blood, UA: NEGATIVE
Glucose, UA: NEGATIVE
LEUKOCYTES UA: NEGATIVE
NITRITE UA: NEGATIVE
PROTEIN UA: NEGATIVE
Spec Grav, UA: 1.02 (ref 1.005–1.03)
UROBILINOGEN UA: 0.2 (ref 0–1)
pH, UA: 6 (ref 5–8)

## 2016-04-22 LAB — POCT URINE PREGNANCY: PREG TEST UR: NEGATIVE

## 2016-04-22 MED ORDER — HYDROCODONE-ACETAMINOPHEN 5-325 MG PO TABS: 1.0000 | ORAL_TABLET | Freq: Four times a day (QID) | ORAL | 0 refills | Status: DC | PRN

## 2016-04-22 MED ORDER — ONDANSETRON 4 MG PO TBDP: ORAL_TABLET | ORAL | 0 refills | Status: DC

## 2016-04-22 MED ORDER — CEPHALEXIN 500 MG PO CAPS: 500.0000 mg | ORAL_CAPSULE | Freq: Two times a day (BID) | ORAL | 0 refills | Status: DC

## 2016-04-22 MED ORDER — KETOROLAC TROMETHAMINE 30 MG/ML IJ SOLN: 30.0000 mg | Freq: Once | INTRAMUSCULAR | Status: DC

## 2016-04-22 NOTE — ED Provider Notes (Signed)
Denise Craig CARE    CSN: [id number] Arrival date & time: 04/22/16  1212  First Provider Contact:  First MD Initiated Contact with Patient 04/22/16 1255        History   Chief Complaint Chief Complaint  Patient presents with  . Urinary Urgency  . Dysuria    HPI Denise Craig is a 45 y.o. female.   Patient awoke this morning with non-radiating left lower back ache and urinary urgency without dysuria.  She has a past history of multiple right kidney stones, one of which required surgical extraction.  She states that her present symptoms are similar to past right kidney stones.  She has mild nausea without vomiting.   The history is provided by the patient.    Past Medical History:  Diagnosis Date  . Anxiety   . Asthma    has not used inhaler in past 10 months-will bring dos  . B12 deficiency   . Depression   . GERD (gastroesophageal reflux disease)    w/ moderate hiatal hernia (EGD 3-09), Dr August Saucer prn, prilosec  . Infertility, female   . Iron deficiency   . Migraine    sleep problems- sees Dr Berdine Addison, EEG normal 12-07  . Nephrolithiasis   . PONV (postoperative nausea and vomiting)   . 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 Uterine polyp     Patient Active Problem List   Diagnosis Date Noted  . Periumbilical abdominal pain 01/25/2016  . Nephrolithiasis 07/14/2013  . GERD (gastroesophageal reflux disease) 09/17/2012  . Essential hypertension, benign 02/27/2011  . IBS 06/16/2009  . INSOMNIA 07/15/2008  . HIATAL HERNIA 12/22/2007  . THYROMEGALY 12/11/2007  . ANEMIA, VITAMIN B12 DEFICIENCY 06/04/2007  . ANEMIA, IRON DEFICIENCY NOS 05/16/2007  . ASTHMA, PERSISTENT, MODERATE 05/16/2007  . OBESITY, NOS 06/11/2006  . Anxiety state 06/11/2006  . MIGRAINE, UNSPEC., W/O INTRACTABLE MIGRAINE 06/11/2006  . ENDOMETRIOSIS 06/11/2006    Past Surgical History:  Procedure Laterality Date  . APPENDECTOMY    . endometrial ablation spring  2008  .  GASTRIC BYPASS  2013  . HYSTEROSCOPY WITH NOVASURE  08/12/2012   Procedure: HYSTEROSCOPY WITH NOVASURE;  Surgeon: Marylynn Pearson, MD;  Location: [facility];  Service: Gynecology;  Laterality: N/A;  . LAPAROSCOPIC TUBAL LIGATION  08/12/2012   Procedure: LAPAROSCOPIC TUBAL LIGATION;  Surgeon: Marylynn Pearson, MD;  Location: [facility];  Service: Gynecology;  Laterality: Bilateral;  WITH FILSHIE CLIPS  . right knee lateral release surgery  2011  . TONSILLECTOMY    . tumor removed  2012   removed right ankle    OB History    No data available       Home Medications    Prior to Admission medications   Medication Sig Start Date End Date Taking? Authorizing Provider  cephALEXin (KEFLEX) 500 MG capsule Take 1 capsule (500 mg total) by mouth 2 (two) times daily. 04/22/16   Kandra Nicolas, MD  doxepin (SINEQUAN) 25 MG capsule  04/03/16   Historical Provider, MD  DULoxetine (CYMBALTA) 30 MG capsule Take 1 capsule (30 mg total) by mouth daily. 04/11/16   Sean Hommel, DO  escitalopram (LEXAPRO) 10 MG tablet One by mouth daily for five days then half a tab daily for six days. 04/11/16   Marcial Pacas, DO  HYDROcodone-acetaminophen (NORCO/VICODIN) 5-325 MG tablet Take 1 tablet by mouth every 6 (six) hours as needed for moderate pain. 04/22/16   Kandra Nicolas, MD  omeprazole (PRILOSEC) 20 MG capsule  Take 1 capsule by mouth  twice daily 09/28/15   Historical Provider, MD  SUMAtriptan (IMITREX) 20 MG/ACT nasal spray Place 1 spray (20 mg total) into the nose as needed for migraine. Patient not taking: Reported on 04/05/2015 01/18/12 07/14/13  Hali Marry, MD    Family History Family History  Problem Relation Age of Onset  . Cancer Father     metastatic, colon ca  . Hypertension Mother   . Diabetes Mother   . Breast cancer Mother     Social History Social History  Substance Use Topics  . Smoking status: Never Smoker  . Smokeless tobacco: Never Used  . Alcohol use No     Allergies   Morphine and  related and Prednisone   Review of Systems Review of Systems  Constitutional: Positive for appetite change. Negative for chills, diaphoresis, fatigue and fever.  HENT: Negative.   Eyes: Negative.   Respiratory: Negative.   Cardiovascular: Negative.   Gastrointestinal: Positive for nausea. Negative for abdominal pain and vomiting.  Genitourinary: Positive for decreased urine volume, difficulty urinating, flank pain, frequency, pelvic pain and urgency. Negative for dysuria and hematuria.  Musculoskeletal: Positive for back pain.  Skin: Negative.   Neurological: Negative for headaches.     Physical Exam Triage Vital Signs ED Triage Vitals  Enc Vitals Group     BP 04/22/16 1239 122/77     Pulse Rate 04/22/16 1239 68     Resp 04/22/16 1239 16     Temp 04/22/16 1239 98 F (36.7 C)     Temp Source 04/22/16 1239 Oral     SpO2 04/22/16 1239 100 %     Weight 04/22/16 1240 180 lb (81.6 kg)     Height 04/22/16 1240 5\' 6"  (1.676 m)     Head Circumference --      Peak Flow --      Pain Score 04/22/16 1241 6     Pain Loc --      Pain Edu? --      Excl. in [place]? --    No data found.   Updated Vital Signs BP 122/77 (BP Location: Left Arm)   Pulse 68   Temp 98 F (36.7 C) (Oral)   Resp 16   Ht 5\' 6"  (1.676 m)   Wt 180 lb (81.6 kg)   SpO2 100%   BMI 29.05 kg/m   Visual Acuity Right Eye Distance:   Left Eye Distance:   Bilateral Distance:    Right Eye Near:   Left Eye Near:    Bilateral Near:     Physical Exam  Constitutional: She is oriented to person, place, and time. She appears well-developed and well-nourished. No distress.  HENT:  Head: Normocephalic.  Mouth/Throat: Oropharynx is clear and moist.  Eyes: Pupils are equal, round, and reactive to light.  Neck: Neck supple.  Cardiovascular: Normal heart sounds.   Pulmonary/Chest: Breath sounds normal.  Abdominal: Soft. Bowel sounds are normal. There is no tenderness.  Musculoskeletal: She exhibits no edema.        Lumbar back: She exhibits tenderness. She exhibits no bony tenderness.       Back:  Lymphadenopathy:    She has no cervical adenopathy.  Neurological: She is alert and oriented to person, place, and time.  Skin: Skin is warm and dry. No rash noted.  Nursing note and vitals reviewed.    UC Treatments / Results  Labs (all labs ordered are listed, but only abnormal results are displayed)  Labs Reviewed  URINE CULTURE  POCT URINALYSIS DIP (MANUAL ENTRY)  POCT URINE PREGNANCY    EKG  EKG Interpretation None       Radiology Dg Abdomen 1 View  Result Date: 04/22/2016 CLINICAL DATA:  Left flank pain upon waking today. EXAM: ABDOMEN - 1 VIEW COMPARISON:  Abdominal radiograph from upper GI 2015. FINDINGS: Bowel gas pattern is normal. Previous cholecystectomy. Previous surgery in the region of the stomach. Previous tubal ligation. No calcifications overlying the location of the kidneys. Numerous calcifications in the pelvis which are indeterminate for phleboliths versus ureteral calculi. Lower lumbar degenerative changes incidentally noted. IMPRESSION: Multiple calcifications in the pelvis, indeterminate for phleboliths versus ureteral calculi. Particularly, an ovoid density on the left was not demonstrated on the previous abdominal radiograph, though the lower pelvis was not definitely completely included. Electronically Signed   By: Nelson Chimes M.D.   On: 04/22/2016 13:57    Procedures Procedures (including critical care time)  Medications Ordered in UC Medications  ketorolac (TORADOL) 30 MG/ML injection 30 mg (not administered)     Initial Impression / Assessment and Plan / UC Course  I have reviewed the triage vital signs and the nursing notes.  Pertinent labs & imaging results that were available during my care of the patient were reviewed by me and considered in my medical decision making (see chart for details).  Clinical Course   Note normal urinalysis, and negative KUB  for calcifications overlying the location of the kidneys. Will obtain urine culture, and begin empiric Keflex 500mg  BID. Rx for Lortab for pain, and Zofran ODT for nausea Recommend follow-up with urologist as soon as possible.  If symptoms become significantly worse during the night or over the weekend, proceed to the local emergency room.    Final Clinical Impressions(s) / UC Diagnoses   Final diagnoses:  Acute left flank pain    New Prescriptions New Prescriptions   CEPHALEXIN (KEFLEX) 500 MG CAPSULE    Take 1 capsule (500 mg total) by mouth 2 (two) times daily.   HYDROCODONE-ACETAMINOPHEN (NORCO/VICODIN) 5-325 MG TABLET    Take 1 tablet by mouth every 6 (six) hours as needed for moderate pain.     Kandra Nicolas, MD 04/22/16 1438

## 2016-04-22 NOTE — ED Triage Notes (Signed)
Patient awoke today with urgency of urination, pressure and inability to urinate/empty; has pain across lower left back area that radiates down. No known muscular injury.Strong history of kidney stones.

## 2016-04-22 NOTE — Discharge Instructions (Signed)
Increase fluid intake. °If symptoms become significantly worse during the night or over the weekend, proceed to the local emergency room.  °

## 2016-04-23 LAB — URINE CULTURE

## 2016-04-24 ENCOUNTER — Telehealth: Payer: Self-pay | Admitting: *Deleted

## 2016-04-24 NOTE — Telephone Encounter (Signed)
Pt reports she is not much improved. UCX results given. She saw her urologist today and is "waiting to hear back from them". Advised to notify urologist of [place] result.

## 2016-05-01 ENCOUNTER — Encounter: Payer: Self-pay | Admitting: Family Medicine

## 2016-05-01 MED ORDER — SULFAMETHOXAZOLE-TRIMETHOPRIM 800-160 MG PO TABS: 1.0000 | ORAL_TABLET | Freq: Two times a day (BID) | ORAL | 0 refills | Status: DC

## 2016-05-09 ENCOUNTER — Encounter: Payer: Self-pay | Admitting: Family Medicine

## 2016-05-09 ENCOUNTER — Ambulatory Visit (INDEPENDENT_AMBULATORY_CARE_PROVIDER_SITE_OTHER): Payer: 59 | Admitting: Family Medicine

## 2016-05-09 VITALS — BP 116/80 | HR 84 | Wt 176.0 lb

## 2016-05-09 DIAGNOSIS — F411 Generalized anxiety disorder: Secondary | ICD-10-CM | POA: Diagnosis not present

## 2016-05-09 DIAGNOSIS — Z23 Encounter for immunization: Secondary | ICD-10-CM

## 2016-05-09 MED ORDER — DULOXETINE HCL 60 MG PO CPEP: 60.0000 mg | ORAL_CAPSULE | Freq: Every day | ORAL | 2 refills | Status: DC

## 2016-05-09 NOTE — Progress Notes (Signed)
CC: Denise Craig is a 45 y.o. female is here for Anxiety   Subjective: HPI:  Follow-up anxiety: She is tolerating Cymbalta and believes that it might be helping a little bit. Anxiety is only slightly improved. 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 She denies any depression or any other mental disturbance. Symptoms are still mild in severity most days out of the week   Review Of Systems Outlined In HPI  Past Medical History:  Diagnosis Date  . Anxiety   . Asthma    has not used inhaler in past 10 months-will bring dos  . B12 deficiency   . Depression   . GERD (gastroesophageal reflux disease)    w/ moderate hiatal hernia (EGD 3-09), Dr August Saucer prn, prilosec  . Infertility, female   . Iron deficiency   . Migraine    sleep problems- sees Dr Berdine Addison, EEG normal 12-07  . Nephrolithiasis   . PONV (postoperative nausea and vomiting)   . 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 Uterine polyp     Past Surgical History:  Procedure Laterality Date  . APPENDECTOMY    . endometrial ablation spring  2008  . GASTRIC BYPASS  2013  . HYSTEROSCOPY WITH NOVASURE  08/12/2012   Procedure: HYSTEROSCOPY WITH NOVASURE;  Surgeon: Marylynn Pearson, MD;  Location: [facility];  Service: Gynecology;  Laterality: N/A;  . LAPAROSCOPIC TUBAL LIGATION  08/12/2012   Procedure: LAPAROSCOPIC TUBAL LIGATION;  Surgeon: Marylynn Pearson, MD;  Location: [facility];  Service: Gynecology;  Laterality: Bilateral;  WITH FILSHIE CLIPS  . right knee lateral release surgery  2011  . TONSILLECTOMY    . tumor removed  2012   removed right ankle   Family History  Problem Relation Age of Onset  . Cancer Father     metastatic, colon ca  . Hypertension Mother   . Diabetes Mother   . Breast cancer Mother     Social History   Social History  . Marital status: Married    Spouse name: N/A  . Number of children: N/A  . Years of education: N/A    Occupational History  . Not on file.   Social History Main Topics  . Smoking status: Never Smoker  . Smokeless tobacco: Never Used  . Alcohol use No  . Drug use: No  . Sexual activity: Not on file   Other Topics Concern  . Not on file   Social History Narrative  . No narrative on file     Objective: BP 116/80   Pulse 84   Wt 176 lb (79.8 kg)   BMI 28.41 kg/m   Vital signs reviewed. General: Alert and Oriented, No Acute Distress HEENT: Pupils equal, round, reactive to light. Conjunctivae clear.  External ears unremarkable.  Moist mucous membranes. Lungs: Clear and comfortable work of breathing, speaking in full sentences without accessory muscle use. Cardiac: Regular rate and rhythm.  Neuro: CN II-XII grossly intact, gait normal. Extremities: No peripheral edema.  Strong peripheral pulses.  Mental Status: No depression, anxiety, nor agitation. Logical though process. Skin: Warm and dry. Assessment & Plan: [name] was seen today for anxiety.  Diagnoses and all orders for this visit:  Anxiety state  Other orders -     DULoxetine (CYMBALTA) 60 MG capsule; Take 1 capsule (60 mg total) by mouth daily.   Anxiety: Improving, she is going to return back to work on Monday and  tomorrow will start on a larger dose of Cymbalta in hopes of getting a more robust response to this medication.  Discussed with this patient that I will be resigning from my position here with [employer] in September in order to stay with my family who will be moving to [facility]. I let him know about the providers that are still accepting patients and I feel that this individual will be under great care if he/she stays here with [facility].  Return in about 4 weeks (around 06/06/2016) for Mood Follow Up Luvenia Starch?).

## 2016-06-01 ENCOUNTER — Encounter: Payer: Self-pay | Admitting: Physician Assistant

## 2016-06-01 ENCOUNTER — Ambulatory Visit (INDEPENDENT_AMBULATORY_CARE_PROVIDER_SITE_OTHER): Payer: 59 | Admitting: Physician Assistant

## 2016-06-01 VITALS — BP 122/83 | HR 93 | Wt 176.0 lb

## 2016-06-01 DIAGNOSIS — F32A Depression, unspecified: Secondary | ICD-10-CM

## 2016-06-01 DIAGNOSIS — F329 Major depressive disorder, single episode, unspecified: Secondary | ICD-10-CM | POA: Insufficient documentation

## 2016-06-01 DIAGNOSIS — F419 Anxiety disorder, unspecified: Principal | ICD-10-CM

## 2016-06-01 DIAGNOSIS — Z9884 Bariatric surgery status: Secondary | ICD-10-CM | POA: Insufficient documentation

## 2016-06-01 DIAGNOSIS — F418 Other specified anxiety disorders: Secondary | ICD-10-CM

## 2016-06-01 MED ORDER — DULOXETINE HCL 30 MG PO CPEP: 30.0000 mg | ORAL_CAPSULE | Freq: Every day | ORAL | 0 refills | Status: DC

## 2016-06-01 MED ORDER — VORTIOXETINE HBR 5 MG PO TABS: ORAL_TABLET | ORAL | 0 refills | Status: DC

## 2016-06-01 NOTE — Progress Notes (Addendum)
Subjective:     Patient ID: Denise Craig, female   DOB: [birth year], 45 y.o.   MRN: [id number]  HPI Patient is a 45 y.o. Caucasian female presenting today to switch from Dr. Ileene Rubens and with complaints of increased irritability and anxiety. 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 She states that she no longer wants to go to work and feels like she wants another job. The patient notes that she was previously taking Lexapro but without a good response and has since discontinued its use. She states that she has been taking Cymbalta and Doxepin nightly which helps her sleep and with her GI issues. 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 The patient denies hallucinations/delusions, self-injury, homicidal or suicidal ideation, chest pain, shortness of breath, or palpitations.  Review of Systems  Constitutional: Negative for activity change, appetite change, chills, fatigue, fever and unexpected weight change.  HENT: Negative.   Eyes: Negative.   Respiratory: Negative for cough, chest tightness, shortness of breath and wheezing.   Cardiovascular: Negative for chest pain, palpitations and leg swelling.  Gastrointestinal: Negative.   Genitourinary: Negative.   Musculoskeletal: Negative.   Skin: Negative.   Allergic/Immunologic: Negative.   Neurological: Negative for dizziness, syncope, weakness, light-headedness, numbness and headaches.  Psychiatric/Behavioral: Positive for agitation, behavioral problems, dysphoric mood and sleep disturbance. Negative for confusion, decreased concentration, hallucinations, self-injury and suicidal ideas. The patient is nervous/anxious. The patient is not hyperactive.       Objective:   Physical Exam  Constitutional: She is oriented to person, place, and time. She appears well-developed and  well-nourished. No distress.  HENT:  Head: Normocephalic and atraumatic.  Eyes: Conjunctivae and EOM are normal. Pupils are equal, round, and reactive to light. Right eye exhibits no discharge. Left eye exhibits no discharge. No scleral icterus.  Neck: Normal range of motion. Neck supple.  Cardiovascular: Normal rate, regular rhythm, normal heart sounds and intact distal pulses.  Exam reveals no gallop and no friction rub.   No murmur heard. Pulmonary/Chest: Effort normal and breath sounds normal. No respiratory distress. She has no wheezes. She has no rales. She exhibits no tenderness.  Neurological: She is alert and oriented to person, place, and time. No cranial nerve deficit. Coordination normal.  Skin: Skin is warm and dry. No rash noted. She is not diaphoretic. No erythema. No pallor.  Psychiatric: She has a normal mood and affect. Her behavior is normal. Judgment and thought content normal.      Assessment:     Denise Craig was seen today for establish care and anxiety.  Diagnoses and all orders for this visit:  Anxiety and depression -     vortioxetine HBr (TRINTELLIX) 5 MG TABS; Take one tablet for 10 days then increase to 2 tablets daily. -     DULoxetine (CYMBALTA) 30 MG capsule; Take 1 capsule (30 mg total) by mouth daily.      Plan:     1. Anxiety and depression - Patient with PHQ-9 score of 7 and GAS-7 score of 7 in-clinic today. Patient has slight worsening of irritability symptoms. Patient given referral of psychology today for therapeutic management. Patient to discontinue cymbalta and initiate treatment with trintellix. Instructed patient to take cymbalta 30 and trintellix 5mg  for 10 days then increase to trintellix 10mg  daily and STOP  cymbalta. Patient to follow-up in 4-6 weeks for reassessment and medication management.

## 2016-06-01 NOTE — Addendum Note (Signed)
Addended by: Donella Stade on: 06/01/2016 04:29 PM   Modules accepted: Orders

## 2016-06-01 NOTE — Patient Instructions (Signed)
cymbalta 30 and trintellix 5mg  for 10 days then increast to trintellix 10mg  daily and STOP cymbalta.   Vortioxetine oral tablet What is this medicine? Vortioxetine (vor tee [employer] e teen) is used to treat depression. This medicine may be used for other purposes; ask your health care provider or pharmacist if you have questions. What should I tell my health care provider before I take this medicine? They need to know if you have any of these conditions: -bipolar disorder or a family history of bipolar disorder -bleeding disorders -drink alcohol -glaucoma -liver disease -low levels of sodium in the blood -seizures -suicidal thoughts, plans, or attempt; a previous suicide attempt by you or a family member -take medicines that treat or prevent blood clots -an unusual or allergic reaction to vortioxetine, other medicines, foods, dyes, or preservatives -pregnant or trying to get pregnant -breast-feeding How should I use this medicine? Take this medicine by mouth with a glass of water. Follow the directions on the prescription label. You can take it with or without food. If it upsets your stomach, take it with food. Take your medicine at regular intervals. Do not take it more often than directed. Do not stop taking this medicine suddenly except upon the advice of your doctor. Stopping this medicine too quickly may cause serious side effects or your condition may worsen. A special MedGuide will be given to you by the pharmacist with each prescription and refill. Be sure to read this information carefully each time. Talk to your pediatrician regarding the use of this medicine in children. Special care may be needed. Overdosage: If you think you have taken too much of this medicine contact a poison control center or emergency room at once. NOTE: This medicine is only for you. Do not share this medicine with others. What if I miss a dose? If you miss a dose, take it as soon as you can. If it is almost  time for your next dose, take only that dose. Do not take double or extra doses. What may interact with this medicine? Do not take this medicine with any of the following medications: -linezolid -MAOIs like Carbex, Eldepryl, Marplan, Nardil, and Parnate -methylene blue (injected into a vein) This medicine may also interact with the following medications: -alcohol -aspirin and aspirin-like medicines -carbamazepine -certain medicines for depression, anxiety, or psychotic disturbances -certain medicines for migraine headache like almotriptan, eletriptan, frovatriptan, naratriptan, rizatriptan, sumatriptan, zolmitriptan -diuretics -fentanyl -furazolidone -isoniazid -medicines that treat or prevent blood clots like warfarin, enoxaparin, and dalteparin -NSAIDs, medicines for pain and inflammation, like ibuprofen or naproxen -phenytoin -procarbazine -quinidine -rasagiline -rifampin -supplements like St. John's wort, kava kava, valerian -tramadol -tryptophan This list may not describe all possible interactions. Give your health care provider a list of all the medicines, herbs, non-prescription drugs, or dietary supplements you use. Also tell them if you smoke, drink alcohol, or use illegal drugs. Some items may interact with your medicine. What should I watch for while using this medicine? Tell your doctor if your symptoms do not get better or if they get worse. Visit your doctor or health care professional for regular checks on your progress. Because it may take several weeks to see the full effects of this medicine, it is important to continue your treatment as prescribed by your doctor. Patients and their families should watch out for new or worsening thoughts of suicide or depression. Also watch out for sudden changes in feelings such as feeling anxious, agitated, panicky, irritable, hostile, aggressive, impulsive, severely  restless, overly excited and hyperactive, or not being able to  sleep. If this happens, especially at the beginning of treatment or after a change in dose, call your health care professional. Dennis Bast may get drowsy or dizzy. Do not drive, use machinery, or do anything that needs mental alertness until you know how this medicine affects you. Do not stand or sit up quickly, especially if you are an older patient. This reduces the risk of dizzy or fainting spells. Alcohol may interfere with the effect of this medicine. Avoid alcoholic drinks. Your mouth may get dry. Chewing sugarless gum or sucking hard candy, and drinking plenty of water may help. Contact your doctor if the problem does not go away or is severe. What side effects may I notice from receiving this medicine? Side effects that you should report to your doctor or health care professional as soon as possible: -allergic reactions like skin rash, itching or hives, swelling of the face, lips, or tongue -confusion -fast talking and excited feelings or actions that are out of control -feeling faint or lightheaded, falls -hallucination, loss of contact with reality -seizures -suicidal thoughts or other mood changes -unusual bleeding or bruising -unusual body movements or muscle twitching -weakness Side effects that usually do not require medical attention (Report these to your doctor or health care professional if they continue or are bothersome.): -change in sex drive or performance -constipation -diarrhea -dizziness -dry mouth -nausea This list may not describe all possible side effects. Call your doctor for medical advice about side effects. You may report side effects to FDA at 1-800-FDA-1088. Where should I keep my medicine? Keep out of the reach of children. Store at room temperature between 15 and 30 degrees C (59 and 86 degrees F). Throw away any unused medicine after the expiration date. NOTE: This sheet is a summary. It may not cover all possible information. If you have questions about this  medicine, talk to your doctor, pharmacist, or health care provider.    2016, Elsevier/[employer] Standard. (2013-03-13 13:22:25)

## 2016-06-04 ENCOUNTER — Other Ambulatory Visit: Payer: Self-pay | Admitting: Physician Assistant

## 2016-06-04 ENCOUNTER — Telehealth: Payer: Self-pay | Admitting: *Deleted

## 2016-06-04 DIAGNOSIS — F419 Anxiety disorder, unspecified: Principal | ICD-10-CM

## 2016-06-04 DIAGNOSIS — F32A Depression, unspecified: Secondary | ICD-10-CM

## 2016-06-04 DIAGNOSIS — F329 Major depressive disorder, single episode, unspecified: Secondary | ICD-10-CM

## 2016-06-04 NOTE — Telephone Encounter (Signed)
PA initiated through covermymeds for trintellix Key: [employer]

## 2016-06-07 ENCOUNTER — Encounter: Payer: Self-pay | Admitting: Emergency Medicine

## 2016-06-07 ENCOUNTER — Emergency Department (INDEPENDENT_AMBULATORY_CARE_PROVIDER_SITE_OTHER)
Admission: EM | Admit: 2016-06-07 | Discharge: 2016-06-07 | Disposition: A | Discharge status: Home / Self Care | Payer: Self-pay | Source: Home / Self Care | Attending: Family Medicine | Admitting: Family Medicine

## 2016-06-07 DIAGNOSIS — W5501XA Bitten by cat, initial encounter: Secondary | ICD-10-CM

## 2016-06-07 DIAGNOSIS — S61452A Open bite of left hand, initial encounter: Secondary | ICD-10-CM

## 2016-06-07 MED ORDER — HYDROCODONE-ACETAMINOPHEN 5-325 MG PO TABS: 1.0000 | ORAL_TABLET | Freq: Four times a day (QID) | ORAL | 0 refills | Status: DC | PRN

## 2016-06-07 MED ORDER — AMOXICILLIN-POT CLAVULANATE 875-125 MG PO TABS: 1.0000 | ORAL_TABLET | Freq: Two times a day (BID) | ORAL | 0 refills | Status: DC

## 2016-06-07 MED ORDER — MUPIROCIN 2 % EX OINT: 1.0000 "application " | TOPICAL_OINTMENT | Freq: Three times a day (TID) | CUTANEOUS | 0 refills | Status: DC

## 2016-06-07 NOTE — Discharge Instructions (Signed)
Change dressing daily and apply Bactroban ointment to wound.  Keep wound clean and dry.  Return for any signs of infection (or follow-up with family doctor):  Increasing redness, swelling, pain, heat, drainage, etc.  Elevate hand.  Apply ice pack for 10 to 15 minutes every 2 to 3 hours until swelling decreases.

## 2016-06-07 NOTE — ED Triage Notes (Signed)
Patient just came from work where she was bitten several times in left hand by cat; all shots up to date; cleaned area with betadine.

## 2016-06-07 NOTE — ED Provider Notes (Signed)
[name]    CSN: [id number] Arrival date & time: 06/07/16  1812     History   Chief Complaint Chief Complaint  Patient presents with  . Animal Bite    HPI Denise Craig is a 45 y.o. female.   Patient presents for occupational injury.  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  The cat is not ill and rabies immunization current.  Patient's last Tdap 01/18/12.  Patient cleaned her left hand with Betadine.   The history is provided by the patient.  Hand Injury  Location:  Hand Hand location:  L hand Injury: yes   Time since incident:  90 minutes Mechanism of injury comment:  Cat bite Pain details:    Quality:  Aching   Radiates to:  Does not radiate   Severity:  Moderate   Onset quality:  Sudden   Duration:  1 hour   Timing:  Constant   Progression:  Unchanged Tetanus status:  Up to date Prior injury to area:  No Relieved by:  None tried Worsened by:  Movement Ineffective treatments:  None tried Associated symptoms: decreased range of motion, stiffness and swelling   Associated symptoms: no numbness and no tingling     Past Medical History:  Diagnosis Date  . Anxiety   . Asthma    has not used inhaler in past 10 months-will bring dos  . B12 deficiency   . Depression   . GERD (gastroesophageal reflux disease)    w/ moderate hiatal hernia (EGD 3-09), Dr August Saucer prn, prilosec  . Infertility, female   . Iron deficiency   . Migraine    sleep problems- sees Dr Berdine Addison, EEG normal 12-07  . Nephrolithiasis   . PONV (postoperative nausea and vomiting)   . 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 Uterine polyp     Patient Active Problem List   Diagnosis Date Noted  . 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 Anxiety and depression 06/01/2016  . Periumbilical abdominal pain 01/25/2016  . Nephrolithiasis 07/14/2013  . GERD (gastroesophageal reflux disease)  09/17/2012  . Essential hypertension, benign 02/27/2011  . IBS 06/16/2009  . INSOMNIA 07/15/2008  . HIATAL HERNIA 12/22/2007  . THYROMEGALY 12/11/2007  . ANEMIA, VITAMIN B12 DEFICIENCY 06/04/2007  . ANEMIA, IRON DEFICIENCY NOS 05/16/2007  . ASTHMA, PERSISTENT, MODERATE 05/16/2007  . OBESITY, NOS 06/11/2006  . Anxiety state 06/11/2006  . MIGRAINE, UNSPEC., W/O INTRACTABLE MIGRAINE 06/11/2006  . ENDOMETRIOSIS 06/11/2006    Past Surgical History:  Procedure Laterality Date  . APPENDECTOMY    . endometrial ablation spring  2008  . GASTRIC BYPASS  2013  . HYSTEROSCOPY WITH NOVASURE  08/12/2012   Procedure: HYSTEROSCOPY WITH NOVASURE;  Surgeon: Marylynn Pearson, MD;  Location: [facility];  Service: Gynecology;  Laterality: N/A;  . LAPAROSCOPIC TUBAL LIGATION  08/12/2012   Procedure: LAPAROSCOPIC TUBAL LIGATION;  Surgeon: Marylynn Pearson, MD;  Location: [facility];  Service: Gynecology;  Laterality: Bilateral;  WITH FILSHIE CLIPS  . right knee lateral release surgery  2011  . TONSILLECTOMY    . tumor removed  2012   removed right ankle    OB History    No data available       Home Medications    Prior to Admission medications   Medication Sig Start Date End Date Taking? Authorizing Provider  ALPRAZolam Duanne Moron) 0.5 MG tablet Take 0.5 mg by  mouth at bedtime as needed for anxiety.    Historical Provider, MD  amoxicillin-clavulanate (AUGMENTIN) 875-125 MG tablet Take 1 tablet by mouth 2 (two) times daily. Take with food 06/07/16   Kandra Nicolas, MD  doxepin [facility]) 25 MG capsule  04/03/16   Historical Provider, MD  DULoxetine (CYMBALTA) 30 MG capsule Take 1 capsule (30 mg total) by mouth daily. 06/01/16   Jade L Breeback, PA-C  HYDROcodone-acetaminophen (NORCO/VICODIN) 5-325 MG tablet Take 1 tablet by mouth every 6 (six) hours as needed for moderate pain. 06/07/16   Kandra Nicolas, MD  Multiple Vitamins-Minerals (MULTIVITAMIN WITH MINERALS) tablet Take by mouth.    Historical Provider, MD    mupirocin ointment (BACTROBAN) 2 % Apply 1 application topically 3 (three) times daily. 06/07/16   Kandra Nicolas, MD  omeprazole (PRILOSEC) 20 MG capsule Take 1 capsule by mouth  twice daily 09/28/15   Historical Provider, MD  vortioxetine HBr (TRINTELLIX) 5 MG TABS Take one tablet for 10 days then increase to 2 tablets daily. 06/01/16   Donella Stade, PA-C    Family History Family History  Problem Relation Age of Onset  . Cancer Father     metastatic, colon ca  . Hypertension Mother   . Diabetes Mother   . Breast cancer Mother     Social History Social History  Substance Use Topics  . Smoking status: Never Smoker  . Smokeless tobacco: Never Used  . Alcohol use No     Allergies   Morphine and related and Prednisone   Review of Systems Review of Systems  Musculoskeletal: Positive for stiffness.  All other systems reviewed and are negative.    Physical Exam Triage Vital Signs ED Triage Vitals  Enc Vitals Group     BP 06/07/16 1846 127/85     Pulse Rate 06/07/16 1846 79     Resp 06/07/16 1846 16     Temp 06/07/16 1846 97.7 F (36.5 C)     Temp Source 06/07/16 1846 Oral     SpO2 06/07/16 1846 100 %     Weight 06/07/16 1847 177 lb (80.3 kg)     Height 06/07/16 1847 5' 6.5" (1.689 m)     Head Circumference --      Peak Flow --      Pain Score 06/07/16 1848 4     Pain Loc --      Pain Edu? --      Excl. in [place]? --    No data found.   Updated Vital Signs BP 127/85 (BP Location: Left Arm)   Pulse 79   Temp 97.7 F (36.5 C) (Oral)   Resp 16   Ht 5' 6.5" (1.689 m)   Wt 177 lb (80.3 kg)   SpO2 100%   BMI 28.14 kg/m   Visual Acuity Right Eye Distance:   Left Eye Distance:   Bilateral Distance:    Right Eye Near:   Left Eye Near:    Bilateral Near:     Physical Exam  Constitutional: She appears well-developed and well-nourished. No distress.  HENT:  Head: Atraumatic.  Eyes: Pupils are equal, round, and reactive to light.  Cardiovascular: Normal  rate.   Pulmonary/Chest: Effort normal.  Musculoskeletal:       Left hand: She exhibits decreased range of motion and tenderness. She exhibits no bony tenderness, normal two-point discrimination, normal capillary refill, no deformity, no laceration and no swelling. Normal sensation noted.       Hands:  Left hand reveals multiple small 1 - 33mm puncture wounds dorsum over first metacarpal, and two 2 to 69mm puncture wounds over thenar eminence.  Thumb has full range of motion.  No swelling.  Distal neurovascular function is intact.   Neurological: She is alert.  Skin: Skin is warm and dry.  Nursing note and vitals reviewed.    UC Treatments / Results  Labs (all labs ordered are listed, but only abnormal results are displayed) Labs Reviewed - No data to display  EKG  EKG Interpretation None       Radiology No results found.  Procedures Procedures Wounds left hand cleaned with HibiClens and saline.  Press photographer.  Medications Ordered in UC Medications - No data to display   Initial Impression / Assessment and Plan / UC Course  I have reviewed the triage vital signs and the nursing notes.  Pertinent labs & imaging results that were available during my care of the patient were reviewed by me and considered in my medical decision making (see chart for details).  Clinical Course  Begin Augmentin 875mg  Q12 hours for coverage of pasteurella multocida.  Rx for topical Bactroban ointment. Rx for Lortab for pain. Change dressing daily and apply Bactroban ointment to wound.  Keep wound clean and dry.  Return for any signs of infection (or follow-up with family doctor):  Increasing redness, swelling, pain, heat, drainage, etc.  Elevate hand.  Apply ice pack for 10 to 15 minutes every 2 to 3 hours until swelling decreases. Followup with [facility] in 4 days.     Final Clinical Impressions(s) / UC Diagnoses   Final diagnoses:  Cat bite of left hand, initial  encounter    New Prescriptions New Prescriptions   AMOXICILLIN-CLAVULANATE (AUGMENTIN) 875-125 MG TABLET    Take 1 tablet by mouth 2 (two) times daily. Take with food   HYDROCODONE-ACETAMINOPHEN (NORCO/VICODIN) 5-325 MG TABLET    Take 1 tablet by mouth every 6 (six) hours as needed for moderate pain.   MUPIROCIN OINTMENT (BACTROBAN) 2 %    Apply 1 application topically 3 (three) times daily.     Kandra Nicolas, MD 06/07/16 2150

## 2016-06-08 ENCOUNTER — Telehealth: Payer: Self-pay | Admitting: *Deleted

## 2016-06-08 NOTE — Telephone Encounter (Signed)
Per Dr. Aurelio Brash request, made f/u apt with [facility] for 06/11/2016. @ 11am. Apt time,  Location and contact info left on patient voicemail.

## 2016-06-11 NOTE — Telephone Encounter (Signed)
Response back from insurance compay is PA is not required because this medication is on the plan's list of covered drugs. Faxed notification to the pharmacy.

## 2016-06-29 ENCOUNTER — Ambulatory Visit (INDEPENDENT_AMBULATORY_CARE_PROVIDER_SITE_OTHER): Payer: 59 | Admitting: Physician Assistant

## 2016-06-29 ENCOUNTER — Encounter: Payer: Self-pay | Admitting: Physician Assistant

## 2016-06-29 VITALS — BP 125/75 | HR 82 | Wt 187.0 lb

## 2016-06-29 DIAGNOSIS — F329 Major depressive disorder, single episode, unspecified: Secondary | ICD-10-CM

## 2016-06-29 DIAGNOSIS — F32A Depression, unspecified: Secondary | ICD-10-CM

## 2016-06-29 DIAGNOSIS — F418 Other specified anxiety disorders: Secondary | ICD-10-CM | POA: Diagnosis not present

## 2016-06-29 DIAGNOSIS — F419 Anxiety disorder, unspecified: Principal | ICD-10-CM

## 2016-06-29 MED ORDER — VORTIOXETINE HBR 10 MG PO TABS: ORAL_TABLET | ORAL | 5 refills | Status: DC

## 2016-06-29 NOTE — Progress Notes (Signed)
   Subjective:    Patient ID: Denise Craig, female    DOB: 12-13-1970, 45 y.o.   MRN: [id number]  HPI Pt is a 45 yo female who presents to the clinic for 2 month follow up on transition of cymbalta to trintellix. She is doing great. She denies any side effects. She still has days of agitation but overall feels controlled. She continues to have some struggles with energy. She has not started talking to a counselor at this time.    Review of Systems  All other systems reviewed and are negative.      Objective:   Physical Exam  Constitutional: She is oriented to person, place, and time. She appears well-developed and well-nourished.  HENT:  Head: Normocephalic and atraumatic.  Cardiovascular: Normal rate, regular rhythm and normal heart sounds.   Pulmonary/Chest: Effort normal and breath sounds normal.  Neurological: She is alert and oriented to person, place, and time.  Psychiatric: She has a normal mood and affect. Her behavior is normal.          Assessment & Plan:  Marland KitchenMarland KitchenLawrie was seen today for f/u mood.  Diagnoses and all orders for this visit:  Anxiety and depression -     vortioxetine HBr (TRINTELLIX) 10 MG TABS; Take one tablet by mouth daily.   PHQ-9 was 6. GAD-7 was 4.  Refilled medications for 6 months.  Encouraged patient to make appt for counseling. I think this would benefit her.  Follow up as needed or if symptoms worsening.

## 2016-07-31 ENCOUNTER — Encounter: Payer: Self-pay | Admitting: Physician Assistant

## 2016-07-31 ENCOUNTER — Ambulatory Visit (INDEPENDENT_AMBULATORY_CARE_PROVIDER_SITE_OTHER): Payer: 59 | Admitting: Physician Assistant

## 2016-07-31 VITALS — BP 137/86 | HR 92 | Temp 98.5°F | Ht 66.5 in | Wt 180.0 lb

## 2016-07-31 DIAGNOSIS — J029 Acute pharyngitis, unspecified: Secondary | ICD-10-CM

## 2016-07-31 DIAGNOSIS — G43A0 Cyclical vomiting, not intractable: Secondary | ICD-10-CM | POA: Diagnosis not present

## 2016-07-31 DIAGNOSIS — R1115 Cyclical vomiting syndrome unrelated to migraine: Secondary | ICD-10-CM

## 2016-07-31 LAB — POCT RAPID STREP A (OFFICE): RAPID STREP A SCREEN: NEGATIVE

## 2016-07-31 MED ORDER — ONDANSETRON 8 MG PO TBDP: 8.0000 mg | ORAL_TABLET | Freq: Three times a day (TID) | ORAL | 0 refills | Status: DC | PRN

## 2016-07-31 NOTE — Progress Notes (Addendum)
   Subjective:    Patient ID: Denise Craig, female    DOB: 1971-06-12, 45 y.o.   MRN: [id number]  HPI Patient is a 45 yo female coming to the clinic complaining of a sore throat that started last night. Patient reports that she had the common cold for a few weeks while she was in [place] at the beginning of November. She states that she began to feel better for 4 days until the sore throat started last night. Patient also reports that she has a headache and feels fatigued. Patient currently denies any cough, fever or rhinorrhea.   Patient called the office after she got home from her appointment and is now complaining of nausea and vomiting.   Review of Systems See HPI    Objective:   Physical Exam  Constitutional: No distress.  HENT:  Posterior pharynx is erythematous with no exudate. Turbinates erythematous bilaterally.   Cardiovascular: Normal rate and regular rhythm.  Exam reveals no gallop and no friction rub.   No murmur heard. Pulmonary/Chest: Effort normal and breath sounds normal. No respiratory distress. She has no wheezes. She has no rales. She exhibits no tenderness.          Assessment & Plan:  Diagnoses and all orders for this visit:  Sore throat -     POCT rapid strep A- resulted negative  -Advised patient to take ibuprofen two 200 mg tablets and alternate with tylenol two 500 mg tablets for sore throat. Also told patient to use cough drops to help soothe throat.  -HO given.  -follow up as needed -written out of work for next 2 days.   Non-intractable cyclical vomiting with nausea -   Start ondansetron (ZOFRAN-ODT) 8 MG disintegrating tablet; Take 1 tablet every 8 hours for nausea. Advised patient to call the office in the morning if symptoms worsen.

## 2016-07-31 NOTE — Patient Instructions (Signed)

## 2016-08-22 ENCOUNTER — Other Ambulatory Visit: Payer: Self-pay | Admitting: Physician Assistant

## 2016-08-22 ENCOUNTER — Encounter: Payer: Self-pay | Admitting: Physician Assistant

## 2016-08-22 DIAGNOSIS — F32A Depression, unspecified: Secondary | ICD-10-CM

## 2016-08-22 DIAGNOSIS — F419 Anxiety disorder, unspecified: Principal | ICD-10-CM

## 2016-08-22 DIAGNOSIS — F329 Major depressive disorder, single episode, unspecified: Secondary | ICD-10-CM

## 2016-08-22 MED ORDER — ALPRAZOLAM 0.5 MG PO TABS: 0.5000 mg | ORAL_TABLET | Freq: Every evening | ORAL | 0 refills | Status: DC | PRN

## 2016-09-21 ENCOUNTER — Encounter: Payer: Self-pay | Admitting: Physician Assistant

## 2016-09-21 ENCOUNTER — Ambulatory Visit (INDEPENDENT_AMBULATORY_CARE_PROVIDER_SITE_OTHER): Payer: 59 | Admitting: Physician Assistant

## 2016-09-21 VITALS — BP 133/82 | HR 82 | Ht 66.5 in | Wt 184.0 lb

## 2016-09-21 DIAGNOSIS — Z9884 Bariatric surgery status: Secondary | ICD-10-CM | POA: Diagnosis not present

## 2016-09-21 DIAGNOSIS — D509 Iron deficiency anemia, unspecified: Secondary | ICD-10-CM | POA: Diagnosis not present

## 2016-09-21 DIAGNOSIS — Z9189 Other specified personal risk factors, not elsewhere classified: Secondary | ICD-10-CM | POA: Insufficient documentation

## 2016-09-21 DIAGNOSIS — D518 Other vitamin B12 deficiency anemias: Secondary | ICD-10-CM

## 2016-09-21 DIAGNOSIS — F418 Other specified anxiety disorders: Secondary | ICD-10-CM

## 2016-09-21 DIAGNOSIS — F329 Major depressive disorder, single episode, unspecified: Secondary | ICD-10-CM

## 2016-09-21 DIAGNOSIS — F32A Depression, unspecified: Secondary | ICD-10-CM

## 2016-09-21 DIAGNOSIS — Z803 Family history of malignant neoplasm of breast: Secondary | ICD-10-CM | POA: Insufficient documentation

## 2016-09-21 DIAGNOSIS — R232 Flushing: Secondary | ICD-10-CM

## 2016-09-21 DIAGNOSIS — F419 Anxiety disorder, unspecified: Secondary | ICD-10-CM

## 2016-09-21 NOTE — Addendum Note (Signed)
Addended by: Donella Stade on: 09/21/2016 05:02 PM   Modules accepted: Orders

## 2016-09-21 NOTE — Progress Notes (Deleted)
   Subjective:    Patient ID: Denise Craig, female    DOB: 10-Feb-1971, 46 y.o.   MRN: [id number]  HPI  Mother-40's started mammograms at [address] and dad Several aunts father and mother Jew hertileft.  Implants. GI appt middle feb.     Review of Systems     Objective:   Physical Exam        Assessment & Plan:

## 2016-09-21 NOTE — Progress Notes (Addendum)
Subjective:    Patient ID: Denise Craig, female    DOB: 1971/07/16, 46 y.o.   MRN: [id number]  HPI  Patient is a 46yo female who presents to the clinic for lab work.  Patient reports she is having increased fatigue and hot flashes.  Patient would like a CBC, CMP, T4, FSH, and estrogen.    Patient reports her anxiety and irritability have not improved.  She feels that she becomes increasingly irritable in situations.  Patient denies any recent panic attacks.    Patient reports she has had several abdominal surgeries.  Since she had an appendectomy 2 years ago she states she has episodic abdominal pain, nausea, and alternating diarrhea and constipation.  Patient is worried about malabsorption issues.  Patient states that last week she noticed after a bowel movement she saw a whole pill in the toilet (this occurred twice last week after taking doxycycline).  Patient denies in blood in her stools.  She reports she has loose stools a few times a week.  She takes a stool softener daily. Patient reports she has a GI appointment in mid-February.   .. Family History  Problem Relation Age of Onset  . Cancer Father     metastatic, colon ca  . Hypertension Mother   . Diabetes Mother   . Breast cancer Mother      Review of Systems  Constitutional: Positive for fatigue.  All other systems reviewed and are negative.      Objective:   Physical Exam  Constitutional: She is oriented to person, place, and time. She appears well-developed and well-nourished.  HENT:  Head: Normocephalic and atraumatic.  Cardiovascular: Normal rate, regular rhythm and normal heart sounds.   Pulmonary/Chest: Effort normal and breath sounds normal.  Abdominal: Soft. Bowel sounds are normal. There is no tenderness.  Neurological: She is alert and oriented to person, place, and time.  Skin: Skin is warm and dry.  Psychiatric: She has a normal mood and affect. Her behavior is normal.  Nursing note and vitals  reviewed.         Assessment & Plan:  Marland KitchenMarland KitchenDiagnoses and all orders for this visit:  S/P laparoscopic sleeve gastrectomy -     COMPLETE METABOLIC PANEL WITH GFR -     B12 -     VITAMIN D 25 Hydroxy (Vit-D Deficiency, Fractures)  ANEMIA, VITAMIN B12 DEFICIENCY -     COMPLETE METABOLIC PANEL WITH GFR -     B12  Iron deficiency anemia, unspecified iron deficiency anemia type -     CBC with Differential/Platelet -     COMPLETE METABOLIC PANEL WITH GFR  Anxiety and depression -     COMPLETE METABOLIC PANEL WITH GFR  Hot flashes -     Follicle stimulating hormone -     TSH -     T4, free -     COMPLETE METABOLIC PANEL WITH GFR -     Estrogens, total  Family history of breast cancer  At risk for impaired digestion -     B12 -     VITAMIN D 25 Hydroxy (Vit-D Deficiency, Fractures)    Encouraged patient to continue to take her medications with food to see if it helps with digestion of pills. Patient is following up with GI mid-February.   Patient referral made to see geneticist due to sigificant family history of breast cancer.  Patient is interested in getting tested for the BRCA gene.    Patient's routine labs will  be checked today (CBC and CMP).  Since patient is worried about malabsorption B12 and vitamin D will be checked.  A FSH will be checked due to severe hot flashes.  Patient reports she will be seeing her OBGYN in February.    Patient's Trintellix dose increased to 31m daily for anxiety and irritability.   Follow-up as needed in the next 2 months to follow up on increase medications.

## 2016-09-21 NOTE — Patient Instructions (Signed)
Increase trintelix to 20mg  daily.

## 2016-09-22 LAB — VITAMIN D 25 HYDROXY (VIT D DEFICIENCY, FRACTURES): VIT D 25 HYDROXY: 16 ng/mL — AB (ref 30–100)

## 2016-09-22 LAB — COMPLETE METABOLIC PANEL WITH GFR
ALT: 29 U/L (ref 6–29)
AST: 27 U/L (ref 10–35)
Albumin: 3.7 g/dL (ref 3.6–5.1)
Alkaline Phosphatase: 53 U/L (ref 33–115)
BILIRUBIN TOTAL: 0.3 mg/dL (ref 0.2–1.2)
BUN: 12 mg/dL (ref 7–25)
CALCIUM: 8.7 mg/dL (ref 8.6–10.2)
CO2: 25 mmol/L (ref 20–31)
CREATININE: 0.67 mg/dL (ref 0.50–1.10)
Chloride: 105 mmol/L (ref 98–110)
Glucose, Bld: 76 mg/dL (ref 65–99)
Potassium: 4.3 mmol/L (ref 3.5–5.3)
Sodium: 140 mmol/L (ref 135–146)
TOTAL PROTEIN: 6.1 g/dL (ref 6.1–8.1)

## 2016-09-22 LAB — CBC WITH DIFFERENTIAL/PLATELET
BASOS ABS: 54 {cells}/uL (ref 0–200)
BASOS PCT: 1 %
EOS ABS: 270 {cells}/uL (ref 15–500)
Eosinophils Relative: 5 %
HEMATOCRIT: 41.3 % (ref 35.0–45.0)
Hemoglobin: 13.4 g/dL (ref 11.7–15.5)
Lymphocytes Relative: 24 %
Lymphs Abs: 1296 cells/uL (ref 850–3900)
MCH: 29.7 pg (ref 27.0–33.0)
MCHC: 32.4 g/dL (ref 32.0–36.0)
MCV: 91.6 fL (ref 80.0–100.0)
MONO ABS: 486 {cells}/uL (ref 200–950)
MONOS PCT: 9 %
MPV: 11.3 fL (ref 7.5–12.5)
NEUTROS ABS: 3294 {cells}/uL (ref 1500–7800)
Neutrophils Relative %: 61 %
PLATELETS: 169 10*3/uL (ref 140–400)
RBC: 4.51 MIL/uL (ref 3.80–5.10)
RDW: 14.5 % (ref 11.0–15.0)
WBC: 5.4 10*3/uL (ref 3.8–10.8)

## 2016-09-22 LAB — FOLLICLE STIMULATING HORMONE: FSH: 50.1 m[IU]/mL

## 2016-09-22 LAB — TSH: TSH: 1.3 m[IU]/L

## 2016-09-22 LAB — VITAMIN B12: VITAMIN B 12: 580 pg/mL (ref 200–1100)

## 2016-09-22 LAB — T4, FREE: Free T4: 0.9 ng/dL (ref 0.8–1.8)

## 2016-09-24 ENCOUNTER — Encounter: Payer: Self-pay | Admitting: Physician Assistant

## 2016-09-24 DIAGNOSIS — R790 Abnormal level of blood mineral: Secondary | ICD-10-CM | POA: Insufficient documentation

## 2016-09-24 DIAGNOSIS — E559 Vitamin D deficiency, unspecified: Secondary | ICD-10-CM | POA: Insufficient documentation

## 2016-09-24 DIAGNOSIS — Z780 Asymptomatic menopausal state: Secondary | ICD-10-CM | POA: Insufficient documentation

## 2016-09-24 NOTE — Progress Notes (Signed)
Call pt: Denise Craig is above 23 you ARE in menopause.  Vitamin D very low start 2000 units D3 daily for 2 months then recheck.  B12 great.  Thyroid great.  Cholesterol looks good.  Iron stores are low. Start ferrous sulfate 325mg  OTC daily with meals.

## 2016-09-25 LAB — ESTROGENS, TOTAL: Estrogen: 270.7 pg/mL

## 2016-09-26 NOTE — Progress Notes (Signed)
Although [place] showing menopause. Estrogen levels are still rather high meaning likely just going into menopause.

## 2016-09-26 NOTE — Progress Notes (Signed)
Send fusion plus iron 1 tablet daily. 11 refills. He helps with GI discomfort.

## 2016-09-27 ENCOUNTER — Other Ambulatory Visit: Payer: Self-pay

## 2016-09-27 MED ORDER — FUSION PLUS PO CAPS: ORAL_CAPSULE | ORAL | 11 refills | Status: DC

## 2016-10-03 ENCOUNTER — Encounter: Payer: Self-pay | Admitting: Physician Assistant

## 2016-10-03 ENCOUNTER — Other Ambulatory Visit: Payer: Self-pay | Admitting: Physician Assistant

## 2016-10-03 MED ORDER — VORTIOXETINE HBR 20 MG PO TABS: 20.0000 mg | ORAL_TABLET | Freq: Every day | ORAL | 0 refills | Status: DC

## 2016-12-28 ENCOUNTER — Encounter: Payer: Self-pay | Admitting: Physician Assistant

## 2016-12-28 ENCOUNTER — Ambulatory Visit (INDEPENDENT_AMBULATORY_CARE_PROVIDER_SITE_OTHER): Payer: 59 | Admitting: Physician Assistant

## 2016-12-28 DIAGNOSIS — F329 Major depressive disorder, single episode, unspecified: Secondary | ICD-10-CM | POA: Diagnosis not present

## 2016-12-28 DIAGNOSIS — F419 Anxiety disorder, unspecified: Secondary | ICD-10-CM | POA: Diagnosis not present

## 2016-12-28 DIAGNOSIS — F32A Depression, unspecified: Secondary | ICD-10-CM

## 2016-12-28 MED ORDER — VORTIOXETINE HBR 20 MG PO TABS: 20.0000 mg | ORAL_TABLET | Freq: Every day | ORAL | 3 refills | Status: DC

## 2016-12-28 MED ORDER — ALPRAZOLAM 0.5 MG PO TABS: 0.5000 mg | ORAL_TABLET | Freq: Every evening | ORAL | 1 refills | Status: DC | PRN

## 2016-12-28 NOTE — Progress Notes (Signed)
   Subjective:    Patient ID: Denise Craig, female    DOB: 1971/04/13, 46 y.o.   MRN: [id number]  HPI Pt is a 46 yo female who presents to the clinic for 6 month follow up on anxiety and depression.  She is on daily trintellix and as needed xanax. She has only had 30 xanax in the last 5 months. She only takes as needed. 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 This has been hard on her. She feels like that is why in the last 2 weeks her anxiety and depression have been worse.   Review of Systems  All other systems reviewed and are negative.      Objective:   Physical Exam  Constitutional: She is oriented to person, place, and time. She appears well-developed and well-nourished.  HENT:  Head: Normocephalic and atraumatic.  Cardiovascular: Normal rate, regular rhythm and normal heart sounds.   Pulmonary/Chest: Effort normal and breath sounds normal.  Neurological: She is alert and oriented to person, place, and time.  Psychiatric: She has a normal mood and affect. Her behavior is normal.          Assessment & Plan:  Marland KitchenMarland KitchenStephine was seen today for depression and anxiety.  Diagnoses and all orders for this visit:  Anxiety and depression -     vortioxetine HBr (TRINTELLIX) 20 MG TABS; Take 20 mg by mouth daily. -     ALPRAZolam (XANAX) 0.5 MG tablet; Take 1 tablet (0.5 mg total) by mouth at bedtime as needed for anxiety.   .. Depression screen [facility] 2/9 12/28/2016 06/29/2016  Decreased Interest 0 1  Down, Depressed, Hopeless 1 1  PHQ - 2 Score 1 2  Altered sleeping 2 1  Tired, decreased energy 2 1  Change in appetite 2 1  Feeling bad or failure about yourself  1 0  Trouble concentrating 1 1  Moving slowly or fidgety/restless 0 0  Suicidal thoughts 0 0  PHQ-9 Score 9 6   .Marland Kitchen GAD 7 : Generalized Anxiety Score 12/28/2016 06/29/2016  Nervous, Anxious, on Edge 1 1  Control/stop worrying 1 1  Worry too much -  different things 1 1  Trouble relaxing 2 0  Restless 1 0  Easily annoyed or irritable 2 1  Afraid - awful might happen 0 0  Total GAD 7 Score 8 4  Anxiety Difficulty Somewhat difficult Somewhat difficult    Consider grief counseling with hospice.  Stay on same dose of trintellix. Refilled xanax as needed.  Follow up in 6 months.

## 2017-01-04 ENCOUNTER — Ambulatory Visit (INDEPENDENT_AMBULATORY_CARE_PROVIDER_SITE_OTHER): Payer: 59 | Admitting: Family Medicine

## 2017-01-04 ENCOUNTER — Encounter: Payer: Self-pay | Admitting: Family Medicine

## 2017-01-04 VITALS — BP 128/79 | HR 68 | Wt 193.0 lb

## 2017-01-04 DIAGNOSIS — M5416 Radiculopathy, lumbar region: Secondary | ICD-10-CM

## 2017-01-04 MED ORDER — HYDROCODONE-ACETAMINOPHEN 5-325 MG PO TABS: 1.0000 | ORAL_TABLET | Freq: Four times a day (QID) | ORAL | 0 refills | Status: DC | PRN

## 2017-01-04 MED ORDER — METHYLPREDNISOLONE 4 MG PO TBPK: ORAL_TABLET | ORAL | 0 refills | Status: DC

## 2017-01-04 MED ORDER — GABAPENTIN 300 MG PO CAPS: ORAL_CAPSULE | ORAL | 3 refills | Status: DC

## 2017-01-04 MED ORDER — METHOCARBAMOL 500 MG PO TABS: 500.0000 mg | ORAL_TABLET | Freq: Three times a day (TID) | ORAL | 0 refills | Status: DC

## 2017-01-04 NOTE — Patient Instructions (Signed)
Thank you for coming in today. I recommend a second opinion.  Recheck as needed.  Come back or go to the emergency room if you notice new weakness new numbness problems walking or bowel or bladder problems.   Sciatica Sciatica is pain, numbness, weakness, or tingling along the path of the sciatic nerve. The sciatic nerve starts in the lower back and runs down the back of each leg. The nerve controls the muscles in the lower leg and in the back of the knee. It also provides feeling (sensation) to the back of the thigh, the lower leg, and the sole of the foot. Sciatica is a symptom of another medical condition that pinches or puts pressure on the sciatic nerve. Generally, sciatica only affects one side of the body. Sciatica usually goes away on its own or with treatment. In some cases, sciatica may keep coming back (recur). What are the causes? This condition is caused by pressure on the sciatic nerve, or pinching of the sciatic nerve. This may be the result of:  A disk in between the bones of the spine (vertebrae) bulging out too far (herniated disk).  Age-related changes in the spinal disks (degenerative disk disease).  A pain disorder that affects a muscle in the buttock (piriformis syndrome).  Extra bone growth (bone spur) near the sciatic nerve.  An injury or break (fracture) of the pelvis.  Pregnancy.  Tumor (rare). What increases the risk? The following factors may make you more likely to develop this condition:  Playing sports that place pressure or stress on the spine, such as football or weight lifting.  Having poor strength and flexibility.  A history of back injury.  A history of back surgery.  Sitting for long periods of time.  Doing activities that involve repetitive bending or lifting.  Obesity. What are the signs or symptoms? Symptoms can vary from mild to very severe, and they may include:  Any of these problems in the lower back, leg, hip, or  buttock:  Mild tingling or dull aches.  Burning sensations.  Sharp pains.  Numbness in the back of the calf or the sole of the foot.  Leg weakness.  Severe back pain that makes movement difficult. These symptoms may get worse when you cough, sneeze, or laugh, or when you sit or stand for long periods of time. Being overweight may also make symptoms worse. In some cases, symptoms may recur over time. How is this diagnosed? This condition may be diagnosed based on:  Your symptoms.  A physical exam. Your health care provider may ask you to do certain movements to check whether those movements trigger your symptoms.  You may have tests, including:  Blood tests.  X-rays.  MRI.  CT scan. How is this treated? In many cases, this condition improves on its own, without any treatment. However, treatment may include:  Reducing or modifying physical activity during periods of pain.  Exercising and stretching to strengthen your abdomen and improve the flexibility of your spine.  Icing and applying heat to the affected area.  Medicines that help:  To relieve pain and swelling.  To relax your muscles.  Injections of medicines that help to relieve pain, irritation, and inflammation around the sciatic nerve (steroids).  Surgery. Follow these instructions at home: Medicines   Take over-the-counter and prescription medicines only as told by your health care provider.  Do not drive or operate heavy machinery while taking prescription pain medicine. Managing pain   If directed, apply ice  to the affected area.  Put ice in a plastic bag.  Place a towel between your skin and the bag.  Leave the ice on for 20 minutes, 2-3 times a day.  After icing, apply heat to the affected area before you exercise or as often as told by your health care provider. Use the heat source that your health care provider recommends, such as a moist heat pack or a heating pad.  Place a towel between  your skin and the heat source.  Leave the heat on for 20-30 minutes.  Remove the heat if your skin turns bright red. This is especially important if you are unable to feel pain, heat, or cold. You may have a greater risk of getting burned. Activity   Return to your normal activities as told by your health care provider. Ask your health care provider what activities are safe for you.  Avoid activities that make your symptoms worse.  Take brief periods of rest throughout the day. Resting in a lying or standing position is usually better than sitting to rest.  When you rest for longer periods, mix in some mild activity or stretching between periods of rest. This will help to prevent stiffness and pain.  Avoid sitting for long periods of time without moving. Get up and move around at least one time each hour.  Exercise and stretch regularly, as told by your health care provider.  Do not lift anything that is heavier than 10 lb (4.5 kg) while you have symptoms of sciatica. When you do not have symptoms, you should still avoid heavy lifting, especially repetitive heavy lifting.  When you lift objects, always use proper lifting technique, which includes:  Bending your knees.  Keeping the load close to your body.  Avoiding twisting. General instructions   Use good posture.  Avoid leaning forward while sitting.  Avoid hunching over while standing.  Maintain a healthy weight. Excess weight puts extra stress on your back and makes it difficult to maintain good posture.  Wear supportive, comfortable shoes. Avoid wearing high heels.  Avoid sleeping on a mattress that is too soft or too hard. A mattress that is firm enough to support your back when you sleep may help to reduce your pain.  Keep all follow-up visits as told by your health care provider. This is important. Contact a health care provider if:  You have pain that wakes you up when you are sleeping.  You have pain that gets  worse when you lie down.  Your pain is worse than you have experienced in the past.  Your pain lasts longer than 4 weeks.  You experience unexplained weight loss. Get help right away if:  You lose control of your bowel or bladder (incontinence).  You have:  Weakness in your lower back, pelvis, buttocks, or legs that gets worse.  Redness or swelling of your back.  A burning sensation when you urinate. This information is not intended to replace advice given to you by your health care provider. Make sure you discuss any questions you have with your health care provider. Document Released: 08/14/2001 Document Revised: 01/24/2016 Document Reviewed: 04/29/2015 Elsevier Interactive Patient Education  2017 [employer].

## 2017-01-04 NOTE — Progress Notes (Signed)
   Subjective:    I'm seeing this patient as a consultation for:  Iran Planas, PA-C   CC: Back pain radiating to the right leg.  HPI: Patient has a history of back pain with intermittent radiculopathy previously. She's been evaluated in orthopedic office with MRIs showing which she reports is disc space compression and bulging disc at either L4 or at L5. She's had what sounds like 1 history of an epidural injection. She was doing pretty well until about 4 days ago when she developed pain in her right low back radiating to the right anterior thigh and medial shin. She notes pain is worse with a flexed position. She denies any weakness or bowel bladder dysfunction. With the pain she notes numbness and tingling.  Past medical history, Surgical history, Family history not pertinant except as noted below, Social history, Allergies, and medications have been entered into the medical record, reviewed, and no changes needed.   Review of Systems: No headache, visual changes, nausea, vomiting, diarrhea, constipation, dizziness, abdominal pain, skin rash, fevers, chills, night sweats, weight loss, swollen lymph nodes, body aches, joint swelling, muscle aches, chest pain, shortness of breath, mood changes, visual or auditory hallucinations.   Objective:    Vitals:   01/04/17 0957  BP: 128/79  Pulse: 68   General: Well Developed, well nourished, and in no acute distress.  Neuro/Psych: Alert and oriented x3, extra-ocular muscles intact, able to move all 4 extremities, sensation grossly intact. Skin: Warm and dry, no rashes noted.  Respiratory: Not using accessory muscles, speaking in full sentences, trachea midline.  Cardiovascular: Pulses palpable, no extremity edema. Abdomen: Does not appear distended. MSK:  L spine nontender to midline decreased motion due to pain. Resident of right-sided slump test. Laboratory strength is equal and normal throughout. Sensation is intact throughout.  No results  found for this or any previous visit (from the past 24 hour(s)). No results found.  Impression and Recommendations:    Assessment and Plan: 46 y.o. female with Lumbar spasm with lumbar radiculopathy. Discussed options. Plan for trial of Norco Medrol Dosepak and gabapentin and Robaxin. Recheck in a few weeks if not improved.  Patient was researched in the [place] controlled substance reporting system.   Discussed warning signs or symptoms. Please see discharge instructions. Patient expresses understanding.

## 2017-06-14 ENCOUNTER — Ambulatory Visit: Payer: Self-pay | Admitting: Family Medicine

## 2017-07-05 ENCOUNTER — Ambulatory Visit (INDEPENDENT_AMBULATORY_CARE_PROVIDER_SITE_OTHER): Payer: 59 | Admitting: Physician Assistant

## 2017-07-05 ENCOUNTER — Encounter: Payer: Self-pay | Admitting: Physician Assistant

## 2017-07-05 VITALS — BP 135/68 | HR 67 | Temp 97.6°F | Ht 66.5 in | Wt 199.0 lb

## 2017-07-05 DIAGNOSIS — J069 Acute upper respiratory infection, unspecified: Secondary | ICD-10-CM

## 2017-07-05 DIAGNOSIS — J040 Acute laryngitis: Secondary | ICD-10-CM | POA: Diagnosis not present

## 2017-07-05 MED ORDER — METHYLPREDNISOLONE SODIUM SUCC 125 MG IJ SOLR
125.0000 mg | Freq: Once | INTRAMUSCULAR | Status: AC
  Administered 2017-07-05: 125 mg via INTRAMUSCULAR

## 2017-07-05 MED ORDER — BENZONATATE 200 MG PO CAPS: 200.0000 mg | ORAL_CAPSULE | Freq: Two times a day (BID) | ORAL | 0 refills | Status: DC | PRN

## 2017-07-05 NOTE — Progress Notes (Signed)
Subjective:    Patient ID: Denise Craig, female    DOB: 02-03-1971, 46 y.o.   MRN: [id number]  HPI  Patient is a 46 year old female who presents to the clinic with nasal congestion that started yesterday and woke up this morning with dry cough and no voice.  She denies any fever, chills, shortness of breath, chest tightness, sinus pressure, ear pain, sore throat.  She did take an allergy pill yesterday and seemed to help with some of her symptoms.  She has not taken anything this morning.  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  She had to leave her job this morning.  .. Active Ambulatory Problems    Diagnosis Date Noted  . THYROMEGALY 12/11/2007  . OBESITY, NOS 06/11/2006  . Iron deficiency anemia 05/16/2007  . ANEMIA, VITAMIN B12 DEFICIENCY 06/04/2007  . Anxiety state 06/11/2006  . MIGRAINE, UNSPEC., W/O INTRACTABLE MIGRAINE 06/11/2006  . ASTHMA, PERSISTENT, MODERATE 05/16/2007  . HIATAL HERNIA 12/22/2007  . IBS 06/16/2009  . ENDOMETRIOSIS 06/11/2006  . INSOMNIA 07/15/2008  . Essential hypertension, benign 02/27/2011  . GERD (gastroesophageal reflux disease) 09/17/2012  . Nephrolithiasis 07/14/2013  . Periumbilical abdominal pain 01/25/2016  . 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 Anxiety and depression 06/01/2016  . Hot flashes 09/21/2016  . Family history of breast cancer 09/21/2016  . At risk for impaired digestion 09/21/2016  . Menopause 09/24/2016  . Vitamin D deficiency 09/24/2016  . Low iron stores 09/24/2016   Resolved Ambulatory Problems    Diagnosis Date Noted  . Leg edema 01/02/2011   Past Medical History:  Diagnosis Date  . Anxiety   . Asthma   . B12 deficiency   . Depression   . GERD (gastroesophageal reflux disease)   . Infertility, female   . Iron deficiency   . Migraine   . Nephrolithiasis   . PONV (postoperative nausea and vomiting)   . 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 Uterine polyp        Review of Systems  All other systems reviewed and are negative.      Objective:   Physical Exam  Constitutional: She appears well-developed and well-nourished.  HENT:  Head: Normocephalic and atraumatic.  Right Ear: External ear normal.  Left Ear: External ear normal.  Mouth/Throat: No oropharyngeal exudate.  Tympanic membrane are clear bilaterally, good light reflex.  Negative for any sinus tenderness over maxillary or frontal sinuses.  Nasal turbinates are red and swollen bilaterally.  Oropharynx is erythematous with some clear postnasal drip.  Eyes: Conjunctivae are normal.  Neck: Normal range of motion. Neck supple.  Cardiovascular: Normal rate, regular rhythm and normal heart sounds.   Pulmonary/Chest: Effort normal and breath sounds normal. She has no wheezes.  Lymphadenopathy:    She has cervical adenopathy.  Psychiatric: She has a normal mood and affect. Her behavior is normal.          Assessment & Plan:  Marland KitchenMarland KitchenJazmyne was seen today for cough, sore throat and laryngitis.  Diagnoses and all orders for this visit:  Laryngitis -     methylPREDNISolone sodium succinate (SOLU-MEDROL) 125 mg/2 mL injection 125 mg; Inject 2 mLs (125 mg total) into the muscle once.  Viral upper respiratory tract infection -     benzonatate (TESSALON) 200 MG capsule; Take 1 capsule (200 mg total) by mouth 2 (two) times daily as needed for cough. -     methylPREDNISolone sodium succinate (SOLU-MEDROL) 125  mg/2 mL injection 125 mg; Inject 2 mLs (125 mg total) into the muscle once.   Discussed with patient likely viral etiology of symptoms.  Written out of work for today and possibly tomorrow if needed.  Patient does not tolerate oral prednisone very well.  She was given Solu-Medrol IM in office today.  She was given Ladona Ridgel for cough.  I would like for her to consider getting Tylenol Cold sinus severe over-the-counter for her upper respiratory symptoms.  Rest and hydrate.   Consider humidifier in room.  Follow up as needed or if symptoms not improving.

## 2017-07-05 NOTE — Patient Instructions (Signed)
Tylenol cold sinus severe for 3 days.  Hoarseness Hoarseness is any abnormal change in your voice.Hoarseness can make it difficult to speak. Your voice may sound raspy, breathy, or strained. Hoarseness is caused by a problem with the vocal cords. The vocal cords are two bands of tissue inside your voice box (larynx). When you speak, your vocal cords move back and forth to create sound. The surfaces of your vocal cords need to be smooth for your voice to sound clear. Swelling or lumps on the vocal cords can cause hoarseness. Common causes of vocal cord problems include:  Upper airway infection.  A long-term cough.  Straining or overusing your voice.  Smoking.  Allergies.  Vocal cord growths.  Stomach acids that flow up from your stomach and irritate your vocal cords (gastroesophageal reflux).  Follow these instructions at home: Watch your condition for any changes. To ease any discomfort that you feel:  Rest your voice. Do not whisper. Whispering can cause muscle strain.  Do not speak in a loud or harsh voice that makes your hoarseness worse.  Do not use any tobacco products, including cigarettes, chewing tobacco, or electronic cigarettes. If you need help quitting, ask your health care provider.  Avoid secondhand smoke.  Do not eat foods that give you heartburn. Heartburn can make gastroesophageal reflux worse.  Do not drink coffee.  Do not drink alcohol.  Drink enough fluids to keep your urine clear or pale yellow.  Use a humidifier if the air in your home is dry.  Contact a health care provider if:  You have hoarseness that lasts longer than 3 weeks.  You almost lose or completelylose your voice for longer than 3 days.  You have pain when you swallow or try to talk.  You feel a lump in your neck. Get help right away if:  You have trouble swallowing.  You feel as though you are choking when you swallow.  You cough up blood or vomit blood.  You have trouble  breathing. This information is not intended to replace advice given to you by your health care provider. Make sure you discuss any questions you have with your health care provider. Document Released: 08/03/2005 Document Revised: 01/26/2016 Document Reviewed: 08/11/2014 Elsevier Interactive Patient Education  [employer].

## 2017-07-07 ENCOUNTER — Encounter: Payer: Self-pay | Admitting: Physician Assistant

## 2017-07-10 ENCOUNTER — Other Ambulatory Visit: Payer: Self-pay | Admitting: Physician Assistant

## 2017-07-10 MED ORDER — AMOXICILLIN-POT CLAVULANATE 875-125 MG PO TABS: 1.0000 | ORAL_TABLET | Freq: Two times a day (BID) | ORAL | 0 refills | Status: DC

## 2017-07-15 ENCOUNTER — Telehealth: Payer: Self-pay | Admitting: *Deleted

## 2017-07-15 NOTE — Telephone Encounter (Signed)
LMOM for pt to call office to schedule appointment.

## 2017-07-15 NOTE — Telephone Encounter (Signed)
Trintellix only goes up to 20mg . Come in and lets talk about current situation. We may could increase xanax frequency for the short term. Strongly encouraged counseling through this hard time.

## 2017-07-15 NOTE — Telephone Encounter (Signed)
Pt left vm this morning stating that you told her to call if she felt like she needed her Trintellix increased.  She stated that a friend of hers recently passed away and feels like she needs to go up to 40mg .  If you are ok with the increase, she would like to know how long it would take for it to be in her system. Also, she wanted to know if there was anything that could to added in addition to the Trintellix and the Xanax that she takes at bedtime. I definitely feel like pt needs an appointment.  Please advise.

## 2017-07-22 ENCOUNTER — Telehealth: Payer: Self-pay

## 2017-07-22 MED ORDER — GABAPENTIN 300 MG PO CAPS: ORAL_CAPSULE | ORAL | 3 refills | Status: DC

## 2017-07-22 NOTE — Addendum Note (Signed)
Addended by: Gregor Hams on: 07/22/2017 03:43 PM   Modules accepted: Orders

## 2017-07-22 NOTE — Telephone Encounter (Signed)
Gabapentin 300 mg 90 days  Last refilled 01/04/2017 Last OV 01/04/2017 PCP [facility]  Please advise refill

## 2017-07-22 NOTE — Telephone Encounter (Signed)
Gabapentin refilled to [facility]

## 2017-07-23 NOTE — Telephone Encounter (Signed)
Left VM advising Pt.

## 2017-08-13 ENCOUNTER — Other Ambulatory Visit: Payer: Self-pay | Admitting: *Deleted

## 2017-08-13 DIAGNOSIS — F419 Anxiety disorder, unspecified: Principal | ICD-10-CM

## 2017-08-13 DIAGNOSIS — F32A Depression, unspecified: Secondary | ICD-10-CM

## 2017-08-13 DIAGNOSIS — F329 Major depressive disorder, single episode, unspecified: Secondary | ICD-10-CM

## 2017-08-13 MED ORDER — VORTIOXETINE HBR 20 MG PO TABS: 20.0000 mg | ORAL_TABLET | Freq: Every day | ORAL | 0 refills | Status: DC

## 2017-10-15 ENCOUNTER — Other Ambulatory Visit: Payer: Self-pay | Admitting: Physician Assistant

## 2017-10-15 DIAGNOSIS — F419 Anxiety disorder, unspecified: Principal | ICD-10-CM

## 2017-10-15 DIAGNOSIS — F329 Major depressive disorder, single episode, unspecified: Secondary | ICD-10-CM

## 2017-10-15 DIAGNOSIS — F32A Depression, unspecified: Secondary | ICD-10-CM

## 2018-01-21 ENCOUNTER — Encounter: Payer: Self-pay | Admitting: Physician Assistant

## 2018-01-21 ENCOUNTER — Ambulatory Visit: Payer: 59 | Admitting: Physician Assistant

## 2018-01-21 VITALS — BP 138/70 | HR 83 | Ht 66.5 in | Wt 213.0 lb

## 2018-01-21 DIAGNOSIS — R4589 Other symptoms and signs involving emotional state: Secondary | ICD-10-CM | POA: Diagnosis not present

## 2018-01-21 DIAGNOSIS — F329 Major depressive disorder, single episode, unspecified: Secondary | ICD-10-CM

## 2018-01-21 DIAGNOSIS — R0789 Other chest pain: Secondary | ICD-10-CM | POA: Diagnosis not present

## 2018-01-21 DIAGNOSIS — F419 Anxiety disorder, unspecified: Secondary | ICD-10-CM

## 2018-01-21 DIAGNOSIS — F5101 Primary insomnia: Secondary | ICD-10-CM

## 2018-01-21 DIAGNOSIS — R41840 Attention and concentration deficit: Secondary | ICD-10-CM | POA: Insufficient documentation

## 2018-01-21 DIAGNOSIS — R5383 Other fatigue: Secondary | ICD-10-CM | POA: Diagnosis not present

## 2018-01-21 DIAGNOSIS — F32A Depression, unspecified: Secondary | ICD-10-CM

## 2018-01-21 MED ORDER — DEXLANSOPRAZOLE 60 MG PO CPDR: 60.0000 mg | DELAYED_RELEASE_CAPSULE | Freq: Every day | ORAL | 1 refills | Status: DC

## 2018-01-21 MED ORDER — MONTELUKAST SODIUM 10 MG PO TABS: 10.0000 mg | ORAL_TABLET | Freq: Every day | ORAL | 3 refills | Status: DC

## 2018-01-21 MED ORDER — ALPRAZOLAM 0.5 MG PO TABS: 0.5000 mg | ORAL_TABLET | Freq: Every evening | ORAL | 1 refills | Status: DC | PRN

## 2018-01-21 MED ORDER — VORTIOXETINE HBR 20 MG PO TABS: 20.0000 mg | ORAL_TABLET | Freq: Every day | ORAL | 1 refills | Status: DC

## 2018-01-21 MED ORDER — TRAZODONE HCL 50 MG PO TABS: 50.0000 mg | ORAL_TABLET | Freq: Every day | ORAL | 1 refills | Status: DC

## 2018-01-21 MED ORDER — BUSPIRONE HCL 5 MG PO TABS: 5.0000 mg | ORAL_TABLET | Freq: Two times a day (BID) | ORAL | 1 refills | Status: DC

## 2018-01-21 NOTE — Progress Notes (Signed)
Subjective:    Patient ID: Denise Craig, female    DOB: 1971-07-25, 47 y.o.   MRN: [id number]  HPI  Pt is a 47 yo female who presents to the clinic to for medicaiton refill and follow up.   GERD- prontonix not working. Tried zantac and omeprazole. At times acid is so bad she vomits. She at times feels chest tightness as well. Resolves on its on.   Trouble sleeping. Pt having dificultly getting to sleep. She denies any depression/anxiety. Pt declines depression causing this. Seems to be more and more jitteriness. Started trintellix in 2017. At first felt like helping a lot. She c/o difficultly concentration. No energy.   Problems sleeping. Using xanax at night a lot.   .. Active Ambulatory Problems    Diagnosis Date Noted  . THYROMEGALY 12/11/2007  . OBESITY, NOS 06/11/2006  . Iron deficiency anemia 05/16/2007  . ANEMIA, VITAMIN B12 DEFICIENCY 06/04/2007  . Anxiety state 06/11/2006  . MIGRAINE, UNSPEC., W/O INTRACTABLE MIGRAINE 06/11/2006  . ASTHMA, PERSISTENT, MODERATE 05/16/2007  . HIATAL HERNIA 12/22/2007  . IBS 06/16/2009  . ENDOMETRIOSIS 06/11/2006  . INSOMNIA 07/15/2008  . Essential hypertension, benign 02/27/2011  . GERD (gastroesophageal reflux disease) 09/17/2012  . Nephrolithiasis 07/14/2013  . Periumbilical abdominal pain 01/25/2016  . 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 Anxiety and depression 06/01/2016  . Hot flashes 09/21/2016  . Family history of breast cancer 09/21/2016  . At risk for impaired digestion 09/21/2016  . Menopause 09/24/2016  . Vitamin D deficiency 09/24/2016  . Low iron stores 09/24/2016  . Fidgeting 01/21/2018  . Chest tightness 01/21/2018  . No energy 01/21/2018  . Poor concentration 01/21/2018   Resolved Ambulatory Problems    Diagnosis Date Noted  . Leg edema 01/02/2011   Past Medical History:  Diagnosis Date  . Anxiety   . Asthma   . B12 deficiency   . Depression   . GERD (gastroesophageal reflux disease)   .  Infertility, female   . Iron deficiency   . Migraine   . Nephrolithiasis   . PONV (postoperative nausea and vomiting)   . 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 Uterine polyp      Review of Systems  All other systems reviewed and are negative.      Objective:   Physical Exam  Constitutional: She is oriented to person, place, and time. She appears well-developed and well-nourished.  HENT:  Head: Normocephalic and atraumatic.  Cardiovascular: Normal rate and regular rhythm.  Pulmonary/Chest: Effort normal and breath sounds normal.  Neurological: She is alert and oriented to person, place, and time.  Psychiatric: She has a normal mood and affect. Her behavior is normal.          Assessment & Plan:  Marland KitchenMarland KitchenDiagnoses and all orders for this visit:  Anxiety and depression -     busPIRone (BUSPAR) 5 MG tablet; Take 1 tablet (5 mg total) by mouth 2 (two) times daily. -     vortioxetine HBr (TRINTELLIX) 20 MG TABS tablet; Take 1 tablet (20 mg total) by mouth daily. -     ALPRAZolam (XANAX) 0.5 MG tablet; Take 1 tablet (0.5 mg total) by mouth at bedtime as needed for anxiety.  Poor concentration -     B12 -     Vitamin D 1,25 dihydroxy -     TSH -     CBC with Differential/Platelet -     Fe+TIBC+Fer  No energy -     B12 -  Vitamin D 1,25 dihydroxy -     TSH -     CBC with Differential/Platelet -     Fe+TIBC+Fer  Primary insomnia -     traZODone (DESYREL) 50 MG tablet; Take 1 tablet (50 mg total) by mouth at bedtime.  Chest tightness -     dexlansoprazole (DEXILANT) 60 MG capsule; Take 1 capsule (60 mg total) by mouth daily.  Fidgeting  Other orders -     montelukast (SINGULAIR) 10 MG tablet; Take 1 tablet (10 mg total) by mouth at bedtime.   .. Depression screen [facility] 2/9 01/21/2018 12/28/2016 06/29/2016  Decreased Interest 1 0 1  Down, Depressed, Hopeless 2 1 1   PHQ - 2 Score 3 1 2   Altered sleeping 3 2 1   Tired, decreased energy 3 2 1   Change in appetite 2 2 1    Feeling bad or failure about yourself  0 1 0  Trouble concentrating 1 1 1   Moving slowly or fidgety/restless 1 0 0  Suicidal thoughts 0 0 0  PHQ-9 Score 13 9 6   Difficult doing work/chores Somewhat difficult - -   .Marland Kitchen GAD 7 : Generalized Anxiety Score 01/21/2018 12/28/2016 06/29/2016  Nervous, Anxious, on Edge 2 1 1   Control/stop worrying 1 1 1   Worry too much - different things 1 1 1   Trouble relaxing 2 2 0  Restless 2 1 0  Easily annoyed or irritable 2 2 1   Afraid - awful might happen 0 0 0  Total GAD 7 Score 10 8 4   Anxiety Difficulty Somewhat difficult Somewhat difficult Somewhat difficult    Discussed importance of sleep. Sent trazodone. Discussed good sleep hygiene.  Labs ordered.  Added buspar for anxiety.  Pt declined counseling.  Trazodone given for sleep.  Follow up in 1 month.   I suspect chest tightness is GI related. Stop protonix and try dexilant.   Marland Kitchen.Spent 30 minutes with patient and greater than 50 percent of visit spent counseling patient regarding treatment plan.

## 2018-01-27 ENCOUNTER — Encounter: Payer: Self-pay | Admitting: Physician Assistant

## 2018-01-28 ENCOUNTER — Encounter: Payer: Self-pay | Admitting: Physician Assistant

## 2018-01-28 DIAGNOSIS — R748 Abnormal levels of other serum enzymes: Secondary | ICD-10-CM

## 2018-01-28 NOTE — Progress Notes (Signed)
Lets see if we can add or have come back in for cmp.

## 2018-01-28 NOTE — Progress Notes (Signed)
Call pt: B12 too high. What supplement are you taking? I would cut in half. Recheck in 2 months.  Thyroid looks great.  Iron stores low but not anemic. Increase iron rich foods or add daily iron supplement. Goal ferritin is 40.   Still pending vitamin D.

## 2018-01-29 LAB — IRON,TIBC AND FERRITIN PANEL
%SAT: 32 % (ref 11–50)
FERRITIN: 26 ng/mL (ref 10–232)
IRON: 98 ug/dL (ref 40–190)
TIBC: 306 ug/dL (ref 250–450)

## 2018-01-29 LAB — COMPLETE METABOLIC PANEL WITH GFR
AG RATIO: 1.8 (calc) (ref 1.0–2.5)
ALKALINE PHOSPHATASE (APISO): 63 U/L (ref 33–115)
ALT: 21 U/L (ref 6–29)
AST: 19 U/L (ref 10–35)
Albumin: 4 g/dL (ref 3.6–5.1)
BILIRUBIN TOTAL: 0.3 mg/dL (ref 0.2–1.2)
BUN: 14 mg/dL (ref 7–25)
CHLORIDE: 108 mmol/L (ref 98–110)
CO2: 19 mmol/L — ABNORMAL LOW (ref 20–32)
Calcium: 9.2 mg/dL (ref 8.6–10.2)
Creat: 0.76 mg/dL (ref 0.50–1.10)
GFR, EST AFRICAN AMERICAN: 109 mL/min/{1.73_m2} (ref >=60)
GFR, Est Non African American: 94 mL/min/{1.73_m2} (ref >=60)
GLUCOSE: 92 mg/dL (ref 65–99)
Globulin: 2.2 g/dL (calc) (ref 1.9–3.7)
POTASSIUM: 4.4 mmol/L (ref 3.5–5.3)
Sodium: 143 mmol/L (ref 135–146)
TOTAL PROTEIN: 6.2 g/dL (ref 6.1–8.1)

## 2018-01-29 LAB — CBC WITH DIFFERENTIAL/PLATELET
BASOS PCT: 1.1 %
Basophils Absolute: 52 cells/uL (ref 0–200)
EOS PCT: 4 %
Eosinophils Absolute: 188 cells/uL (ref 15–500)
HCT: 37.9 % (ref 35.0–45.0)
HEMOGLOBIN: 12.7 g/dL (ref 11.7–15.5)
Lymphs Abs: 1288 cells/uL (ref 850–3900)
MCH: 29.2 pg (ref 27.0–33.0)
MCHC: 33.5 g/dL (ref 32.0–36.0)
MCV: 87.1 fL (ref 80.0–100.0)
MONOS PCT: 8.5 %
MPV: 11.3 fL (ref 7.5–12.5)
NEUTROS ABS: 2773 {cells}/uL (ref 1500–7800)
Neutrophils Relative %: 59 %
PLATELETS: 180 10*3/uL (ref 140–400)
RBC: 4.35 10*6/uL (ref 3.80–5.10)
RDW: 14 % (ref 11.0–15.0)
TOTAL LYMPHOCYTE: 27.4 %
WBC mixed population: 400 cells/uL (ref 200–950)
WBC: 4.7 10*3/uL (ref 3.8–10.8)

## 2018-01-29 LAB — VITAMIN D 1,25 DIHYDROXY
VITAMIN D3 1, 25 (OH): 79 pg/mL
Vitamin D 1, 25 (OH)2 Total: 79 pg/mL — ABNORMAL HIGH (ref 18–72)

## 2018-01-29 LAB — VITAMIN B12

## 2018-01-29 LAB — TSH: TSH: 2.31 m[IU]/L

## 2018-01-31 ENCOUNTER — Telehealth: Payer: Self-pay | Admitting: Physician Assistant

## 2018-01-31 NOTE — Telephone Encounter (Signed)
-----   Message from Katha Hamming sent at 01/30/2018 10:00 AM EDT ----- Regarding: [place] elastrography We do not do the [place] elastography yet.   So Briley will have to be scheduled at the hospital.  Thanks, Hoyle Sauer

## 2018-01-31 NOTE — Telephone Encounter (Signed)
[facility] notified. Pt requested to be contacted for scheduling. They will call her .

## 2018-02-04 ENCOUNTER — Other Ambulatory Visit: Payer: Self-pay | Admitting: Physician Assistant

## 2018-02-04 NOTE — Progress Notes (Signed)
They did not result in my in basket. But I viewed them and look good.

## 2018-02-10 ENCOUNTER — Other Ambulatory Visit: Payer: Self-pay | Admitting: Physician Assistant

## 2018-02-10 DIAGNOSIS — F5101 Primary insomnia: Secondary | ICD-10-CM

## 2018-02-10 DIAGNOSIS — F32A Depression, unspecified: Secondary | ICD-10-CM

## 2018-02-10 DIAGNOSIS — F419 Anxiety disorder, unspecified: Secondary | ICD-10-CM

## 2018-02-10 DIAGNOSIS — F329 Major depressive disorder, single episode, unspecified: Secondary | ICD-10-CM

## 2018-03-04 ENCOUNTER — Ambulatory Visit: Payer: Self-pay | Admitting: Physician Assistant

## 2018-03-18 ENCOUNTER — Ambulatory Visit: Payer: Self-pay | Admitting: Physician Assistant

## 2018-03-18 ENCOUNTER — Encounter: Payer: Self-pay | Admitting: Physician Assistant

## 2018-03-18 ENCOUNTER — Ambulatory Visit (INDEPENDENT_AMBULATORY_CARE_PROVIDER_SITE_OTHER): Payer: 59 | Admitting: Physician Assistant

## 2018-03-18 VITALS — BP 140/81 | HR 87 | Wt 211.0 lb

## 2018-03-18 DIAGNOSIS — N200 Calculus of kidney: Secondary | ICD-10-CM

## 2018-03-18 DIAGNOSIS — F5101 Primary insomnia: Secondary | ICD-10-CM | POA: Diagnosis not present

## 2018-03-18 DIAGNOSIS — F329 Major depressive disorder, single episode, unspecified: Secondary | ICD-10-CM | POA: Diagnosis not present

## 2018-03-18 DIAGNOSIS — F32A Depression, unspecified: Secondary | ICD-10-CM

## 2018-03-18 DIAGNOSIS — F419 Anxiety disorder, unspecified: Secondary | ICD-10-CM

## 2018-03-18 MED ORDER — BUSPIRONE HCL 10 MG PO TABS: 10.0000 mg | ORAL_TABLET | Freq: Two times a day (BID) | ORAL | 5 refills | Status: DC

## 2018-03-18 MED ORDER — TRAZODONE HCL 50 MG PO TABS: ORAL_TABLET | ORAL | 5 refills | Status: DC

## 2018-03-18 NOTE — Progress Notes (Signed)
Subjective:    Patient ID: Denise Craig, female    DOB: 21-Feb-1971, 47 y.o.   MRN: [id number]  HPI  Pt is a 47 yo female being treated for current kidney stone she has a 6mm stone that she underwent lithotripsy for last week she does not notice that she has passed any.   For sleep trazadone does not seem to be working. She has only tried the 50mg  dose. Her anixety and depression is some better with buspar.   .. Active Ambulatory Problems    Diagnosis Date Noted  . THYROMEGALY 12/11/2007  . OBESITY, NOS 06/11/2006  . Iron deficiency anemia 05/16/2007  . ANEMIA, VITAMIN B12 DEFICIENCY 06/04/2007  . Anxiety state 06/11/2006  . MIGRAINE, UNSPEC., W/O INTRACTABLE MIGRAINE 06/11/2006  . ASTHMA, PERSISTENT, MODERATE 05/16/2007  . HIATAL HERNIA 12/22/2007  . IBS 06/16/2009  . ENDOMETRIOSIS 06/11/2006  . INSOMNIA 07/15/2008  . Essential hypertension, benign 02/27/2011  . GERD (gastroesophageal reflux disease) 09/17/2012  . Nephrolithiasis 07/14/2013  . Periumbilical abdominal pain 01/25/2016  . 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 Anxiety and depression 06/01/2016  . Hot flashes 09/21/2016  . Family history of breast cancer 09/21/2016  . At risk for impaired digestion 09/21/2016  . Menopause 09/24/2016  . Vitamin D deficiency 09/24/2016  . Low iron stores 09/24/2016  . Fidgeting 01/21/2018  . Chest tightness 01/21/2018  . No energy 01/21/2018  . Poor concentration 01/21/2018   Resolved Ambulatory Problems    Diagnosis Date Noted  . Leg edema 01/02/2011   Past Medical History:  Diagnosis Date  . Anxiety   . Asthma   . B12 deficiency   . Depression   . GERD (gastroesophageal reflux disease)   . Infertility, female   . Iron deficiency   . Migraine   . Nephrolithiasis   . PONV (postoperative nausea and vomiting)   . 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 Uterine polyp       Review of Systems  Neurological: Negative for speech difficulty.        Objective:   Physical Exam  Constitutional: She is oriented to person, place, and time. She appears well-developed and well-nourished.  HENT:  Head: Normocephalic and atraumatic.  Cardiovascular: Normal rate and regular rhythm.  Pulmonary/Chest: Effort normal and breath sounds normal.  Neurological: She is alert and oriented to person, place, and time.  Psychiatric: She has a normal mood and affect. Her behavior is normal.          Assessment & Plan:  Marland KitchenMarland KitchenDane was seen today for follow-up.  Diagnoses and all orders for this visit:  Nephrolithiasis  Primary insomnia -     traZODone (DESYREL) 50 MG tablet; Take one to three tablets and bedtime.  Anxiety and depression -     busPIRone (BUSPAR) 10 MG tablet; Take 1 tablet (10 mg total) by mouth 2 (two) times daily.    Depression screen [facility] 2/9 03/23/2018 01/21/2018 12/28/2016 06/29/2016  Decreased Interest 1 1 0 1  Down, Depressed, Hopeless 0 2 1 1   PHQ - 2 Score 1 3 1 2   Altered sleeping 3 3 2 1   Tired, decreased energy 3 3 2 1   Change in appetite 0 2 2 1   Feeling bad or failure about yourself  0 0 1 0  Trouble concentrating 0 1 1 1   Moving slowly or fidgety/restless 0 1 0 0  Suicidal thoughts 0 0 0 0  PHQ-9 Score 7 13 9 6   Difficult doing work/chores Not  difficult at all Somewhat difficult - -   Continue to work with urology on kidney stone. Keep hydrated.   Increased buspar to 10mg  bid as well as trazodone up to 3 tablets. Continue to discussed good sleep routine. Follow up as needed. Continue on trintellix. Follow up in 3 months.

## 2018-03-23 ENCOUNTER — Encounter: Payer: Self-pay | Admitting: Physician Assistant

## 2018-04-07 ENCOUNTER — Other Ambulatory Visit: Payer: Self-pay

## 2018-04-07 ENCOUNTER — Emergency Department (HOSPITAL_COMMUNITY)
Admission: EM | Admit: 2018-04-07 | Discharge: 2018-04-07 | Disposition: A | Discharge status: Home / Self Care | Payer: 59 | Attending: Emergency Medicine | Admitting: Emergency Medicine

## 2018-04-07 ENCOUNTER — Encounter (HOSPITAL_COMMUNITY): Payer: Self-pay

## 2018-04-07 DIAGNOSIS — Z79899 Other long term (current) drug therapy: Secondary | ICD-10-CM | POA: Insufficient documentation

## 2018-04-07 DIAGNOSIS — E876 Hypokalemia: Secondary | ICD-10-CM | POA: Diagnosis not present

## 2018-04-07 DIAGNOSIS — R509 Fever, unspecified: Secondary | ICD-10-CM | POA: Diagnosis not present

## 2018-04-07 DIAGNOSIS — R5381 Other malaise: Secondary | ICD-10-CM | POA: Insufficient documentation

## 2018-04-07 DIAGNOSIS — J45909 Unspecified asthma, uncomplicated: Secondary | ICD-10-CM | POA: Insufficient documentation

## 2018-04-07 DIAGNOSIS — R112 Nausea with vomiting, unspecified: Secondary | ICD-10-CM | POA: Insufficient documentation

## 2018-04-07 DIAGNOSIS — R110 Nausea: Secondary | ICD-10-CM

## 2018-04-07 DIAGNOSIS — I10 Essential (primary) hypertension: Secondary | ICD-10-CM | POA: Insufficient documentation

## 2018-04-07 LAB — COMPREHENSIVE METABOLIC PANEL
ALBUMIN: 3.1 g/dL — AB (ref 3.5–5.0)
ALT: 23 U/L (ref 0–44)
ANION GAP: 7 (ref 5–15)
AST: 23 U/L (ref 15–41)
Alkaline Phosphatase: 60 U/L (ref 38–126)
BUN: 8 mg/dL (ref 6–20)
CHLORIDE: 109 mmol/L (ref 98–111)
CO2: 27 mmol/L (ref 22–32)
Calcium: 8.3 mg/dL — ABNORMAL LOW (ref 8.9–10.3)
Creatinine, Ser: 0.67 mg/dL (ref 0.44–1.00)
GFR calc Af Amer: >60 mL/min (ref >60)
GFR calc non Af Amer: >60 mL/min (ref >60)
GLUCOSE: 98 mg/dL (ref 70–99)
POTASSIUM: 2.9 mmol/L — AB (ref 3.5–5.1)
SODIUM: 143 mmol/L (ref 135–145)
Total Bilirubin: 0.3 mg/dL (ref 0.3–1.2)
Total Protein: 5.9 g/dL — ABNORMAL LOW (ref 6.5–8.1)

## 2018-04-07 LAB — URINALYSIS, ROUTINE W REFLEX MICROSCOPIC
BILIRUBIN URINE: NEGATIVE
Glucose, UA: NEGATIVE mg/dL
HGB URINE DIPSTICK: NEGATIVE
Ketones, ur: NEGATIVE mg/dL
Leukocytes, UA: NEGATIVE
NITRITE: NEGATIVE
PROTEIN: NEGATIVE mg/dL
SPECIFIC GRAVITY, URINE: 1.023 (ref 1.005–1.030)
pH: 5 (ref 5.0–8.0)

## 2018-04-07 LAB — CBC
HEMATOCRIT: 38.9 % (ref 36.0–46.0)
HEMOGLOBIN: 12.9 g/dL (ref 12.0–15.0)
MCH: 29.5 pg (ref 26.0–34.0)
MCHC: 33.2 g/dL (ref 30.0–36.0)
MCV: 88.8 fL (ref 78.0–100.0)
Platelets: 265 10*3/uL (ref 150–400)
RBC: 4.38 MIL/uL (ref 3.87–5.11)
RDW: 13.9 % (ref 11.5–15.5)
WBC: 6.2 10*3/uL (ref 4.0–10.5)

## 2018-04-07 LAB — I-STAT BETA HCG BLOOD, ED (MC, WL, AP ONLY): HCG, QUANTITATIVE: 9.3 m[IU]/mL — AB (ref <5)

## 2018-04-07 LAB — LIPASE, BLOOD: LIPASE: 26 U/L (ref 11–51)

## 2018-04-07 MED ORDER — ONDANSETRON HCL 4 MG/2ML IJ SOLN
4.0000 mg | Freq: Once | INTRAMUSCULAR | Status: AC
  Administered 2018-04-07: 4 mg via INTRAVENOUS
  Filled 2018-04-07: qty 2

## 2018-04-07 MED ORDER — POTASSIUM CHLORIDE CRYS ER 20 MEQ PO TBCR
40.0000 meq | EXTENDED_RELEASE_TABLET | Freq: Once | ORAL | Status: AC
  Administered 2018-04-07: 40 meq via ORAL
  Filled 2018-04-07: qty 2

## 2018-04-07 MED ORDER — SODIUM CHLORIDE 0.9 % IV BOLUS
1000.0000 mL | Freq: Once | INTRAVENOUS | Status: AC
  Administered 2018-04-07: 1000 mL via INTRAVENOUS

## 2018-04-07 MED ORDER — ONDANSETRON HCL 8 MG PO TABS: 8.0000 mg | ORAL_TABLET | Freq: Three times a day (TID) | ORAL | 0 refills | Status: DC | PRN

## 2018-04-07 NOTE — Discharge Instructions (Addendum)
The testing today did not show any clear abnormalities, to explain your discomfort.  Her potassium was somewhat low.  Try to eat foods which contain more potassium.  We have sent test to you for various tickborne illnesses.  He should return in about 72 hours.  Follow-up with your primary care doctor for a checkup next week.Marland Kitchen

## 2018-04-07 NOTE — ED Triage Notes (Signed)
Pt states that she has been very nauseated and has had diarrhea all weekend. Pt states she is achy all over. Pt recently dx with Lymes disease 3 weeks ago. Pt states she completed her full course of abx about a week ago.

## 2018-04-07 NOTE — ED Provider Notes (Signed)
[place] [facility] [facility] DEPT Provider Note   CSN: [id number] Arrival date & time: 04/07/18  7106     History   Chief Complaint Chief Complaint  Patient presents with  . Diarrhea  . Generalized Body Aches    HPI Denise Craig is a 47 y.o. female.  HPI   She presents for evaluation of intermittent nausea and vomiting for several weeks and concern for "Lyme disease."  For the last 3 days she has had difficulty tolerating any food or liquids.  She has persistent nausea and vomiting without diarrhea.  There is been no blood in the emesis.  She is felt warm but does not have a documented fever.  3 weeks ago she was treated for possible tick borne infection with doxycycline, for 10 days.  She has not had rash.  She has a general feeling of ill-ease.  She denies headache, neck pain, back pain or abdominal pain.  No prior tick borne illnesses.  She has not had testing for Lyme disease recently.  There are no other known modifying factors.  Past Medical History:  Diagnosis Date  . Anxiety   . Asthma    has not used inhaler in past 10 months-will bring dos  . B12 deficiency   . Depression   . GERD (gastroesophageal reflux disease)    w/ moderate hiatal hernia (EGD 3-09), Dr August Saucer prn, prilosec  . Infertility, female   . Iron deficiency   . Migraine    sleep problems- sees Dr Berdine Addison, EEG normal 12-07  . Nephrolithiasis   . PONV (postoperative nausea and vomiting)   . 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 Uterine polyp     Patient Active Problem List   Diagnosis Date Noted  . Fidgeting 01/21/2018  . Chest tightness 01/21/2018  . No energy 01/21/2018  . Poor concentration 01/21/2018  . Menopause 09/24/2016  . Vitamin D deficiency 09/24/2016  . Low iron stores 09/24/2016  . Hot flashes 09/21/2016  . Family history of breast cancer 09/21/2016  . At risk for impaired digestion 09/21/2016  . 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  Anxiety and depression 06/01/2016  . Periumbilical abdominal pain 01/25/2016  . Nephrolithiasis 07/14/2013  . GERD (gastroesophageal reflux disease) 09/17/2012  . Essential hypertension, benign 02/27/2011  . IBS 06/16/2009  . INSOMNIA 07/15/2008  . HIATAL HERNIA 12/22/2007  . THYROMEGALY 12/11/2007  . ANEMIA, VITAMIN B12 DEFICIENCY 06/04/2007  . Iron deficiency anemia 05/16/2007  . ASTHMA, PERSISTENT, MODERATE 05/16/2007  . OBESITY, NOS 06/11/2006  . Anxiety state 06/11/2006  . MIGRAINE, UNSPEC., W/O INTRACTABLE MIGRAINE 06/11/2006  . ENDOMETRIOSIS 06/11/2006    Past Surgical History:  Procedure Laterality Date  . APPENDECTOMY    . endometrial ablation spring  2008  . GASTRIC BYPASS  2013  . HYSTEROSCOPY WITH NOVASURE  08/12/2012   Procedure: HYSTEROSCOPY WITH NOVASURE;  Surgeon: Marylynn Pearson, MD;  Location: [facility];  Service: Gynecology;  Laterality: N/A;  . LAPAROSCOPIC TUBAL LIGATION  08/12/2012   Procedure: LAPAROSCOPIC TUBAL LIGATION;  Surgeon: Marylynn Pearson, MD;  Location: [facility];  Service: Gynecology;  Laterality: Bilateral;  WITH FILSHIE CLIPS  . right knee lateral release surgery  2011  . TONSILLECTOMY    . tumor removed  2012   removed right ankle     OB History   None      Home Medications    Prior to Admission medications   Medication Sig Start Date End Date Taking? Authorizing Provider  ALPRAZolam (XANAX) 0.5 MG tablet Take 1 tablet (0.5 mg total) by mouth at bedtime as needed for anxiety. 01/21/18  Yes Breeback, Jade L, PA-C  busPIRone (BUSPAR) 10 MG tablet Take 1 tablet (10 mg total) by mouth 2 (two) times daily. 03/18/18  Yes Breeback, Jade L, PA-C  doxepin (SINEQUAN) 25 MG capsule Take 25 mg by mouth at bedtime.  04/03/16  Yes [provider]  montelukast (SINGULAIR) 10 MG tablet Take 1 tablet (10 mg total) by mouth at bedtime. 01/21/18  Yes Breeback, Jade L, PA-C  Multiple Vitamins-Minerals (MULTIVITAMIN WITH MINERALS) tablet Take 1 tablet by  mouth daily.    Yes [provider]  pantoprazole (PROTONIX) 40 MG tablet Take 40 mg by mouth 2 (two) times daily. 02/13/18  Yes [provider]  tamsulosin (FLOMAX) 0.4 MG CAPS capsule Take 0.4 mg by mouth daily. 03/10/18  Yes [provider]  traZODone (DESYREL) 50 MG tablet Take one to three tablets and bedtime. 03/18/18  Yes Breeback, Jade L, PA-C  vortioxetine HBr (TRINTELLIX) 20 MG TABS tablet Take 1 tablet (20 mg total) by mouth daily. 01/21/18  Yes Breeback, Jade L, PA-C  dexlansoprazole (DEXILANT) 60 MG capsule Take 1 capsule (60 mg total) by mouth daily. Patient not taking: Reported on 04/07/2018 01/21/18   Donella Stade, PA-C  ondansetron (ZOFRAN) 8 MG tablet Take 1 tablet (8 mg total) by mouth every 8 (eight) hours as needed for nausea or vomiting. 04/07/18   Daleen Bo, MD    Family History Family History  Problem Relation Age of Onset  . Cancer Father        metastatic, colon ca  . Hypertension Mother   . Diabetes Mother   . Breast cancer Mother   . Cancer Mother 26       breast cancer  . Breast cancer Maternal Aunt   . Breast cancer Paternal Aunt   . Breast cancer Maternal Grandmother   . Breast cancer Paternal Grandmother     Social History Social History   Tobacco Use  . Smoking status: Never Smoker  . Smokeless tobacco: Never Used  Substance Use Topics  . Alcohol use: No  . Drug use: No     Allergies   Morphine and related and Prednisone   Review of Systems Review of Systems  All other systems reviewed and are negative.    Physical Exam Updated Vital Signs BP (!) 146/113 (BP Location: Right Arm)   Pulse 86   Temp 98 F (36.7 C) (Oral)   Resp 12   Ht 5\' 6"  (1.676 m)   Wt 93 kg (205 lb)   SpO2 98%   BMI 33.09 kg/m   Physical Exam  Constitutional: She is oriented to person, place, and time. She appears well-developed and well-nourished. She appears distressed (She is uncomfortable).  HENT:  Head: Normocephalic and  atraumatic.  Eyes: Pupils are equal, round, and reactive to light. Conjunctivae and EOM are normal. Right eye exhibits no discharge. Left eye exhibits no discharge. No scleral icterus.  Neck: Normal range of motion and phonation normal. Neck supple.  Cardiovascular: Normal rate and regular rhythm.  Pulmonary/Chest: Effort normal and breath sounds normal. No respiratory distress. She exhibits no tenderness.  Abdominal: Soft. She exhibits no distension. There is no tenderness. There is no guarding.  Musculoskeletal: Normal range of motion.  Neurological: She is alert and oriented to person, place, and time. She exhibits normal muscle tone.  Skin: Skin is warm and dry.  No visible rash  Psychiatric: She has a normal mood and affect. Her behavior is normal. Judgment and thought content normal.  Nursing note and vitals reviewed.    ED Treatments / Results  Labs (all labs ordered are listed, but only abnormal results are displayed) Labs Reviewed  COMPREHENSIVE METABOLIC PANEL - Abnormal; Notable for the following components:      Result Value   Potassium 2.9 (*)    Calcium 8.3 (*)    Total Protein 5.9 (*)    Albumin 3.1 (*)    All other components within normal limits  I-STAT BETA HCG BLOOD, ED ([facility], [facility], [facility] ONLY) - Abnormal; Notable for the following components:   I-stat hCG, quantitative 9.3 (*)    All other components within normal limits  LIPASE, BLOOD  CBC  URINALYSIS, ROUTINE W REFLEX MICROSCOPIC  LYME DISEASE DNA BY PCR(BORRELIA BURG)  B. BURGDORFI ANTIBODIES  ROCKY MTN SPOTTED FVR ABS PNL(IGG+IGM)  EHRLICHIA ANTIBODY PANEL    EKG None  Radiology No results found.  Procedures Procedures (including critical care time)  Medications Ordered in ED Medications  potassium chloride SA (K-DUR,KLOR-CON) CR tablet 40 mEq (has no administration in time range)  sodium chloride 0.9 % bolus 1,000 mL (0 mLs Intravenous Stopped 04/07/18 1232)  ondansetron (ZOFRAN) injection 4 mg (4 mg  Intravenous Given 04/07/18 1107)     Initial Impression / Assessment and Plan / ED Course  I have reviewed the triage vital signs and the nursing notes.  Pertinent labs & imaging results that were available during my care of the patient were reviewed by me and considered in my medical decision making (see chart for details).  Clinical Course as of Apr 07 1234  Mon Apr 07, 2018  1225 Nonspecific elevation  I-Stat beta hCG blood, ED(!) [EW]  1225 Normal except potassium low, calcium low, total protein low, albumin low  Comprehensive metabolic panel(!) [EW]  4270 Normal  Urinalysis, Routine w reflex microscopic [EW]  1225 Normal  Lipase, blood [EW]  1225 Normal  CBC [EW]    Clinical Course User Index [EW] Daleen Bo, MD     Patient Vitals for the past 24 hrs:  BP Temp Temp src Pulse Resp SpO2 Height Weight  04/07/18 0956 - - - - - - 5\' 6"  (1.676 m) 93 kg (205 lb)  04/07/18 0950 (!) 146/113 98 F (36.7 C) Oral 86 12 98 % - -    12:31 PM Reevaluation with update and discussion. After initial assessment and treatment, an updated evaluation reveals he states she is feels some better after treatment with IV fluids and Zofran.  Findings discussed with the patient and all questions were answered. Daleen Bo   Medical Decision Making: Malaise following prolonged illness with nausea.  Symptoms are nonspecific.  Patient previously treated for possible Lyme disease however this was done empirically.  Titers for tickborne illness sent today.  No overt sign of infectious process at this time.  No indication for hospitalization or further intervention in the ED setting.  CRITICAL CARE-no Performed by: Daleen Bo   Nursing Notes Reviewed/ Care Coordinated Applicable Imaging Reviewed Interpretation of Laboratory Data incorporated into ED treatment  The patient appears reasonably screened and/or stabilized for discharge and I doubt any other medical condition or other [facility] requiring  further screening, evaluation, or treatment in the ED at this time prior to discharge.  Plan: Home Medications-OTC, as needed.  Usual medications; Home Treatments-rest, fluids, gradually advance diet; return here if the recommended  treatment, does not improve the symptoms; Recommended follow up-PCP follow-up 1 week for further evaluation and treatment.    Final Clinical Impressions(s) / ED Diagnoses   Final diagnoses:  Nausea  Hypokalemia    ED Discharge Orders        Ordered    ondansetron (ZOFRAN) 8 MG tablet  Every 8 hours PRN     04/07/18 1233       Daleen Bo, MD 04/07/18 1235

## 2018-04-08 LAB — B. BURGDORFI ANTIBODIES: B burgdorferi Ab IgG+IgM: <0.91 {ISR} (ref 0.00–0.90)

## 2018-04-09 LAB — EHRLICHIA ANTIBODY PANEL
E chaffeensis (HGE) Ab, IgG: NEGATIVE
E chaffeensis (HGE) Ab, IgM: NEGATIVE
E. CHAFFEENSIS (HME) IGM TITER: NEGATIVE
E.Chaffeensis (HME) IgG: NEGATIVE

## 2018-04-09 LAB — ROCKY MTN SPOTTED FVR ABS PNL(IGG+IGM)
RMSF IgG: NEGATIVE
RMSF IgM: 0.88 index (ref 0.00–0.89)

## 2018-04-10 LAB — LYME DISEASE DNA BY PCR(BORRELIA BURG): Lyme Disease(B.burgdorferi)PCR: NEGATIVE

## 2018-05-23 ENCOUNTER — Encounter: Payer: Self-pay | Admitting: Physician Assistant

## 2018-05-23 ENCOUNTER — Ambulatory Visit: Payer: 59 | Admitting: Physician Assistant

## 2018-05-23 ENCOUNTER — Ambulatory Visit (INDEPENDENT_AMBULATORY_CARE_PROVIDER_SITE_OTHER): Payer: 59 | Admitting: Physician Assistant

## 2018-05-23 VITALS — BP 159/80 | HR 74 | Ht 66.0 in | Wt 210.0 lb

## 2018-05-23 DIAGNOSIS — G43009 Migraine without aura, not intractable, without status migrainosus: Secondary | ICD-10-CM

## 2018-05-23 DIAGNOSIS — T753XXA Motion sickness, initial encounter: Secondary | ICD-10-CM | POA: Diagnosis not present

## 2018-05-23 DIAGNOSIS — E6609 Other obesity due to excess calories: Secondary | ICD-10-CM

## 2018-05-23 DIAGNOSIS — Z6833 Body mass index (BMI) 33.0-33.9, adult: Secondary | ICD-10-CM

## 2018-05-23 DIAGNOSIS — I10 Essential (primary) hypertension: Secondary | ICD-10-CM | POA: Diagnosis not present

## 2018-05-23 MED ORDER — SCOPOLAMINE 1 MG/3DAYS TD PT72: 1.0000 | MEDICATED_PATCH | TRANSDERMAL | 0 refills | Status: DC

## 2018-05-23 MED ORDER — SUMATRIPTAN SUCCINATE 100 MG PO TABS: 100.0000 mg | ORAL_TABLET | ORAL | 5 refills | Status: DC | PRN

## 2018-05-23 MED ORDER — LIRAGLUTIDE -WEIGHT MANAGEMENT 18 MG/3ML ~~LOC~~ SOPN
0.6000 mg | PEN_INJECTOR | Freq: Every day | SUBCUTANEOUS | 1 refills | Status: DC
Start: 2018-05-23 — End: 2018-07-09

## 2018-05-23 MED ORDER — GALCANEZUMAB-GNLM 120 MG/ML ~~LOC~~ SOAJ
120.0000 mg | SUBCUTANEOUS | 5 refills | Status: DC
Start: 2018-05-23 — End: 2018-06-04

## 2018-05-23 NOTE — Progress Notes (Signed)
Subjective:     Patient ID: Denise Craig, female   DOB: [birth year], 47 y.o.   MRN: [id number]  HPI  Patient is a 47 yo female patient with a history of migraines who presents today complaining of headaches. Patient states her headaches have gotten worse over the past month and says she has about 3-4 headaches a week, although they do not happen every week. She states they vary in length but can last up to a whole day. Patient says the headaches start behind her right eye and spread to her whole head. She describes them as a pounding feeling. She states light makes them worse and looking at a computer screen makes them worse as well. She has tried some OTC medications with no relief. She denies sinus pain, sinus congestion, or rhinorrhea. She denies neck pain or visual changes. She admits to some nausea with the headaches, but denies any vomiting. In the past she has taken topamax which did not work and made her feel funny and gabapentin which effected her concentration.   Patient is also going on a cruise next week and would like a patch to help with motion sickness.   She would like to continue to work on weight loss. H/o sleeve gastrectomy. Willing to try medications.   .. Active Ambulatory Problems    Diagnosis Date Noted  . THYROMEGALY 12/11/2007  . OBESITY, NOS 06/11/2006  . Iron deficiency anemia 05/16/2007  . ANEMIA, VITAMIN B12 DEFICIENCY 06/04/2007  . Anxiety state 06/11/2006  . Migraine headache 06/11/2006  . ASTHMA, PERSISTENT, MODERATE 05/16/2007  . HIATAL HERNIA 12/22/2007  . IBS 06/16/2009  . ENDOMETRIOSIS 06/11/2006  . INSOMNIA 07/15/2008  . Essential hypertension, benign 02/27/2011  . GERD (gastroesophageal reflux disease) 09/17/2012  . Nephrolithiasis 07/14/2013  . Periumbilical abdominal pain 01/25/2016  . 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 Anxiety and depression 06/01/2016  . Hot flashes 09/21/2016  . Family history of breast cancer 09/21/2016  . At  risk for impaired digestion 09/21/2016  . Menopause 09/24/2016  . Vitamin D deficiency 09/24/2016  . Low iron stores 09/24/2016  . Fidgeting 01/21/2018  . Chest tightness 01/21/2018  . No energy 01/21/2018  . Poor concentration 01/21/2018   Resolved Ambulatory Problems    Diagnosis Date Noted  . Leg edema 01/02/2011   Past Medical History:  Diagnosis Date  . Anxiety   . Asthma   . B12 deficiency   . Depression   . Infertility, female   . Iron deficiency   . Migraine   . PONV (postoperative nausea and vomiting)   . 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 Uterine polyp      Review of Systems  HENT: Negative for rhinorrhea, sinus pressure and sinus pain.   Eyes: Negative for visual disturbance.  Gastrointestinal: Positive for nausea. Negative for vomiting.  Musculoskeletal: Negative for neck pain.  Neurological: Positive for headaches.       Objective:   Physical Exam  Constitutional: She is oriented to person, place, and time. She appears well-developed and well-nourished.  HENT:  Head: Normocephalic and atraumatic.  Cardiovascular: Normal rate and regular rhythm.  Neurological: She is alert and oriented to person, place, and time.  Psychiatric: She has a normal mood and affect. Her behavior is normal.       Assessment:     Marland KitchenMarland KitchenDiagnoses and all orders for this visit:  Motion sickness, initial encounter -     scopolamine (TRANSDERM-SCOP, 1.5 MG,) 1 MG/3DAYS; Place 1 patch (1.5  mg total) onto the skin every 3 (three) days.  Class 1 obesity due to excess calories without serious comorbidity with body mass index (BMI) of 33.0 to 33.9 in adult -     Liraglutide -Weight Management (SAXENDA) 18 MG/3ML SOPN; Inject 0.6 mg into the skin daily. For one week then increase by .6mg  weekly until reaches 3mg  daily.  Please include ultra fine needles 43mm  Migraine without aura and without status migrainosus, not intractable -     SUMAtriptan (IMITREX) 100 MG tablet; Take 1 tablet  (100 mg total) by mouth every 2 (two) hours as needed for migraine. May repeat in 2 hours if headache persists or recurs. -     Galcanezumab-gnlm (EMGALITY) 120 MG/ML SOAJ; Inject 120 mg into the skin every 30 (thirty) days.       Plan:     Transdermal patches for motion sickness given.   Pt does not have a rescue. Given immitrex to start. Discussed side effects and how to take in response to migraine. Due to frequency she would be a great candidate for emgality. She has failed topamax and gabapentin the in past for migraine treatment. Discussed side effects and how to use. Coupon card given.   Marland Kitchen.Discussed low carb diet with 1500 calories and 80g of protein.  Exercising at least 150 minutes a week.  [employer] Pal could be a Microbiologist.  Started saxenda. Coupon card given. Discussed taper up to 3mg  and potential side effects. Follow up in 1 month.   Elevated BP today. Hx of HTN. Not on any medications today. If still elevated in 1 month need to discuss medicaitons. Consider lowering salt.   Marland Kitchen.Spent 30 minutes with patient and greater than 50 percent of visit spent counseling patient regarding treatment plan.  Marland KitchenVernetta Honey PA-C, have reviewed and agree with the above documentation in it's entirety.

## 2018-05-25 ENCOUNTER — Encounter: Payer: Self-pay | Admitting: Physician Assistant

## 2018-05-26 ENCOUNTER — Telehealth: Payer: Self-pay | Admitting: Physician Assistant

## 2018-05-26 NOTE — Addendum Note (Signed)
Addended by: Dessie Coma on: 05/26/2018 08:51 AM   Modules accepted: Orders

## 2018-05-26 NOTE — Telephone Encounter (Signed)
Received fax from [employer] that Emgality requires a PA. Information has been sent to the insurance company. Awaiting determination.   

## 2018-05-29 NOTE — Telephone Encounter (Signed)
Received fax from [facility] that patient will have to try and fail a beta blocker, topiraimate, or Amitriptyline before she can go on Emgality. PA does not give option to appeal. Please advise.

## 2018-05-29 NOTE — Telephone Encounter (Signed)
She has tried and failed topiramate. In HPI of note mader her feel funny.

## 2018-05-30 NOTE — Telephone Encounter (Signed)
Insurance is still not willing to pay for [employer]. Please advise.

## 2018-05-31 NOTE — Telephone Encounter (Signed)
Lets try aimovig 70mg  monthly [place] injection. 3 refills. Please call patient and give her new coupon card to use with printed rx.

## 2018-06-03 ENCOUNTER — Encounter: Payer: Self-pay | Admitting: Physician Assistant

## 2018-06-03 DIAGNOSIS — G43009 Migraine without aura, not intractable, without status migrainosus: Secondary | ICD-10-CM

## 2018-06-04 MED ORDER — GALCANEZUMAB-GNLM 120 MG/ML ~~LOC~~ SOAJ: 240.0000 mg | SUBCUTANEOUS | 0 refills | Status: DC

## 2018-06-15 ENCOUNTER — Other Ambulatory Visit: Payer: Self-pay | Admitting: Physician Assistant

## 2018-06-16 NOTE — Telephone Encounter (Signed)
Injections are not covered under patients plan. They are exclusions.

## 2018-07-08 LAB — HM MAMMOGRAPHY

## 2018-07-08 LAB — HM PAP SMEAR: HM Pap smear: NORMAL

## 2018-07-09 ENCOUNTER — Encounter: Payer: Self-pay | Admitting: Physician Assistant

## 2018-07-09 ENCOUNTER — Ambulatory Visit (INDEPENDENT_AMBULATORY_CARE_PROVIDER_SITE_OTHER): Payer: 59 | Admitting: Physician Assistant

## 2018-07-09 VITALS — BP 157/95 | HR 74 | Temp 98.0°F | Ht 66.0 in | Wt 214.0 lb

## 2018-07-09 DIAGNOSIS — R42 Dizziness and giddiness: Secondary | ICD-10-CM | POA: Diagnosis not present

## 2018-07-09 DIAGNOSIS — G43009 Migraine without aura, not intractable, without status migrainosus: Secondary | ICD-10-CM

## 2018-07-09 DIAGNOSIS — H6981 Other specified disorders of Eustachian tube, right ear: Secondary | ICD-10-CM | POA: Diagnosis not present

## 2018-07-09 DIAGNOSIS — I10 Essential (primary) hypertension: Secondary | ICD-10-CM

## 2018-07-09 MED ORDER — LISINOPRIL 10 MG PO TABS: 10.0000 mg | ORAL_TABLET | Freq: Every day | ORAL | 0 refills | Status: DC

## 2018-07-09 MED ORDER — METHYLPREDNISOLONE SODIUM SUCC 125 MG IJ SOLR
125.0000 mg | Freq: Once | INTRAMUSCULAR | Status: AC
Start: 2018-07-09 — End: 2018-07-09
  Administered 2018-07-09: 125 mg via INTRAMUSCULAR

## 2018-07-09 NOTE — Patient Instructions (Signed)
Start lisinopril 10mg  daily.  flonase 2 sprays each nostril.

## 2018-07-09 NOTE — Progress Notes (Signed)
Subjective:    Patient ID: Denise Craig, female    DOB: December 27, 1970, 47 y.o.   MRN: [id number]  HPI  Pt is a 47 yo female with hx of migraines who presents to the clinic with vertigo that started this morning when she woke up. She had some minor right ear pressure and then felt like she was leaning every where she walked. She does have a headache. No sinus pressure, nasal congestion, ST. Not tried anything to make better. Her vertigo is now gone. No n/v/d.   Hx of HTN. Not currently on medications. No CP, palpitations, vision changes.   Has migraines. On 2nd month of emgality. No significant decrease in migraines.   .. Active Ambulatory Problems    Diagnosis Date Noted  . THYROMEGALY 12/11/2007  . OBESITY, NOS 06/11/2006  . Iron deficiency anemia 05/16/2007  . ANEMIA, VITAMIN B12 DEFICIENCY 06/04/2007  . Anxiety state 06/11/2006  . Migraine headache 06/11/2006  . ASTHMA, PERSISTENT, MODERATE 05/16/2007  . HIATAL HERNIA 12/22/2007  . IBS 06/16/2009  . ENDOMETRIOSIS 06/11/2006  . INSOMNIA 07/15/2008  . Essential hypertension 02/27/2011  . GERD (gastroesophageal reflux disease) 09/17/2012  . Nephrolithiasis 07/14/2013  . Periumbilical abdominal pain 01/25/2016  . 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 Anxiety and depression 06/01/2016  . Hot flashes 09/21/2016  . Family history of breast cancer 09/21/2016  . At risk for impaired digestion 09/21/2016  . Menopause 09/24/2016  . Vitamin D deficiency 09/24/2016  . Low iron stores 09/24/2016  . Fidgeting 01/21/2018  . Chest tightness 01/21/2018  . No energy 01/21/2018  . Poor concentration 01/21/2018   Resolved Ambulatory Problems    Diagnosis Date Noted  . Leg edema 01/02/2011   Past Medical History:  Diagnosis Date  . Anxiety   . Asthma   . B12 deficiency   . Depression   . Infertility, female   . Iron deficiency   . Migraine   . PONV (postoperative nausea and vomiting)   . Status post gastrectomy 05/12/12   . Uterine polyp       Review of Systems See HPI.     Objective:   Physical Exam  Constitutional: She is oriented to person, place, and time. She appears well-developed and well-nourished.  HENT:  Right Ear: External ear normal.  Left Ear: External ear normal.  Nose: Nose normal.  Mouth/Throat: Oropharynx is clear and moist. No oropharyngeal exudate.  No sinus tenderness to palpation.  Right TM effusion.  Left TM clear.    Eyes: Conjunctivae are normal. Right eye exhibits no discharge. Left eye exhibits no discharge.  Cardiovascular: Normal rate and regular rhythm.  Pulmonary/Chest: Effort normal and breath sounds normal. She has no wheezes.  Lymphadenopathy:    She has no cervical adenopathy.  Neurological: She is alert and oriented to person, place, and time. Coordination normal.  Negative dix hallpike maneuvers.   Psychiatric: She has a normal mood and affect. Her behavior is normal.          Assessment & Plan:  .Diagnoses and all orders for this visit:  Vertigo  Dysfunction of right eustachian tube -     methylPREDNISolone sodium succinate (SOLU-MEDROL) 125 mg/2 mL injection 125 mg  Essential hypertension -     lisinopril (PRINIVIL,ZESTRIL) 10 MG tablet; Take 1 tablet (10 mg total) by mouth daily.  Migraine without aura and without status migrainosus, not intractable   Vertigo has resolved. Suspect ETD of right ear. Solumedrol given IM 125mg  today. Continue with  flonase 2 sprays each nostril.   BP elevated. Per pt elevated at GYN yesterday. Added lisinopril. Follow up in 1 month. Elevated BP could be contributing to HA's. Continue on emgality. Will assess HA status in one month.

## 2018-07-10 ENCOUNTER — Encounter: Payer: Self-pay | Admitting: Physician Assistant

## 2018-07-10 ENCOUNTER — Telehealth: Payer: Self-pay

## 2018-07-10 MED ORDER — AMOXICILLIN-POT CLAVULANATE 875-125 MG PO TABS: 1.0000 | ORAL_TABLET | Freq: Two times a day (BID) | ORAL | 0 refills | Status: DC

## 2018-07-10 NOTE — Telephone Encounter (Signed)
I continue to suspect this is likely viral. I sent over augmentin that could start in another 24 hours. Delsym for cough. reast and hydration

## 2018-07-10 NOTE — Telephone Encounter (Signed)
Denise Craig called this morning saying that she is still not feeling any better even after getting the Solu-medrol shot yesterday here in the office. She states she is coughing very bad and she keeps losing her voice as well. She didn't know if there was anything else she could do as she will be traveling out of town next weekend and is really wanting to feel better. Thanks!

## 2018-07-10 NOTE — Telephone Encounter (Signed)
See note below. It did not route the first time. I sent augmentin but likely still viral. deslym for cough. Rest and hydrate.

## 2018-07-11 NOTE — Telephone Encounter (Signed)
Called and left a voicemail notifying patient of augmentin being sent to pharmacy, and also reassuring her to get lots of rest and stay hydrated. Call back information was provided if any further questions or concerns.

## 2018-07-12 IMAGING — DX DG ABDOMEN 1V
1 series · 1 of 1 positions shown · non-contrast
Comparison: Abdominal radiograph from upper GI 6827.

CLINICAL DATA: Left flank pain upon waking today.

EXAM:
ABDOMEN - 1 VIEW

[abdomen kub]
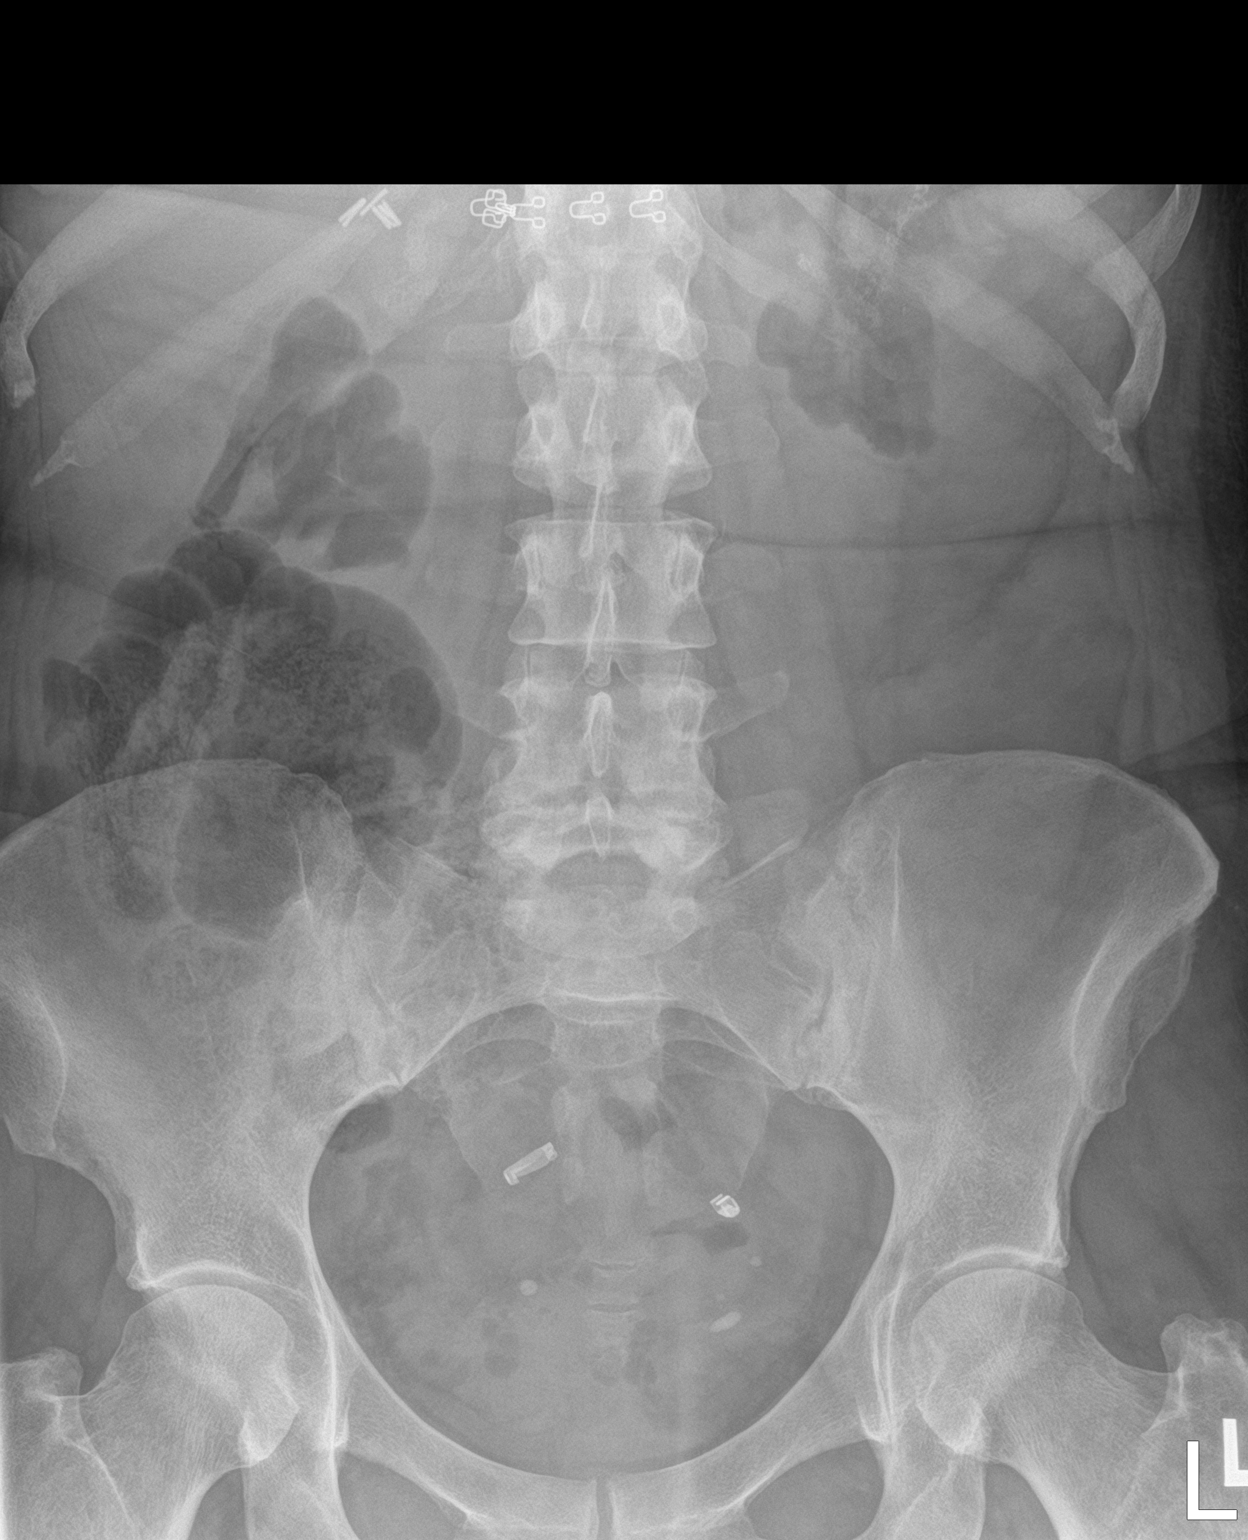

[1 of 1 positions shown; findings below may reference images not displayed]

FINDINGS: Bowel gas pattern is normal. Previous cholecystectomy. Previous
surgery in the region of the stomach. Previous tubal ligation. No
calcifications overlying the location of the kidneys. Numerous
calcifications in the pelvis which are indeterminate for phleboliths
versus ureteral calculi. Lower lumbar degenerative changes
incidentally noted.
IMPRESSION: Multiple calcifications in the pelvis, indeterminate for phleboliths
versus ureteral calculi. Particularly, an ovoid density on the left
was not demonstrated on the previous abdominal radiograph, though
the lower pelvis was not definitely completely included.

## 2018-07-15 ENCOUNTER — Other Ambulatory Visit: Payer: Self-pay | Admitting: Physician Assistant

## 2018-07-15 DIAGNOSIS — F419 Anxiety disorder, unspecified: Principal | ICD-10-CM

## 2018-07-15 DIAGNOSIS — F32A Depression, unspecified: Secondary | ICD-10-CM

## 2018-07-15 DIAGNOSIS — F329 Major depressive disorder, single episode, unspecified: Secondary | ICD-10-CM

## 2018-08-05 ENCOUNTER — Telehealth: Payer: Self-pay | Admitting: Physician Assistant

## 2018-08-05 ENCOUNTER — Encounter: Payer: Self-pay | Admitting: Physician Assistant

## 2018-08-05 ENCOUNTER — Ambulatory Visit (INDEPENDENT_AMBULATORY_CARE_PROVIDER_SITE_OTHER): Payer: 59 | Admitting: Physician Assistant

## 2018-08-05 VITALS — BP 159/85 | HR 71 | Ht 66.0 in | Wt 210.0 lb

## 2018-08-05 DIAGNOSIS — G43009 Migraine without aura, not intractable, without status migrainosus: Secondary | ICD-10-CM | POA: Diagnosis not present

## 2018-08-05 DIAGNOSIS — Z79899 Other long term (current) drug therapy: Secondary | ICD-10-CM | POA: Diagnosis not present

## 2018-08-05 DIAGNOSIS — I10 Essential (primary) hypertension: Secondary | ICD-10-CM | POA: Diagnosis not present

## 2018-08-05 MED ORDER — LISINOPRIL-HYDROCHLOROTHIAZIDE 20-12.5 MG PO TABS: 1.0000 | ORAL_TABLET | Freq: Every day | ORAL | 3 refills | Status: DC

## 2018-08-05 MED ORDER — RIZATRIPTAN BENZOATE 10 MG PO TABS
10.0000 mg | ORAL_TABLET | ORAL | 0 refills | Status: DC | PRN
Start: 2018-08-05 — End: 2021-02-24

## 2018-08-05 MED ORDER — TOPIRAMATE ER 50 MG PO CAP24: 1.0000 | ORAL_CAPSULE | Freq: Every day | ORAL | 2 refills | Status: DC

## 2018-08-05 NOTE — Telephone Encounter (Signed)
Received fax from [employer] that Trok[employer] requires a PA. Information has been sent to the insurance company. Awaiting determination.

## 2018-08-05 NOTE — Progress Notes (Signed)
   Subjective:    Patient ID: Denise Craig, female    DOB: 28-Jul-1971, 47 y.o.   MRN: [id number]  HPI  Pt is a 47 yo female with HTN and migraines who presents to the clinic to follow up in 1 month.   HTN- no CP, palpitations, vision changes. Lisinopril was started. No problems. Not checking BP.   Migraines- she has noticed improvement with monthly emgality. She has been on for the last 3 months. Headaches are down to 3 a week. imitrex works to stop headache but makes her chest feel heavy. She has tried topamax before but seemed to make her feel like she could not think as well.   .. Active Ambulatory Problems    Diagnosis Date Noted  . THYROMEGALY 12/11/2007  . OBESITY, NOS 06/11/2006  . Iron deficiency anemia 05/16/2007  . ANEMIA, VITAMIN B12 DEFICIENCY 06/04/2007  . Anxiety state 06/11/2006  . Migraine headache 06/11/2006  . ASTHMA, PERSISTENT, MODERATE 05/16/2007  . HIATAL HERNIA 12/22/2007  . IBS 06/16/2009  . ENDOMETRIOSIS 06/11/2006  . INSOMNIA 07/15/2008  . Essential hypertension 02/27/2011  . GERD (gastroesophageal reflux disease) 09/17/2012  . Nephrolithiasis 07/14/2013  . Periumbilical abdominal pain 01/25/2016  . 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 Anxiety and depression 06/01/2016  . Hot flashes 09/21/2016  . Family history of breast cancer 09/21/2016  . At risk for impaired digestion 09/21/2016  . Menopause 09/24/2016  . Vitamin D deficiency 09/24/2016  . Low iron stores 09/24/2016  . Fidgeting 01/21/2018  . Chest tightness 01/21/2018  . No energy 01/21/2018  . Poor concentration 01/21/2018   Resolved Ambulatory Problems    Diagnosis Date Noted  . Leg edema 01/02/2011   Past Medical History:  Diagnosis Date  . Anxiety   . Asthma   . B12 deficiency   . Depression   . Infertility, female   . Iron deficiency   . Migraine   . PONV (postoperative nausea and vomiting)   . 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 Uterine polyp       Review of  Systems See HPI    Objective:   Physical Exam  Constitutional: She is oriented to person, place, and time. She appears well-developed and well-nourished.  HENT:  Head: Normocephalic and atraumatic.  Cardiovascular: Normal rate and regular rhythm.  Pulmonary/Chest: Effort normal and breath sounds normal.  Neurological: She is alert and oriented to person, place, and time.  Psychiatric: She has a normal mood and affect. Her behavior is normal.          Assessment & Plan:  Marland KitchenMarland KitchenDiagnoses and all orders for this visit:  Essential hypertension -     lisinopril-hydrochlorothiazide (ZESTORETIC) 20-12.5 MG tablet; Take 1 tablet by mouth daily.  Medication management -     BASIC METABOLIC PANEL WITH GFR  Migraine without aura and without status migrainosus, not intractable -     rizatriptan (MAXALT) 10 MG tablet; Take 1 tablet (10 mg total) by mouth as needed for migraine. May repeat in 2 hours if needed -     Topiramate ER (TROKENDI XR) 50 MG CP24; Take 1 tablet by mouth daily.   Increased BP medication to lisinopril with HCTZ. Recheck labs in 1 month.   Switch to rescue of maxalt. Added topamax ER to migraine prevention. Follow up in 2 months. Hopefully side effects will be less. Given at least 2 weeks.

## 2018-08-05 NOTE — Patient Instructions (Signed)
Stop lisinopril.  Start lisinopril/HCTZ. Start Trokendi.

## 2018-08-06 NOTE — Telephone Encounter (Signed)
Received a fax from [employer] that Trok[employer] is a plan exclusion for this patient. It does not give an option to appeal.

## 2018-08-12 ENCOUNTER — Encounter: Payer: Self-pay | Admitting: Physician Assistant

## 2018-08-12 DIAGNOSIS — G43009 Migraine without aura, not intractable, without status migrainosus: Secondary | ICD-10-CM

## 2018-08-12 MED ORDER — TOPIRAMATE 50 MG PO TABS: ORAL_TABLET | ORAL | 1 refills | Status: DC

## 2018-08-15 ENCOUNTER — Encounter: Payer: Self-pay | Admitting: Physician Assistant

## 2018-08-15 DIAGNOSIS — F419 Anxiety disorder, unspecified: Principal | ICD-10-CM

## 2018-08-15 DIAGNOSIS — F32A Depression, unspecified: Secondary | ICD-10-CM

## 2018-08-15 DIAGNOSIS — F329 Major depressive disorder, single episode, unspecified: Secondary | ICD-10-CM

## 2018-08-15 DIAGNOSIS — F5101 Primary insomnia: Secondary | ICD-10-CM

## 2018-08-15 MED ORDER — TRAZODONE HCL 50 MG PO TABS: ORAL_TABLET | ORAL | 1 refills | Status: DC

## 2018-08-15 MED ORDER — BUSPIRONE HCL 10 MG PO TABS: 10.0000 mg | ORAL_TABLET | Freq: Two times a day (BID) | ORAL | 1 refills | Status: DC

## 2018-08-15 MED ORDER — DEXLANSOPRAZOLE 60 MG PO CPDR: 1.0000 | DELAYED_RELEASE_CAPSULE | Freq: Every day | ORAL | 1 refills | Status: DC

## 2018-08-15 MED ORDER — MONTELUKAST SODIUM 10 MG PO TABS: 10.0000 mg | ORAL_TABLET | Freq: Every day | ORAL | 1 refills | Status: DC

## 2018-08-18 ENCOUNTER — Other Ambulatory Visit: Payer: Self-pay

## 2018-08-18 DIAGNOSIS — F32A Depression, unspecified: Secondary | ICD-10-CM

## 2018-08-18 DIAGNOSIS — F5101 Primary insomnia: Secondary | ICD-10-CM

## 2018-08-18 DIAGNOSIS — F419 Anxiety disorder, unspecified: Principal | ICD-10-CM

## 2018-08-18 DIAGNOSIS — F329 Major depressive disorder, single episode, unspecified: Secondary | ICD-10-CM

## 2018-08-18 MED ORDER — TRAZODONE HCL 50 MG PO TABS: ORAL_TABLET | ORAL | 1 refills | Status: DC

## 2018-08-18 MED ORDER — DEXLANSOPRAZOLE 60 MG PO CPDR: 1.0000 | DELAYED_RELEASE_CAPSULE | Freq: Every day | ORAL | 1 refills | Status: DC

## 2018-08-18 MED ORDER — BUSPIRONE HCL 10 MG PO TABS: 10.0000 mg | ORAL_TABLET | Freq: Two times a day (BID) | ORAL | 1 refills | Status: DC

## 2018-08-21 ENCOUNTER — Telehealth: Payer: Self-pay

## 2018-08-21 DIAGNOSIS — I10 Essential (primary) hypertension: Secondary | ICD-10-CM

## 2018-08-21 MED ORDER — LISINOPRIL-HYDROCHLOROTHIAZIDE 20-12.5 MG PO TABS: 1.0000 | ORAL_TABLET | Freq: Every day | ORAL | 3 refills | Status: DC

## 2018-08-21 NOTE — Telephone Encounter (Signed)
Requesting RX for BP meds be sent to [facility]

## 2018-09-03 ENCOUNTER — Other Ambulatory Visit: Payer: Self-pay | Admitting: Physician Assistant

## 2018-09-03 DIAGNOSIS — G43009 Migraine without aura, not intractable, without status migrainosus: Secondary | ICD-10-CM

## 2018-10-13 LAB — HM COLONOSCOPY

## 2018-10-21 ENCOUNTER — Other Ambulatory Visit: Payer: Self-pay | Admitting: Physician Assistant

## 2018-10-21 DIAGNOSIS — F329 Major depressive disorder, single episode, unspecified: Secondary | ICD-10-CM

## 2018-10-21 DIAGNOSIS — F32A Depression, unspecified: Secondary | ICD-10-CM

## 2018-10-21 DIAGNOSIS — F419 Anxiety disorder, unspecified: Principal | ICD-10-CM

## 2018-11-24 ENCOUNTER — Other Ambulatory Visit: Payer: Self-pay | Admitting: Physician Assistant

## 2018-11-24 DIAGNOSIS — G43009 Migraine without aura, not intractable, without status migrainosus: Secondary | ICD-10-CM

## 2019-01-06 ENCOUNTER — Other Ambulatory Visit: Payer: Self-pay | Admitting: Physician Assistant

## 2019-01-06 DIAGNOSIS — F329 Major depressive disorder, single episode, unspecified: Secondary | ICD-10-CM

## 2019-01-06 DIAGNOSIS — F32A Depression, unspecified: Secondary | ICD-10-CM

## 2019-01-06 DIAGNOSIS — G43009 Migraine without aura, not intractable, without status migrainosus: Secondary | ICD-10-CM

## 2019-01-06 DIAGNOSIS — F419 Anxiety disorder, unspecified: Secondary | ICD-10-CM

## 2019-01-07 NOTE — Telephone Encounter (Signed)
Please contact patient to make virtual or office appt with [facility] for refills. Thanks!

## 2019-01-07 NOTE — Telephone Encounter (Signed)
Left pt a Vm to schedule a F/u on meds appt with Luvenia Starch so she can get refills

## 2019-01-14 ENCOUNTER — Telehealth (INDEPENDENT_AMBULATORY_CARE_PROVIDER_SITE_OTHER): Payer: 59 | Admitting: Physician Assistant

## 2019-01-14 VITALS — BP 122/78 | Ht 66.0 in | Wt 211.0 lb

## 2019-01-14 DIAGNOSIS — F329 Major depressive disorder, single episode, unspecified: Secondary | ICD-10-CM | POA: Diagnosis not present

## 2019-01-14 DIAGNOSIS — G43009 Migraine without aura, not intractable, without status migrainosus: Secondary | ICD-10-CM | POA: Diagnosis not present

## 2019-01-14 DIAGNOSIS — F32A Depression, unspecified: Secondary | ICD-10-CM

## 2019-01-14 DIAGNOSIS — F419 Anxiety disorder, unspecified: Secondary | ICD-10-CM

## 2019-01-14 MED ORDER — GALCANEZUMAB-GNLM 120 MG/ML ~~LOC~~ SOAJ: 1.0000 mL | SUBCUTANEOUS | 5 refills | Status: DC

## 2019-01-14 MED ORDER — BUSPIRONE HCL 10 MG PO TABS: 10.0000 mg | ORAL_TABLET | Freq: Two times a day (BID) | ORAL | 1 refills | Status: DC

## 2019-01-14 MED ORDER — VORTIOXETINE HBR 20 MG PO TABS: 20.0000 mg | ORAL_TABLET | Freq: Every day | ORAL | 1 refills | Status: DC

## 2019-01-14 NOTE — Progress Notes (Signed)
Patient ID: Denise Craig, female   DOB: [birth year], 48 y.o.   MRN: [id number] .Marland KitchenVirtual Visit via Video Note  I connected with Denise Craig on 01/14/19 at 10:30 AM EDT by a video enabled telemedicine application and verified that I am speaking with the correct person using two identifiers.  Location: Patient: home Provider: clinic   I discussed the limitations of evaluation and management by telemedicine and the availability of in person appointments. The patient expressed understanding and agreed to proceed.  History of Present Illness: Pt is a 48 yo female with migraines, anxiety, depression who calls in to discuss refills.   She has been out of emgality for 2 weeks and having many more migraines. She noticed a decrease while on emgality but still would have 2 around time for next dose.   Anxiety and depression is controlled. She is doing "pretty good". 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 No SI/HC.   Marland Kitchen. Active Ambulatory Problems    Diagnosis Date Noted  . THYROMEGALY 12/11/2007  . OBESITY, NOS 06/11/2006  . Iron deficiency anemia 05/16/2007  . ANEMIA, VITAMIN B12 DEFICIENCY 06/04/2007  . Anxiety state 06/11/2006  . Migraine headache 06/11/2006  . ASTHMA, PERSISTENT, MODERATE 05/16/2007  . HIATAL HERNIA 12/22/2007  . IBS 06/16/2009  . ENDOMETRIOSIS 06/11/2006  . INSOMNIA 07/15/2008  . Essential hypertension 02/27/2011  . GERD (gastroesophageal reflux disease) 09/17/2012  . Nephrolithiasis 07/14/2013  . Periumbilical abdominal pain 01/25/2016  . 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 Anxiety and depression 06/01/2016  . Hot flashes 09/21/2016  . Family history of breast cancer 09/21/2016  . At risk for impaired digestion 09/21/2016  . Menopause 09/24/2016  . Vitamin D deficiency 09/24/2016  . Low iron stores 09/24/2016  . Fidgeting 01/21/2018  . Chest tightness 01/21/2018  . No energy 01/21/2018  . Poor concentration  01/21/2018   Resolved Ambulatory Problems    Diagnosis Date Noted  . Leg edema 01/02/2011   Past Medical History:  Diagnosis Date  . Anxiety   . Asthma   . B12 deficiency   . Depression   . Infertility, female   . Iron deficiency   . Migraine   . PONV (postoperative nausea and vomiting)   . 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 Uterine polyp    Reviewed med, allergy, problem list.     Observations/Objective: No acute distress. Normal mood.  Normal breathing.  Normal appearance.   .. Today's Vitals   01/14/19 0912  BP: 122/78  Weight: 211 lb (95.7 kg)  Height: 5\' 6"  (1.676 m)   Body mass index is 34.06 kg/m.  .. Depression screen [facility] 2/9 01/14/2019 08/05/2018 03/23/2018 01/21/2018 12/28/2016  Decreased Interest 0 2 1 1  0  Down, Depressed, Hopeless 1 1 0 2 1  PHQ - 2 Score 1 3 1 3 1   Altered sleeping 2 1 3 3 2   Tired, decreased energy 2 2 3 3 2   Change in appetite 0 1 0 2 2  Feeling bad or failure about yourself  0 1 0 0 1  Trouble concentrating 1 1 0 1 1  Moving slowly or fidgety/restless 1 1 0 1 0  Suicidal thoughts 0 0 0 0 0  PHQ-9 Score 7 10 7 13 9   Difficult doing work/chores Somewhat difficult Somewhat difficult Not difficult at all Somewhat difficult -   .. GAD 7 : Generalized Anxiety Score 01/14/2019 08/05/2018 03/23/2018 01/21/2018  Nervous, Anxious, on Edge  1 2 2 2   Control/stop worrying 2 1 2 1   Worry too much - different things 2 1 2 1   Trouble relaxing 2 1 1 2   Restless 1 2 1 2   Easily annoyed or irritable 3 2 1 2   Afraid - awful might happen 0 0 1 0  Total GAD 7 Score 11 9 10 10   Anxiety Difficulty Somewhat difficult Somewhat difficult Somewhat difficult Somewhat difficult      Assessment and Plan:  Marland KitchenMarland KitchenDiagnoses and all orders for this visit:  Migraine without aura and without status migrainosus, not intractable -     Galcanezumab-gnlm (EMGALITY) 120 MG/ML SOAJ; Inject 1 mL as directed every 30 (thirty) days.  Anxiety and depression -      busPIRone (BUSPAR) 10 MG tablet; Take 1 tablet (10 mg total) by mouth 2 (two) times daily. -     vortioxetine HBr (TRINTELLIX) 20 MG TABS tablet; Take 1 tablet (20 mg total) by mouth daily.   Refilled medications today. She has been out of emgality so migraines have increased. She did report increase of migraines toward the end of the month with emgality. We could consider Ajovy which has a slightly longer half life or seeing if insurance would pay for emgality twice a month.   Follow up in 6 months.   Follow Up Instructions:    I discussed the assessment and treatment plan with the patient. The patient was provided an opportunity to ask questions and all were answered. The patient agreed with the plan and demonstrated an understanding of the instructions.   The patient was advised to call back or seek an in-person evaluation if the symptoms worsen or if the condition fails to improve as anticipated.     Iran Planas, PA-C

## 2019-01-14 NOTE — Progress Notes (Deleted)
Patient doing well. Following up for refills. PHQ9-GAD7 completed. She will check BP and report during visit.

## 2019-01-21 ENCOUNTER — Encounter: Payer: Self-pay | Admitting: Physician Assistant

## 2019-02-15 ENCOUNTER — Encounter: Payer: Self-pay | Admitting: Physician Assistant

## 2019-03-18 ENCOUNTER — Telehealth: Payer: Self-pay | Admitting: Physician Assistant

## 2019-03-18 DIAGNOSIS — G43009 Migraine without aura, not intractable, without status migrainosus: Secondary | ICD-10-CM

## 2019-03-18 MED ORDER — EMGALITY 120 MG/ML ~~LOC~~ SOAJ: 1.0000 mL | SUBCUTANEOUS | 5 refills | Status: DC

## 2019-03-18 NOTE — Telephone Encounter (Signed)
Needed refil.

## 2019-04-09 ENCOUNTER — Other Ambulatory Visit: Payer: Self-pay

## 2019-04-09 ENCOUNTER — Encounter: Payer: Self-pay | Admitting: Family Medicine

## 2019-04-09 ENCOUNTER — Emergency Department (INDEPENDENT_AMBULATORY_CARE_PROVIDER_SITE_OTHER)
Admission: EM | Admit: 2019-04-09 | Discharge: 2019-04-09 | Disposition: A | Discharge status: Home / Self Care | Payer: Worker's Compensation | Source: Home / Self Care

## 2019-04-09 DIAGNOSIS — W540XXA Bitten by dog, initial encounter: Secondary | ICD-10-CM

## 2019-04-09 DIAGNOSIS — S61411A Laceration without foreign body of right hand, initial encounter: Secondary | ICD-10-CM

## 2019-04-09 MED ORDER — HYDROCODONE-ACETAMINOPHEN 5-325 MG PO TABS: 1.0000 | ORAL_TABLET | Freq: Four times a day (QID) | ORAL | 0 refills | Status: DC | PRN

## 2019-04-09 MED ORDER — CEPHALEXIN 500 MG PO CAPS: 500.0000 mg | ORAL_CAPSULE | Freq: Two times a day (BID) | ORAL | 0 refills | Status: DC

## 2019-04-09 NOTE — Discharge Instructions (Addendum)
Keep dry today.  Normal bathing starting tomorrow.  Keep covered at work  Sutures out in 7 days

## 2019-04-09 NOTE — ED Triage Notes (Signed)
Pt was bitten by a dog at work this am.  2 puncture wounds on right palm

## 2019-04-09 NOTE — ED Provider Notes (Signed)
Denise Craig [facility]    CSN: [id number] Arrival date & time: 04/09/19  1331     History   Chief Complaint Chief Complaint  Patient presents with  . Animal Bite    HPI Denise Craig is a 48 y.o. female.   [facility] patient presents with dog bite.  Tdap was given 2013.      Past Medical History:  Diagnosis Date  . Anxiety   . Asthma    has not used inhaler in past 10 months-will bring dos  . B12 deficiency   . Depression   . GERD (gastroesophageal reflux disease)    w/ moderate hiatal hernia (EGD 3-09), Dr August Saucer prn, prilosec  . Infertility, female   . Iron deficiency   . Migraine    sleep problems- sees Dr Berdine Addison, EEG normal 12-07  . Nephrolithiasis   . PONV (postoperative nausea and vomiting)   . 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 Uterine polyp     Patient Active Problem List   Diagnosis Date Noted  . Fidgeting 01/21/2018  . Chest tightness 01/21/2018  . No energy 01/21/2018  . Poor concentration 01/21/2018  . Menopause 09/24/2016  . Vitamin D deficiency 09/24/2016  . Low iron stores 09/24/2016  . Hot flashes 09/21/2016  . Family history of breast cancer 09/21/2016  . At risk for impaired digestion 09/21/2016  . 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 Anxiety and depression 06/01/2016  . Periumbilical abdominal pain 01/25/2016  . Nephrolithiasis 07/14/2013  . GERD (gastroesophageal reflux disease) 09/17/2012  . Essential hypertension 02/27/2011  . IBS 06/16/2009  . INSOMNIA 07/15/2008  . HIATAL HERNIA 12/22/2007  . THYROMEGALY 12/11/2007  . ANEMIA, VITAMIN B12 DEFICIENCY 06/04/2007  . Iron deficiency anemia 05/16/2007  . ASTHMA, PERSISTENT, MODERATE 05/16/2007  . OBESITY, NOS 06/11/2006  . Anxiety state 06/11/2006  . Migraine headache 06/11/2006  . ENDOMETRIOSIS 06/11/2006    Past Surgical History:  Procedure Laterality Date  . APPENDECTOMY    . endometrial ablation spring  2008  . GASTRIC BYPASS   2013  . HYSTEROSCOPY WITH NOVASURE  08/12/2012   Procedure: HYSTEROSCOPY WITH NOVASURE;  Surgeon: Marylynn Pearson, MD;  Location: [facility];  Service: Gynecology;  Laterality: N/A;  . LAPAROSCOPIC TUBAL LIGATION  08/12/2012   Procedure: LAPAROSCOPIC TUBAL LIGATION;  Surgeon: Marylynn Pearson, MD;  Location: [facility];  Service: Gynecology;  Laterality: Bilateral;  WITH FILSHIE CLIPS  . right knee lateral release surgery  2011  . TONSILLECTOMY    . tumor removed  2012   removed right ankle    OB History   No obstetric history on file.      Home Medications    Prior to Admission medications   Medication Sig Start Date End Date Taking? Authorizing Provider  ALPRAZolam (XANAX) 0.5 MG tablet TAKE 1 TABLET(0.5 MG) BY MOUTH AT BEDTIME AS NEEDED FOR ANXIETY 07/16/18   Breeback, Jade L, PA-C  busPIRone (BUSPAR) 10 MG tablet Take 1 tablet (10 mg total) by mouth 2 (two) times daily. 01/14/19   Breeback, Jade L, PA-C  cephALEXin (KEFLEX) 500 MG capsule Take 1 capsule (500 mg total) by mouth 2 (two) times daily. 04/09/19   Robyn Haber, MD  Galcanezumab-gnlm (EMGALITY) 120 MG/ML SOAJ Inject 1 mL as directed every 30 (thirty) days. 03/18/19   Breeback, Royetta Car, PA-C  HYDROcodone-acetaminophen (NORCO) 5-325 MG tablet Take 1 tablet by mouth every 6 (six) hours as needed for moderate pain. 04/09/19   Robyn Haber,  MD  lisinopril-hydrochlorothiazide (ZESTORETIC) 20-12.5 MG tablet Take 1 tablet by mouth daily. 08/21/18   Breeback, Jade L, PA-C  montelukast (SINGULAIR) 10 MG tablet Take 1 tablet (10 mg total) by mouth at bedtime. 08/15/18   Breeback, Royetta Car, PA-C  Multiple Vitamins-Minerals (MULTIVITAMIN WITH MINERALS) tablet Take 1 tablet by mouth daily.     [provider]  ondansetron (ZOFRAN) 8 MG tablet Take 1 tablet (8 mg total) by mouth every 8 (eight) hours as needed for nausea or vomiting. 04/07/18   Daleen Bo, MD  pantoprazole (PROTONIX) 40 MG tablet Take 40 mg by mouth 2 (two) times daily.  02/13/18   [provider]  rizatriptan (MAXALT) 10 MG tablet Take 1 tablet (10 mg total) by mouth as needed for migraine. May repeat in 2 hours if needed 08/05/18   Iran Planas L, PA-C  traZODone (DESYREL) 50 MG tablet Take one to three tablets and bedtime. 08/18/18   Breeback, Royetta Car, PA-C  vortioxetine HBr (TRINTELLIX) 20 MG TABS tablet Take 1 tablet (20 mg total) by mouth daily. 01/14/19   Donella Stade, PA-C    Family History Family History  Problem Relation Age of Onset  . Cancer Father        metastatic, colon ca  . Hypertension Mother   . Diabetes Mother   . Breast cancer Mother   . Cancer Mother 16       breast cancer  . Breast cancer Maternal Aunt   . Breast cancer Paternal Aunt   . Breast cancer Maternal Grandmother   . Breast cancer Paternal Grandmother     Social History Social History   Tobacco Use  . Smoking status: Never Smoker  . Smokeless tobacco: Never Used  Substance Use Topics  . Alcohol use: No  . Drug use: No     Allergies   Gabapentin, Morphine and related, and Prednisone   Review of Systems Review of Systems   Physical Exam Triage Vital Signs ED Triage Vitals  Enc Vitals Group     BP      Pulse      Resp      Temp      Temp src      SpO2      Weight      Height      Head Circumference      Peak Flow      Pain Score      Pain Loc      Pain Edu?      Excl. in [place]?    No data found.  Updated Vital Signs BP 110/73 (BP Location: Right Arm)   Pulse 88   Temp 98.1 F (36.7 C) (Oral)   Resp 20   Ht 5\' 6"  (1.676 m)   Wt 96.6 kg   SpO2 98%   BMI 34.38 kg/m    Physical Exam Vitals signs and nursing note reviewed.  Constitutional:      Appearance: Normal appearance.  Pulmonary:     Effort: Pulmonary effort is normal.  Musculoskeletal: Normal range of motion.  Skin:    General: Skin is warm.     Findings: Lesion present.  Neurological:     General: No focal deficit present.     Mental Status: She is alert.   Psychiatric:        Mood and Affect: Mood normal.        UC Treatments / Results  Labs (all labs ordered are listed, but only  abnormal results are displayed) Labs Reviewed - No data to display  EKG   Radiology No results found.  Procedures Laceration Repair  Date/Time: 04/09/2019 2:24 PM Performed by: Robyn Haber, MD Authorized by: Robyn Haber, MD   Consent:    Consent obtained:  Verbal   Consent given by:  Patient   Risks discussed:  Infection and pain   Alternatives discussed:  No treatment Anesthesia (see MAR for exact dosages):    Anesthesia method:  Local infiltration   Local anesthetic:  Lidocaine 1% WITH epi Laceration details:    Location:  Hand   Hand location:  R palm Repair type:    Repair type:  Simple Pre-procedure details:    Preparation:  Patient was prepped and draped in usual sterile fashion Exploration:    Wound extent: no muscle damage noted     Contaminated: yes   Treatment:    Area cleansed with:  Betadine, Hibiclens and soap and water   Amount of cleaning:  Extensive   Irrigation solution:  Tap water   Irrigation method:  Tap   Visualized foreign bodies/material removed: no   Skin repair:    Repair method:  Sutures   Suture size:  4-0   Suture material:  Nylon   Suture technique:  Horizontal mattress   Number of sutures:  2 Approximation:    Approximation:  Close Post-procedure details:    Dressing:  Non-adherent dressing   Patient tolerance of procedure:  Tolerated well, no immediate complications   (including critical care time)  Medications Ordered in UC Medications - No data to display  Initial Impression / Assessment and Plan / UC Course  I have reviewed the triage vital signs and the nursing notes.  Pertinent labs & imaging results that were available during my care of the patient were reviewed by me and considered in my medical decision making (see chart for details).    Final Clinical Impressions(s) / UC  Diagnoses   Final diagnoses:  Dog bite, initial encounter  Laceration of right hand without foreign body, initial encounter     Discharge Instructions     Keep dry today.  Normal bathing starting tomorrow.  Keep covered at work  Sutures out in 7 days    ED Prescriptions    Medication [place]. Provider   cephALEXin (KEFLEX) 500 MG capsule Take 1 capsule (500 mg total) by mouth 2 (two) times daily. 10 capsule Robyn Haber, MD   HYDROcodone-acetaminophen (NORCO) 5-325 MG tablet Take 1 tablet by mouth every 6 (six) hours as needed for moderate pain. 12 tablet Robyn Haber, MD        Robyn Haber, MD 04/09/19 1426

## 2019-04-16 ENCOUNTER — Other Ambulatory Visit: Payer: Self-pay

## 2019-04-16 ENCOUNTER — Emergency Department
Admission: EM | Admit: 2019-04-16 | Discharge: 2019-04-16 | Disposition: A | Discharge status: Home / Self Care | Payer: 59 | Source: Home / Self Care

## 2019-04-16 DIAGNOSIS — Z4802 Encounter for removal of sutures: Secondary | ICD-10-CM

## 2019-04-16 DIAGNOSIS — R112 Nausea with vomiting, unspecified: Secondary | ICD-10-CM

## 2019-04-16 MED ORDER — PROMETHAZINE HCL 25 MG PO TABS: 25.0000 mg | ORAL_TABLET | Freq: Four times a day (QID) | ORAL | 0 refills | Status: DC | PRN

## 2019-04-16 NOTE — ED Triage Notes (Signed)
Started Tuesday night with nausea and vomited x 1.  Vomited about 10 times yesterday.  Still having nausea today.

## 2019-04-16 NOTE — ED Notes (Signed)
Removed 2 sutures from 2 puncture wounds in palm of right hand.  Removed without incident

## 2019-04-16 NOTE — ED Provider Notes (Signed)
Denise Craig CARE    CSN: [id number] Arrival date & time: 04/16/19  1026     History   Chief Complaint Chief Complaint  Patient presents with  . Nausea  . Emesis    HPI Denise Craig is a 48 y.o. female.   HPI Denise Craig is a 48 y.o. female presenting to UC with c/o nausea and vomiting that started 2 nights ago with one episode of vomiting.  Yesterday she had about 10 episodes of vomiting. She took zofran w/o relief. Today she has nausea but no vomiting. Denies diarrhea. Denies fever, chills, body aches, cough, congestion, HA, dizziness or sore throat.  No known sick contacts or recent travel.   She was seen at [facility] on 04/09/2019 for a dog bite to her Right hand. She had sutures placed and states her wounds seem to be healing well, no pain, redness, or drainage. She is taking Keflex as prescribed. She has had Keflex in the past w/o stomach upset in the past.   She does recall being stung by 3 or 4 yellow jackets the day before symptoms started. Denies known allergy to bee or wasp venom.  She has one tiny red puncture where she had to remove a stinger but otherwise no redness or swelling in the sting areas. No hives.    Past Medical History:  Diagnosis Date  . Anxiety   . Asthma    has not used inhaler in past 10 months-will bring dos  . B12 deficiency   . Depression   . GERD (gastroesophageal reflux disease)    w/ moderate hiatal hernia (EGD 3-09), Dr August Saucer prn, prilosec  . Infertility, female   . Iron deficiency   . Migraine    sleep problems- sees Dr Berdine Addison, EEG normal 12-07  . Nephrolithiasis   . PONV (postoperative nausea and vomiting)   . 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 Uterine polyp     Patient Active Problem List   Diagnosis Date Noted  . Fidgeting 01/21/2018  . Chest tightness 01/21/2018  . No energy 01/21/2018  . Poor concentration 01/21/2018  . Menopause 09/24/2016  . Vitamin D deficiency 09/24/2016  . Low iron stores  09/24/2016  . Hot flashes 09/21/2016  . Family history of breast cancer 09/21/2016  . At risk for impaired digestion 09/21/2016  . 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 Anxiety and depression 06/01/2016  . Periumbilical abdominal pain 01/25/2016  . Nephrolithiasis 07/14/2013  . GERD (gastroesophageal reflux disease) 09/17/2012  . Essential hypertension 02/27/2011  . IBS 06/16/2009  . INSOMNIA 07/15/2008  . HIATAL HERNIA 12/22/2007  . THYROMEGALY 12/11/2007  . ANEMIA, VITAMIN B12 DEFICIENCY 06/04/2007  . Iron deficiency anemia 05/16/2007  . ASTHMA, PERSISTENT, MODERATE 05/16/2007  . OBESITY, NOS 06/11/2006  . Anxiety state 06/11/2006  . Migraine headache 06/11/2006  . ENDOMETRIOSIS 06/11/2006    Past Surgical History:  Procedure Laterality Date  . APPENDECTOMY    . endometrial ablation spring  2008  . GASTRIC BYPASS  2013  . HYSTEROSCOPY WITH NOVASURE  08/12/2012   Procedure: HYSTEROSCOPY WITH NOVASURE;  Surgeon: Marylynn Pearson, MD;  Location: [facility];  Service: Gynecology;  Laterality: N/A;  . LAPAROSCOPIC TUBAL LIGATION  08/12/2012   Procedure: LAPAROSCOPIC TUBAL LIGATION;  Surgeon: Marylynn Pearson, MD;  Location: [facility];  Service: Gynecology;  Laterality: Bilateral;  WITH FILSHIE CLIPS  . right knee lateral release surgery  2011  . TONSILLECTOMY    . tumor removed  2012  removed right ankle    OB History   No obstetric history on file.      Home Medications    Prior to Admission medications   Medication Sig Start Date End Date Taking? Authorizing Provider  ALPRAZolam (XANAX) 0.5 MG tablet TAKE 1 TABLET(0.5 MG) BY MOUTH AT BEDTIME AS NEEDED FOR ANXIETY 07/16/18   Breeback, Jade L, PA-C  busPIRone (BUSPAR) 10 MG tablet Take 1 tablet (10 mg total) by mouth 2 (two) times daily. 01/14/19   Breeback, Jade L, PA-C  cephALEXin (KEFLEX) 500 MG capsule Take 1 capsule (500 mg total) by mouth 2 (two) times daily. 04/09/19   Robyn Haber, MD  Galcanezumab-gnlm  (EMGALITY) 120 MG/ML SOAJ Inject 1 mL as directed every 30 (thirty) days. 03/18/19   Breeback, Royetta Car, PA-C  HYDROcodone-acetaminophen (NORCO) 5-325 MG tablet Take 1 tablet by mouth every 6 (six) hours as needed for moderate pain. 04/09/19   Robyn Haber, MD  lisinopril-hydrochlorothiazide (ZESTORETIC) 20-12.5 MG tablet Take 1 tablet by mouth daily. 08/21/18   Breeback, Jade L, PA-C  montelukast (SINGULAIR) 10 MG tablet Take 1 tablet (10 mg total) by mouth at bedtime. 08/15/18   Breeback, Royetta Car, PA-C  Multiple Vitamins-Minerals (MULTIVITAMIN WITH MINERALS) tablet Take 1 tablet by mouth daily.     [provider]  ondansetron (ZOFRAN) 8 MG tablet Take 1 tablet (8 mg total) by mouth every 8 (eight) hours as needed for nausea or vomiting. 04/07/18   Daleen Bo, MD  pantoprazole (PROTONIX) 40 MG tablet Take 40 mg by mouth 2 (two) times daily. 02/13/18   [provider]  promethazine (PHENERGAN) 25 MG tablet Take 1 tablet (25 mg total) by mouth every 6 (six) hours as needed for nausea or vomiting. 04/16/19   Noe Gens, PA-C  rizatriptan (MAXALT) 10 MG tablet Take 1 tablet (10 mg total) by mouth as needed for migraine. May repeat in 2 hours if needed 08/05/18   Iran Planas L, PA-C  traZODone (DESYREL) 50 MG tablet Take one to three tablets and bedtime. 08/18/18   Breeback, Royetta Car, PA-C  vortioxetine HBr (TRINTELLIX) 20 MG TABS tablet Take 1 tablet (20 mg total) by mouth daily. 01/14/19   Donella Stade, PA-C    Family History Family History  Problem Relation Age of Onset  . Cancer Father        metastatic, colon ca  . Hypertension Mother   . Diabetes Mother   . Breast cancer Mother   . Cancer Mother 57       breast cancer  . Breast cancer Maternal Aunt   . Breast cancer Paternal Aunt   . Breast cancer Maternal Grandmother   . Breast cancer Paternal Grandmother     Social History Social History   Tobacco Use  . Smoking status: Never Smoker  . Smokeless tobacco:  Never Used  Substance Use Topics  . Alcohol use: No  . Drug use: No     Allergies   Gabapentin, Morphine and related, and Prednisone   Review of Systems Review of Systems  Constitutional: Negative for chills and fever.  Gastrointestinal: Positive for nausea and vomiting. Negative for abdominal pain and diarrhea.  Genitourinary: Negative for dysuria, flank pain, frequency and urgency.  Musculoskeletal: Negative for arthralgias, back pain, joint swelling and myalgias.  Neurological: Negative for dizziness, light-headedness and headaches.     Physical Exam Triage Vital Signs ED Triage Vitals  Enc Vitals Group     BP 04/16/19 1105 111/76  Pulse Rate 04/16/19 1105 76     Resp 04/16/19 1105 20     Temp 04/16/19 1105 97.8 F (36.6 C)     Temp Source 04/16/19 1105 Oral     SpO2 04/16/19 1105 98 %     Weight 04/16/19 1106 209 lb (94.8 kg)     Height 04/16/19 1106 5\' 6"  (1.676 m)     Head Circumference --      Peak Flow --      Pain Score 04/16/19 1106 0     Pain Loc --      Pain Edu? --      Excl. in [place]? --    No data found.  Updated Vital Signs BP 111/76 (BP Location: Right Arm)   Pulse 76   Temp 97.8 F (36.6 C) (Oral)   Resp 20   Ht 5\' 6"  (1.676 m)   Wt 209 lb (94.8 kg)   SpO2 98%   BMI 33.73 kg/m   Visual Acuity Right Eye Distance:   Left Eye Distance:   Bilateral Distance:    Right Eye Near:   Left Eye Near:    Bilateral Near:     Physical Exam Vitals signs and nursing note reviewed.  Constitutional:      Appearance: Normal appearance. She is well-developed.  HENT:     Head: Normocephalic and atraumatic.  Neck:     Musculoskeletal: Normal range of motion.  Cardiovascular:     Rate and Rhythm: Normal rate and regular rhythm.  Pulmonary:     Effort: Pulmonary effort is normal.     Breath sounds: Normal breath sounds.  Abdominal:     General: There is no distension.     Palpations: Abdomen is soft.     Tenderness: There is no abdominal  tenderness. There is no right CVA tenderness or left CVA tenderness.  Musculoskeletal: Normal range of motion.        General: No swelling or tenderness.  Skin:    General: Skin is warm and dry.     Comments: Right hand: two well healed wounds with sutures in place. No edema, erythema or drainage from wounds. Non-tender.  Left leg: one faint erythematous puncture wound c/w bee sting. No induration, edema, or warmth. No drainage.   No rashes.   Neurological:     Mental Status: She is alert and oriented to person, place, and time.  Psychiatric:        Behavior: Behavior normal.      UC Treatments / Results  Labs (all labs ordered are listed, but only abnormal results are displayed) Labs Reviewed - No data to display  EKG   Radiology No results found.  Procedures Procedures (including critical care time)  Medications Ordered in UC Medications - No data to display  Initial Impression / Assessment and Plan / UC Course  I have reviewed the triage vital signs and the nursing notes.  Pertinent labs & imaging results that were available during my care of the patient were reviewed by me and considered in my medical decision making (see chart for details).     Normal vitals. Benign abdominal exam. Reassuring pt has not had any vomiting today.  Symptoms could have been from yellow jacket stings the other day as symptoms started the next day, possibly mild viral illness or food borne illness.  Encouraged conservative treatment with bland diet and plenty of fluids. Phenergan sent for pt to try instead of zofran. Work note also provided.  Pt requested sutures from hand removed while here today. Wounds appear to have healed well. Sutures removed by Rolland Bimler, RN.   Final Clinical Impressions(s) / UC Diagnoses   Final diagnoses:  Nausea and vomiting in adult  Visit for suture removal     Discharge Instructions      Be sure to get a lot of rest and stay well hydrated  with sports drinks, water, diluted juices, and clear sodas.  Avoid fried fatty food, spicy food, and milk as these foods can cause worsening stomach upset.      ED Prescriptions    Medication Sig Dispense Auth. Provider   promethazine (PHENERGAN) 25 MG tablet Take 1 tablet (25 mg total) by mouth every 6 (six) hours as needed for nausea or vomiting. 12 tablet Noe Gens, PA-C     Controlled Substance Prescriptions [place] Controlled Substance Registry consulted? Not Applicable   Noe Gens, PA-C 04/16/19 1309

## 2019-04-16 NOTE — Discharge Instructions (Signed)
°  Be sure to get a lot of rest and stay well hydrated with sports drinks, water, diluted juices, and clear sodas.  Avoid fried fatty food, spicy food, and milk as these foods can cause worsening stomach upset.  °

## 2019-04-20 ENCOUNTER — Encounter: Payer: Self-pay | Admitting: Physician Assistant

## 2019-04-20 MED ORDER — TRAZODONE HCL 150 MG PO TABS: 150.0000 mg | ORAL_TABLET | Freq: Every day | ORAL | 3 refills | Status: DC

## 2019-05-14 ENCOUNTER — Encounter: Payer: Self-pay | Admitting: Physician Assistant

## 2019-05-14 MED ORDER — ZOLPIDEM TARTRATE 5 MG PO TABS: 5.0000 mg | ORAL_TABLET | Freq: Every evening | ORAL | 1 refills | Status: DC | PRN

## 2019-06-14 ENCOUNTER — Other Ambulatory Visit: Payer: Self-pay | Admitting: Physician Assistant

## 2019-06-25 ENCOUNTER — Other Ambulatory Visit: Payer: Self-pay | Admitting: Physician Assistant

## 2019-06-25 DIAGNOSIS — I10 Essential (primary) hypertension: Secondary | ICD-10-CM

## 2019-08-01 ENCOUNTER — Other Ambulatory Visit: Payer: Self-pay | Admitting: Physician Assistant

## 2019-08-01 DIAGNOSIS — F329 Major depressive disorder, single episode, unspecified: Secondary | ICD-10-CM

## 2019-08-01 DIAGNOSIS — F32A Depression, unspecified: Secondary | ICD-10-CM

## 2019-08-05 MED ORDER — CHLOROPHYLL EX
10.00 | CUTANEOUS | Status: DC
Start: ? — End: 2019-08-05

## 2019-08-05 MED ORDER — ANTACID 311-232 MG OR CAPS
1.00 | ORAL_CAPSULE | ORAL | Status: DC
Start: ? — End: 2019-08-05

## 2019-08-05 MED ORDER — DOLACET PO
0.10 | ORAL | Status: DC
Start: ? — End: 2019-08-05

## 2019-08-05 MED ORDER — CELLULOSE SODIUM PHOSPHATE VI
5000.00 | Status: DC
Start: 2019-08-04 — End: 2019-08-05

## 2019-08-05 MED ORDER — Medication
1.00 | Status: DC
Start: 2019-08-04 — End: 2019-08-05

## 2019-08-05 MED ORDER — CVS ASPIRIN CHILD PO
10.00 | ORAL | Status: DC
Start: 2019-08-05 — End: 2019-08-05

## 2019-08-05 MED ORDER — METHADOSE 5 MG PO TABS
4.00 | ORAL_TABLET | ORAL | Status: DC
Start: ? — End: 2019-08-05

## 2019-08-05 MED ORDER — BARO-CAT PO
1000.00 | ORAL | Status: DC
Start: ? — End: 2019-08-05

## 2019-08-05 MED ORDER — BAYER WOMENS 81-300 MG PO TABS
40.00 | ORAL_TABLET | ORAL | Status: DC
Start: 2019-08-04 — End: 2019-08-05

## 2019-08-05 MED ORDER — PKU 1 PO POWD
10.00 | ORAL | Status: DC
Start: 2019-08-04 — End: 2019-08-05

## 2019-08-05 MED ORDER — PCCA BIOPEPTIDE BASE EX CREA
300.00 | TOPICAL_CREAM | CUTANEOUS | Status: DC
Start: 2019-08-04 — End: 2019-08-05

## 2019-08-05 MED ORDER — EQUATE NICOTINE 4 MG MT GUM
8.00 | CHEWING_GUM | OROMUCOSAL | Status: DC
Start: ? — End: 2019-08-05

## 2019-08-05 MED ORDER — SUPER B-50 B COMPLEX PO
0.40 | ORAL | Status: DC
Start: ? — End: 2019-08-05

## 2019-08-05 MED ORDER — LOVENOX 150 MG/ML ~~LOC~~ SOLN
0.20 | SUBCUTANEOUS | Status: DC
Start: ? — End: 2019-08-05

## 2019-08-05 MED ORDER — TRAZODONE HCL 50 MG PO TABS
50.00 | ORAL_TABLET | ORAL | Status: DC
Start: ? — End: 2019-08-05

## 2019-08-05 MED ORDER — GLUCOLESS PO CAPS
200.00 | ORAL_CAPSULE | ORAL | Status: DC
Start: 2019-08-04 — End: 2019-08-05

## 2019-08-05 MED ORDER — Medication
Status: DC
Start: ? — End: 2019-08-05

## 2019-08-05 MED ORDER — NUPRIN FLU RELIEF & BODY ACHE PO
80.00 | ORAL | Status: DC
Start: ? — End: 2019-08-05

## 2019-08-05 MED ORDER — SOMATROPIN IJ
100.00 | INTRAMUSCULAR | Status: DC
Start: ? — End: 2019-08-05

## 2019-08-25 ENCOUNTER — Other Ambulatory Visit: Payer: Self-pay | Admitting: Neurology

## 2019-08-25 DIAGNOSIS — G43009 Migraine without aura, not intractable, without status migrainosus: Secondary | ICD-10-CM

## 2019-08-25 MED ORDER — EMGALITY 120 MG/ML ~~LOC~~ SOAJ: 1.0000 mL | SUBCUTANEOUS | 0 refills | Status: DC

## 2019-09-02 ENCOUNTER — Ambulatory Visit (INDEPENDENT_AMBULATORY_CARE_PROVIDER_SITE_OTHER): Payer: 59 | Admitting: Physician Assistant

## 2019-09-02 ENCOUNTER — Encounter: Payer: Self-pay | Admitting: Physician Assistant

## 2019-09-02 VITALS — Temp 97.9°F | Ht 66.0 in | Wt 209.0 lb

## 2019-09-02 DIAGNOSIS — K219 Gastro-esophageal reflux disease without esophagitis: Secondary | ICD-10-CM

## 2019-09-02 DIAGNOSIS — G43009 Migraine without aura, not intractable, without status migrainosus: Secondary | ICD-10-CM

## 2019-09-02 DIAGNOSIS — F329 Major depressive disorder, single episode, unspecified: Secondary | ICD-10-CM | POA: Diagnosis not present

## 2019-09-02 DIAGNOSIS — F5101 Primary insomnia: Secondary | ICD-10-CM | POA: Diagnosis not present

## 2019-09-02 DIAGNOSIS — F419 Anxiety disorder, unspecified: Secondary | ICD-10-CM | POA: Diagnosis not present

## 2019-09-02 DIAGNOSIS — F32A Depression, unspecified: Secondary | ICD-10-CM

## 2019-09-02 MED ORDER — BUSPIRONE HCL 10 MG PO TABS: 10.0000 mg | ORAL_TABLET | Freq: Two times a day (BID) | ORAL | 1 refills | Status: DC

## 2019-09-02 MED ORDER — VORTIOXETINE HBR 20 MG PO TABS: 20.0000 mg | ORAL_TABLET | Freq: Every day | ORAL | 1 refills | Status: DC

## 2019-09-02 MED ORDER — AJOVY 225 MG/1.5ML ~~LOC~~ SOAJ: 225.0000 mg | SUBCUTANEOUS | 1 refills | Status: DC

## 2019-09-02 MED ORDER — PANTOPRAZOLE SODIUM 40 MG PO TBEC: 40.0000 mg | DELAYED_RELEASE_TABLET | Freq: Two times a day (BID) | ORAL | 1 refills | Status: DC

## 2019-09-02 MED ORDER — QUETIAPINE FUMARATE 25 MG PO TABS: 25.0000 mg | ORAL_TABLET | Freq: Every day | ORAL | 5 refills | Status: DC

## 2019-09-02 NOTE — Progress Notes (Deleted)
Headaches off and on since stopping blood pressure medication Does not have a cuff to check it  Needs refills. PHQ9 (7) -GAD7 (9) completed.

## 2019-09-02 NOTE — Progress Notes (Signed)
Patient ID: Denise Craig, female   DOB: 08-30-1971, 48 y.o.   MRN: [id number] .Marland KitchenVirtual Visit via Video Note  I connected with Myrissa Bubier Claw on 09/02/19 at  9:30 AM EST by a video enabled telemedicine application and verified that I am speaking with the correct person using two identifiers.  Location: Patient: home Provider: clinic   I discussed the limitations of evaluation and management by telemedicine and the availability of in person appointments. The patient expressed understanding and agreed to proceed.  History of Present Illness: Pt is a 48 yo female with Migraines, MDD, Anxiety, insomnia, GERD who calls into the clinic for medication refill.   Mood is better. She feels pretty controlled on this medication. Lorrin Mais is not helping with sleep like she wished it would. She continues to have trouble going and staying asleep.   Her migraines are much better since emgality. She does feel like they are worse the week before her next injection. She always wants to take early.   GERD is better after procedure. Increased protonix to bid.    .. Active Ambulatory Problems    Diagnosis Date Noted  . THYROMEGALY 12/11/2007  . OBESITY, NOS 06/11/2006  . Iron deficiency anemia 05/16/2007  . ANEMIA, VITAMIN B12 DEFICIENCY 06/04/2007  . Anxiety state 06/11/2006  . Migraine headache 06/11/2006  . ASTHMA, PERSISTENT, MODERATE 05/16/2007  . HIATAL HERNIA 12/22/2007  . IBS 06/16/2009  . ENDOMETRIOSIS 06/11/2006  . INSOMNIA 07/15/2008  . Essential hypertension 02/27/2011  . GERD (gastroesophageal reflux disease) 09/17/2012  . Nephrolithiasis 07/14/2013  . Periumbilical abdominal pain 01/25/2016  . 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 Anxiety and depression 06/01/2016  . Hot flashes 09/21/2016  . Family history of breast cancer 09/21/2016  . At risk for impaired digestion 09/21/2016  . Menopause 09/24/2016  . Vitamin D deficiency 09/24/2016  . Low iron stores 09/24/2016  .  Fidgeting 01/21/2018  . Chest tightness 01/21/2018  . No energy 01/21/2018  . Poor concentration 01/21/2018   Resolved Ambulatory Problems    Diagnosis Date Noted  . Leg edema 01/02/2011   Past Medical History:  Diagnosis Date  . Anxiety   . Asthma   . B12 deficiency   . Depression   . Infertility, female   . Iron deficiency   . Migraine   . PONV (postoperative nausea and vomiting)   . 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 Uterine polyp    Reviewed med, allergy, problem list.    Observations/Objective: No acute distress. Normal mood and appearance.   .. Today's Vitals   09/02/19 0856  Temp: 97.9 F (36.6 C)  TempSrc: Oral  Weight: 209 lb (94.8 kg)  Height: 5\' 6"  (1.676 m)   Body mass index is 33.73 kg/m.  .. Depression screen [facility] 2/9 09/02/2019 01/14/2019 08/05/2018 03/23/2018 01/21/2018  Decreased Interest 1 0 2 1 1   Down, Depressed, Hopeless 0 1 1 0 2  PHQ - 2 Score 1 1 3 1 3   Altered sleeping 2 2 1 3 3   Tired, decreased energy 2 2 2 3 3   Change in appetite 0 0 1 0 2  Feeling bad or failure about yourself  1 0 1 0 0  Trouble concentrating 0 1 1 0 1  Moving slowly or fidgety/restless 1 1 1  0 1  Suicidal thoughts 0 0 0 0 0  PHQ-9 Score 7 7 10 7 13   Difficult doing work/chores Somewhat difficult Somewhat difficult Somewhat difficult Not difficult at all Somewhat difficult   .Marland Kitchen  GAD 7 : Generalized Anxiety Score 09/02/2019 01/14/2019 08/05/2018 03/23/2018  Nervous, Anxious, on Edge 1 1 2 2   Control/stop worrying 2 2 1 2   Worry too much - different things 2 2 1 2   Trouble relaxing 1 2 1 1   Restless 1 1 2 1   Easily annoyed or irritable 2 3 2 1   Afraid - awful might happen 0 0 0 1  Total GAD 7 Score 9 11 9 10   Anxiety Difficulty Somewhat difficult Somewhat difficult Somewhat difficult Somewhat difficult       Assessment and Plan: Marland KitchenMarland KitchenDiagnoses and all orders for this visit:  Migraine without aura and without status migrainosus, not intractable -      Fremanezumab-vfrm (AJOVY) 225 MG/1.5ML SOAJ; Inject 225 mg into the skin every 30 (thirty) days.  Anxiety and depression -     busPIRone (BUSPAR) 10 MG tablet; Take 1 tablet (10 mg total) by mouth 2 (two) times daily. -     vortioxetine HBr (TRINTELLIX) 20 MG TABS tablet; Take 1 tablet (20 mg total) by mouth daily.  Primary insomnia -     QUEtiapine (SEROQUEL) 25 MG tablet; Take 1 tablet (25 mg total) by mouth at bedtime.  Gastroesophageal reflux disease, unspecified whether esophagitis present -     pantoprazole (PROTONIX) 40 MG tablet; Take 1 tablet (40 mg total) by mouth 2 (two) times daily.   Stop emgality. Start ajovy for longer half life. Follow up in 3 months.   Continue on same mood regimen. Added seroquel for sleep. Stop ambien.   GERD managed by GI. protonix refilled today.    Follow Up Instructions:    I discussed the assessment and treatment plan with the patient. The patient was provided an opportunity to ask questions and all were answered. The patient agreed with the plan and demonstrated an understanding of the instructions.   The patient was advised to call back or seek an in-person evaluation if the symptoms worsen or if the condition fails to improve as anticipated.   Iran Planas, PA-C

## 2019-09-07 ENCOUNTER — Encounter: Payer: Self-pay | Admitting: Physician Assistant

## 2019-09-07 ENCOUNTER — Other Ambulatory Visit: Payer: Self-pay | Admitting: Physician Assistant

## 2019-11-12 ENCOUNTER — Other Ambulatory Visit: Payer: Self-pay

## 2019-11-12 ENCOUNTER — Ambulatory Visit: Payer: 59 | Admitting: Family Medicine

## 2019-11-12 ENCOUNTER — Ambulatory Visit: Payer: 59 | Admitting: Nurse Practitioner

## 2019-11-12 ENCOUNTER — Encounter: Payer: Self-pay | Admitting: Nurse Practitioner

## 2019-11-12 VITALS — BP 138/75 | HR 72 | Temp 97.8°F | Ht 66.0 in | Wt 218.0 lb

## 2019-11-12 DIAGNOSIS — B028 Zoster with other complications: Secondary | ICD-10-CM

## 2019-11-12 MED ORDER — LIDOCAINE 5 % EX OINT: 1.0000 "application " | TOPICAL_OINTMENT | Freq: Four times a day (QID) | CUTANEOUS | 0 refills | Status: DC | PRN

## 2019-11-12 MED ORDER — GABAPENTIN 100 MG PO CAPS: ORAL_CAPSULE | ORAL | 3 refills | Status: DC

## 2019-11-12 MED ORDER — VALACYCLOVIR HCL 1 G PO TABS: 1000.0000 mg | ORAL_TABLET | Freq: Three times a day (TID) | ORAL | 0 refills | Status: DC

## 2019-11-12 NOTE — Progress Notes (Signed)
Acute Office Visit  Subjective:    Patient ID: Denise Craig, female    DOB: December 12, 1970, 49 y.o.   MRN: [id number]  Chief Complaint  Patient presents with  . Facial Swelling    HPI Patient is in today for redness, swelling, and pain over her left eye.  She reports she first noticed a slight amount of redness/irritation to the upper lid of the left eye on Sunday/Monday.  She reports on Monday the area was tender and on Tuesday she noticed that her scalp felt slightly tender on the left side.  Yesterday she reports an outbreak of a painful, red rash around her left eye across her forehead and up into her scalp.  When she awoke this morning her eye was significantly swollen and red.  She did have chickenpox as a child.  She has never had shingles or the shingles vaccine.  She reports that her husband does not believe he has had chickenpox or the vaccine.  Past Medical History:  Diagnosis Date  . Anxiety   . Asthma    has not used inhaler in past 10 months-will bring dos  . B12 deficiency   . Depression   . GERD (gastroesophageal reflux disease)    w/ moderate hiatal hernia (EGD 3-09), Dr August Saucer prn, prilosec  . Infertility, female   . Iron deficiency   . Migraine    sleep problems- sees Dr Berdine Addison, EEG normal 12-07  . Nephrolithiasis   . PONV (postoperative nausea and vomiting)   . 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 Uterine polyp     Past Surgical History:  Procedure Laterality Date  . APPENDECTOMY    . endometrial ablation spring  2008  . GASTRIC BYPASS  2013  . HYSTEROSCOPY WITH NOVASURE  08/12/2012   Procedure: HYSTEROSCOPY WITH NOVASURE;  Surgeon: Marylynn Pearson, MD;  Location: [facility];  Service: Gynecology;  Laterality: N/A;  . LAPAROSCOPIC TUBAL LIGATION  08/12/2012   Procedure: LAPAROSCOPIC TUBAL LIGATION;  Surgeon: Marylynn Pearson, MD;  Location: [facility];  Service: Gynecology;  Laterality: Bilateral;  WITH FILSHIE CLIPS  . right knee lateral release  surgery  2011  . TONSILLECTOMY    . tumor removed  2012   removed right ankle    Family History  Problem Relation Age of Onset  . Cancer Father        metastatic, colon ca  . Hypertension Mother   . Diabetes Mother   . Breast cancer Mother   . Cancer Mother 66       breast cancer  . Breast cancer Maternal Aunt   . Breast cancer Paternal Aunt   . Breast cancer Maternal Grandmother   . Breast cancer Paternal Grandmother     Social History   Socioeconomic History  . Marital status: Married    Spouse name: Not on file  . Number of children: Not on file  . Years of education: Not on file  . Highest education level: Not on file  Occupational History  . Not on file  Tobacco Use  . Smoking status: Never Smoker  . Smokeless tobacco: Never Used  Substance and Sexual Activity  . Alcohol use: No  . Drug use: No  . Sexual activity: Not on file  Other Topics Concern  . Not on file  Social History Narrative  . Not on file   Social Determinants of Health   Financial Resource Strain:   . Difficulty of Paying Living Expenses:   Food  Insecurity:   . Worried About Charity fundraiser in the Last Year:   . Arboriculturist in the Last Year:   Transportation Needs:   . Film/video editor (Medical):   Marland Kitchen Lack of Transportation (Non-Medical):   Physical Activity:   . Days of Exercise per Week:   . Minutes of Exercise per Session:   Stress:   . Feeling of Stress :   Social Connections:   . Frequency of Communication with Friends and Family:   . Frequency of Social Gatherings with Friends and Family:   . Attends Religious Services:   . Active Member of Clubs or Organizations:   . Attends Archivist Meetings:   Marland Kitchen Marital Status:   Intimate Partner Violence:   . Fear of Current or Ex-Partner:   . Emotionally Abused:   Marland Kitchen Physically Abused:   . Sexually Abused:     Outpatient Medications Prior to Visit  Medication Sig Dispense Refill  . ALPRAZolam (XANAX) 0.5  MG tablet TAKE 1 TABLET(0.5 MG) BY MOUTH AT BEDTIME AS NEEDED FOR ANXIETY 30 tablet 0  . busPIRone (BUSPAR) 10 MG tablet Take 1 tablet (10 mg total) by mouth 2 (two) times daily. 180 tablet 1  . Fremanezumab-vfrm (AJOVY) 225 MG/1.5ML SOAJ Inject 225 mg into the skin every 30 (thirty) days. 4.5 mL 1  . lisinopril-hydrochlorothiazide (ZESTORETIC) 20-12.5 MG tablet Take 1 tablet by mouth daily. NEEDS LABS 90 tablet 0  . montelukast (SINGULAIR) 10 MG tablet TAKE 1 TABLET BY MOUTH AT  BEDTIME 90 tablet 1  . Multiple Vitamins-Minerals (MULTIVITAMIN WITH MINERALS) tablet Take 1 tablet by mouth daily.     . ondansetron (ZOFRAN) 8 MG tablet Take 1 tablet (8 mg total) by mouth every 8 (eight) hours as needed for nausea or vomiting. 20 tablet 0  . pantoprazole (PROTONIX) 40 MG tablet Take 1 tablet (40 mg total) by mouth 2 (two) times daily. 180 tablet 1  . QUEtiapine (SEROQUEL) 25 MG tablet Take 1 tablet (25 mg total) by mouth at bedtime. 30 tablet 5  . rizatriptan (MAXALT) 10 MG tablet Take 1 tablet (10 mg total) by mouth as needed for migraine. May repeat in 2 hours if needed 10 tablet 0  . vortioxetine HBr (TRINTELLIX) 20 MG TABS tablet Take 1 tablet (20 mg total) by mouth daily. 90 tablet 1   No facility-administered medications prior to visit.    Allergies  Allergen Reactions  . Ambien [Zolpidem]     Fecal incontinence.   . Gabapentin     Couldn't concentrate  . Morphine And Related Nausea And Vomiting  . Prednisone Other (See Comments)    "I don't like how it makes me feel"     Review of Systems  Constitutional: Negative for chills, fatigue and fever.  HENT: Negative for congestion, ear pain, facial swelling, hearing loss, mouth sores, postnasal drip, rhinorrhea, sinus pressure, sinus pain, sore throat, tinnitus, trouble swallowing and voice change.   Eyes: Positive for redness. Negative for photophobia, pain, discharge, itching and visual disturbance.  Respiratory: Negative for cough, chest  tightness and shortness of breath.   Cardiovascular: Negative for chest pain and palpitations.  Skin: Positive for color change and rash.  Neurological: Negative for dizziness, facial asymmetry, weakness, light-headedness, numbness and headaches.  Psychiatric/Behavioral: Negative for confusion and decreased concentration.       Objective:    Physical Exam Vitals and nursing note reviewed.  HENT:     Head: Left periorbital erythema  present.     Jaw: No tenderness, swelling or pain on movement.      Right Ear: Tympanic membrane normal.     Left Ear: Tympanic membrane normal.     Nose: Nose normal.     Mouth/Throat:     Pharynx: Oropharynx is clear.  Eyes:     General: Vision grossly intact. Gaze aligned appropriately. No visual field deficit.       Right eye: No foreign body or discharge.        Left eye: No foreign body or discharge.     Extraocular Movements:     Right eye: Normal extraocular motion and no nystagmus.     Left eye: Normal extraocular motion and no nystagmus.     Conjunctiva/sclera:     Right eye: Right conjunctiva is not injected. No chemosis, exudate or hemorrhage.    Left eye: Left conjunctiva is injected. No chemosis, exudate or hemorrhage.  Cardiovascular:     Rate and Rhythm: Normal rate and regular rhythm.     Pulses: Normal pulses.     Heart sounds: Normal heart sounds.  Pulmonary:     Effort: Pulmonary effort is normal.     Breath sounds: Normal breath sounds.  Musculoskeletal:     Cervical back: Normal range of motion and neck supple. No rigidity.  Skin:    General: Skin is warm and dry.     Capillary Refill: Capillary refill takes less than 2 seconds.     Findings: Lesion and rash present. Rash is vesicular.  Neurological:     General: No focal deficit present.     Mental Status: She is alert. Mental status is at baseline. She is disoriented.     Cranial Nerves: No cranial nerve deficit.     Sensory: No sensory deficit.  Psychiatric:         Mood and Affect: Mood normal.        Behavior: Behavior normal.        Thought Content: Thought content normal.        Judgment: Judgment normal.     BP 138/75   Pulse 72   Temp 97.8 F (36.6 C) (Oral)   Ht 5\' 6"  (1.676 m)   Wt 218 lb 0.6 oz (98.9 kg)   SpO2 98%   BMI 35.19 kg/m  Wt Readings from Last 3 Encounters:  11/12/19 218 lb 0.6 oz (98.9 kg)  09/02/19 209 lb (94.8 kg)  04/16/19 209 lb (94.8 kg)    Health Maintenance Due  Topic Date Due  . HIV Screening  Never done  . PAP SMEAR-Modifier  07/22/2017    There are no preventive care reminders to display for this patient.   Lab Results  Component Value Date   TSH 2.31 01/24/2018   Lab Results  Component Value Date   WBC 6.2 04/07/2018   HGB 12.9 04/07/2018   HCT 38.9 04/07/2018   MCV 88.8 04/07/2018   PLT 265 04/07/2018   Lab Results  Component Value Date   NA 143 04/07/2018   K 2.9 (L) 04/07/2018   CO2 27 04/07/2018   GLUCOSE 98 04/07/2018   BUN 8 04/07/2018   CREATININE 0.67 04/07/2018   BILITOT 0.3 04/07/2018   ALKPHOS 60 04/07/2018   AST 23 04/07/2018   ALT 23 04/07/2018   PROT 5.9 (L) 04/07/2018   ALBUMIN 3.1 (L) 04/07/2018   CALCIUM 8.3 (L) 04/07/2018   ANIONGAP 7 04/07/2018   Lab Results  Component Value Date  CHOL 156 08/31/2015   Lab Results  Component Value Date   HDL 78 08/31/2015   Lab Results  Component Value Date   LDLCALC 65 08/31/2015   Lab Results  Component Value Date   TRIG 65 08/31/2015   Lab Results  Component Value Date   CHOLHDL 2.0 08/31/2015   No results found for: HGBA1C     Assessment & Plan:   1. Herpes zoster with complication Symptoms and presentation most correlate with herpes zoster predominantly of the left hemisphere of the scalp and forehead with left eye involvement.  At this time visual changes are not present however the patient was strongly encouraged to make an appointment with her eye doctor today or tomorrow at the latest to be evaluated  to ensure that there is no ocular damage present.  If the patient is unable to get into the eye doctor urgently she was told to contact the office and we will send an urgent referral for her. Discussed the requirement of avoiding those who have yet to have chickenpox or the chickenpox vaccine including children and pregnant individuals.  Discussed frequent handwashing.  Discussed that she is contagious until the lesions have scabbed over. The patient's husband does not believe he had chickenpox as a child and he has not had the chickenpox vaccine.  He is a patient at this office therefore prophylactic valacyclovir was sent for him as well. Prescription for valacyclovir, lidocaine ointment, and gabapentin sent to the pharmacy. Patient to follow-up if symptoms worsen or fail to improve. - valACYclovir (VALTREX) 1000 MG tablet; Take 1 tablet (1,000 mg total) by mouth 3 (three) times daily.  Dispense: 21 tablet; Refill: 0 - lidocaine (XYLOCAINE) 5 % ointment; Apply 1 application topically 4 (four) times daily as needed.  Dispense: 30 g; Refill: 0 - gabapentin (NEURONTIN) 100 MG capsule; 100 mg (1 tab) - 300 mg (3 tabs) up to three times a day as needed for nerve pain  Dispense: 120 capsule; Refill: 3   Orma Render, NP

## 2019-11-12 NOTE — Patient Instructions (Addendum)
I highly recommend you go to see your eye doctor as soon as possible. If you have trouble getting in I can send an urgent referral for you. Shingles can cause damage to the optic nerve and this can damage your vision permanently.   Shingles  Shingles, which is also known as herpes zoster, is an infection that causes a painful skin rash and fluid-filled blisters. It is caused by a virus. Shingles only develops in people who:  Have had chickenpox.  Have been given a medicine to protect against chickenpox (have been vaccinated). Shingles is rare in this group. What are the causes? Shingles is caused by varicella-zoster virus (VZV). This is the same virus that causes chickenpox. After a person is exposed to VZV, the virus stays in the body in an inactive (dormant) state. Shingles develops if the virus is reactivated. This can happen many years after the first (initial) exposure to VZV. It is not known what causes this virus to be reactivated. What increases the risk? People who have had chickenpox or received the chickenpox vaccine are at risk for shingles. Shingles infection is more common in people who:  Are older than age 86.  Have a weakened disease-fighting system (immune system), such as people with: ? HIV. ? AIDS. ? Cancer.  Are taking medicines that weaken the immune system, such as transplant medicines.  Are experiencing a lot of stress. What are the signs or symptoms? Denise Craig symptoms of this condition include itching, tingling, and pain in an area on your skin. Pain may be described as burning, stabbing, or throbbing. A few days or weeks after Denise Craig symptoms start, a painful red rash appears. The rash is usually on one side of the body and has a band-like or belt-like pattern. The rash eventually turns into fluid-filled blisters that break open, change into scabs, and dry up in about 2-3 weeks. At any time during the infection, you may also develop:  A fever.  Chills.  A  headache.  An upset stomach. How is this diagnosed? This condition is diagnosed with a skin exam. Skin or fluid samples may be taken from the blisters before a diagnosis is made. These samples are examined under a microscope or sent to a lab for testing. How is this treated? The rash may last for several weeks. There is not a specific cure for this condition. Your health care provider will probably prescribe medicines to help you manage pain, recover more quickly, and avoid long-term problems. Medicines may include:  Antiviral drugs.  Anti-inflammatory drugs.  Pain medicines.  Anti-itching medicines (antihistamines). If the area involved is on your face, you may be referred to a specialist, such as an eye doctor (ophthalmologist) or an ear, nose, and throat (ENT) doctor (otolaryngologist) to help you avoid eye problems, chronic pain, or disability. Follow these instructions at home: Medicines  Take over-the-counter and prescription medicines only as told by your health care provider.  Apply an anti-itch cream or numbing cream to the affected area as told by your health care provider. Relieving itching and discomfort   Apply cold, wet cloths (cold compresses) to the area of the rash or blisters as told by your health care provider.  Cool baths can be soothing. Try adding baking soda or dry oatmeal to the water to reduce itching. Do not bathe in hot water. Blister and rash care  Keep your rash covered with a loose bandage (dressing). Wear loose-fitting clothing to help ease the pain of material rubbing against the  rash.  Keep your rash and blisters clean by washing the area with mild soap and cool water as told by your health care provider.  Check your rash every day for signs of infection. Check for: ? More redness, swelling, or pain. ? Fluid or blood. ? Warmth. ? Pus or a bad smell.  Do not scratch your rash or pick at your blisters. To help avoid scratching: ? Keep your  fingernails clean and cut short. ? Wear gloves or mittens while you sleep, if scratching is a problem. General instructions  Rest as told by your health care provider.  Keep all follow-up visits as told by your health care provider. This is important.  Wash your hands often with soap and water. If soap and water are not available, use hand sanitizer. Doing this lowers your chance of getting a bacterial skin infection.  Before your blisters change into scabs, your shingles infection can cause chickenpox in people who have never had it or have never been vaccinated against it. To prevent this from happening, avoid contact with other people, especially: ? Babies. ? Pregnant women. ? Children who have eczema. ? Elderly people who have transplants. ? People who have chronic illnesses, such as cancer or AIDS. Contact a health care provider if:  Your pain is not relieved with prescribed medicines.  Your pain does not get better after the rash heals.  You have signs of infection in the rash area, such as: ? More redness, swelling, or pain around the rash. ? Fluid or blood coming from the rash. ? The rash area feeling warm to the touch. ? Pus or a bad smell coming from the rash. Get help right away if:  The rash is on your face or nose.  You have facial pain, pain around your eye area, or loss of feeling on one side of your face.  You have difficulty seeing.  You have ear pain or have ringing in your ear.  You have a loss of taste.  Your condition gets worse. Summary  Shingles, which is also known as herpes zoster, is an infection that causes a painful skin rash and fluid-filled blisters.  This condition is diagnosed with a skin exam. Skin or fluid samples may be taken from the blisters and examined before the diagnosis is made.  Keep your rash covered with a loose bandage (dressing). Wear loose-fitting clothing to help ease the pain of material rubbing against the  rash.  Before your blisters change into scabs, your shingles infection can cause chickenpox in people who have never had it or have never been vaccinated against it. This information is not intended to replace advice given to you by your health care provider. Make sure you discuss any questions you have with your health care provider. Document Revised: 12/12/2018 Document Reviewed: 04/24/2017 Elsevier Patient Education  2020 [employer].

## 2019-11-19 ENCOUNTER — Encounter: Payer: Self-pay | Admitting: Nurse Practitioner

## 2019-11-19 ENCOUNTER — Telehealth: Payer: Self-pay

## 2019-11-19 ENCOUNTER — Other Ambulatory Visit: Payer: Self-pay | Admitting: Nurse Practitioner

## 2019-11-19 DIAGNOSIS — B028 Zoster with other complications: Secondary | ICD-10-CM

## 2019-11-19 MED ORDER — VALACYCLOVIR HCL 1 G PO TABS: 1000.0000 mg | ORAL_TABLET | Freq: Three times a day (TID) | ORAL | 0 refills | Status: DC
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
## 2019-11-19 NOTE — Telephone Encounter (Signed)
This does not sound like it is an effect of the shingles, but could be a result of a ruptured blood vessel from increased pressure from the cough. I have never seen anything like this happen due to shingles.   We can put her on my schedule at her convenience tomorrow. In the meantime, I recommend she use ice packs on the area if it is painful or swollen- this should also help the redness/bruising.  If she is experiencing a bad headache that is not relieved with tylenol or symptoms of bleeding, changes in vision, numbness, or any alarming symptoms then she needs to go to Urgent Care or the emergency room tonight.

## 2019-11-20 ENCOUNTER — Encounter: Payer: Self-pay | Admitting: Nurse Practitioner

## 2019-11-20 ENCOUNTER — Telehealth (INDEPENDENT_AMBULATORY_CARE_PROVIDER_SITE_OTHER): Payer: 59 | Admitting: Nurse Practitioner

## 2019-11-20 DIAGNOSIS — S0512XA Contusion of eyeball and orbital tissues, left eye, initial encounter: Secondary | ICD-10-CM | POA: Diagnosis not present

## 2019-11-20 DIAGNOSIS — B028 Zoster with other complications: Secondary | ICD-10-CM

## 2019-11-20 MED ORDER — PREDNISONE 20 MG PO TABS: 20.0000 mg | ORAL_TABLET | Freq: Every day | ORAL | 0 refills | Status: AC

## 2019-11-20 NOTE — Telephone Encounter (Signed)
Pt is scheduled for a MyChart visit with Emeterio Reeve Early, DNP, AGNP-C this morning at 10:30.

## 2019-11-20 NOTE — Patient Instructions (Signed)
Postherpetic Neuralgia Postherpetic neuralgia (PHN) is nerve pain that occurs after a shingles infection. Shingles is a painful rash that appears on one area of the body, usually on the trunk or face. Shingles is caused by the varicella-zoster virus. This is the same virus that causes chickenpox. In people who have had chickenpox, the virus can resurface years later and cause shingles. You may have PHN if you continue to have pain for 4 months after your shingles rash has gone away. PHN appears in the same area where you had the shingles rash. The pain usually goes away after the rash disappears. Getting a vaccination for shingles can prevent PHN. This vaccine is recommended for people older than 60. It may prevent shingles, and may also lower your risk of PHN if you do get shingles. What are the causes? This condition is caused by damage to your nerves from the varicella-zoster virus. The damage makes your nerves overly sensitive. What increases the risk? The following factors may make you more likely to develop this condition:  Being older than 49 years of age.  Having severe pain before your shingles rash starts.  Having a severe rash.  Having shingles in and around the eye area.  Having a disease that makes your body unable to fight infections (weak immune system). What are the signs or symptoms? The main symptom of this condition is pain. The pain may:  Often be very bad and may be described as stabbing, burning, or feeling like an electric shock.  Come and go or may be there all the time.  Be triggered by light touches on the skin or changes in temperature. You may have itching along with the pain. How is this diagnosed? This condition may be diagnosed based on your symptoms and your history of shingles. Lab studies and other diagnostic tests are usually not needed. How is this treated? There is no cure for this condition. Treatment for PHN will focus on pain relief.  Over-the-counter pain relievers do not usually relieve PHN pain. You may need to work with a pain specialist. Treatment may include:  Antidepressant medicines to help with pain and improve sleep.  Anti-seizure medicines to relieve nerve pain.  Strong pain relievers (opioids).  A numbing patch worn on the skin (lidocaine patch).  Botox (botulinum toxin) injections to block pain signals between nerves and muscles.  Injections of numbing medicine or anti-inflammatory medicines around irritated nerves. Follow these instructions at home:   It may take a long time to recover from PHN. Work closely with your health care provider and develop a good support system at home.  Take over-the-counter and prescription medicines only as told by your health care provider.  Do not drive or use heavy machinery while taking prescription pain medicine.  Wear loose, comfortable clothing.  Cover sensitive areas with a dressing to reduce friction from clothing rubbing on the area.  If directed, put ice on the painful area: ? Put ice in a plastic bag. ? Place a towel between your skin and the bag. ? Leave the ice on for 20 minutes, 2-3 times a day.  Talk to your health care provider if you feel depressed or desperate. Living with long-term pain can be depressing.  Keep all follow-up visits as told by your health care provider. This is important. Contact a health care provider if:  Your medicine is not helping.  You are struggling to manage your pain at home. Summary  Postherpetic neuralgia is a very painful disorder   that can occur after an episode of shingles.  The pain is often severe, burning, electric, or stabbing.  Prescription medicines can be helpful in managing persistent pain.  Getting a vaccination for shingles can prevent PHN. This vaccine is recommended for people older than 60. This information is not intended to replace advice given to you by your health care provider. Make sure  you discuss any questions you have with your health care provider. Document Revised: 08/02/2017 Document Reviewed: 11/06/2016 Elsevier Patient Education  [place], which is also known as herpes zoster, is an infection that causes a painful skin rash and fluid-filled blisters. It is caused by a virus. Shingles only develops in people who:  Have had chickenpox.  Have been given a medicine to protect against chickenpox (have been vaccinated). Shingles is rare in this group. What are the causes? Shingles is caused by varicella-zoster virus (VZV). This is the same virus that causes chickenpox. After a person is exposed to VZV, the virus stays in the body in an inactive (dormant) state. Shingles develops if the virus is reactivated. This can happen many years after the first (initial) exposure to VZV. It is not known what causes this virus to be reactivated. What increases the risk? People who have had chickenpox or received the chickenpox vaccine are at risk for shingles. Shingles infection is more common in people who:  Are older than age 29.  Have a weakened disease-fighting system (immune system), such as people with: ? HIV. ? AIDS. ? Cancer.  Are taking medicines that weaken the immune system, such as transplant medicines.  Are experiencing a lot of stress. What are the signs or symptoms? Shanan Fitzpatrick symptoms of this condition include itching, tingling, and pain in an area on your skin. Pain may be described as burning, stabbing, or throbbing. A few days or weeks after [place] symptoms start, a painful red rash appears. The rash is usually on one side of the body and has a band-like or belt-like pattern. The rash eventually turns into fluid-filled blisters that break open, change into scabs, and dry up in about 2-3 weeks. At any time during the infection, you may also develop:  A fever.  Chills.  A headache.  An upset stomach. How is this diagnosed? This  condition is diagnosed with a skin exam. Skin or fluid samples may be taken from the blisters before a diagnosis is made. These samples are examined under a microscope or sent to a lab for testing. How is this treated? The rash may last for several weeks. There is not a specific cure for this condition. Your health care provider will probably prescribe medicines to help you manage pain, recover more quickly, and avoid long-term problems. Medicines may include:  Antiviral drugs.  Anti-inflammatory drugs.  Pain medicines.  Anti-itching medicines (antihistamines). If the area involved is on your face, you may be referred to a specialist, such as an eye doctor (ophthalmologist) or an ear, nose, and throat (ENT) doctor (otolaryngologist) to help you avoid eye problems, chronic pain, or disability. Follow these instructions at home: Medicines  Take over-the-counter and prescription medicines only as told by your health care provider.  Apply an anti-itch cream or numbing cream to the affected area as told by your health care provider. Relieving itching and discomfort   Apply cold, wet cloths (cold compresses) to the area of the rash or blisters as told by your health care provider.  Cool baths can be soothing. Try adding  baking soda or dry oatmeal to the water to reduce itching. Do not bathe in hot water. Blister and rash care  Keep your rash covered with a loose bandage (dressing). Wear loose-fitting clothing to help ease the pain of material rubbing against the rash.  Keep your rash and blisters clean by washing the area with mild soap and cool water as told by your health care provider.  Check your rash every day for signs of infection. Check for: ? More redness, swelling, or pain. ? Fluid or blood. ? Warmth. ? Pus or a bad smell.  Do not scratch your rash or pick at your blisters. To help avoid scratching: ? Keep your fingernails clean and cut short. ? Wear gloves or mittens while  you sleep, if scratching is a problem. General instructions  Rest as told by your health care provider.  Keep all follow-up visits as told by your health care provider. This is important.  Wash your hands often with soap and water. If soap and water are not available, use hand sanitizer. Doing this lowers your chance of getting a bacterial skin infection.  Before your blisters change into scabs, your shingles infection can cause chickenpox in people who have never had it or have never been vaccinated against it. To prevent this from happening, avoid contact with other people, especially: ? Babies. ? Pregnant women. ? Children who have eczema. ? Elderly people who have transplants. ? People who have chronic illnesses, such as cancer or AIDS. Contact a health care provider if:  Your pain is not relieved with prescribed medicines.  Your pain does not get better after the rash heals.  You have signs of infection in the rash area, such as: ? More redness, swelling, or pain around the rash. ? Fluid or blood coming from the rash. ? The rash area feeling warm to the touch. ? Pus or a bad smell coming from the rash. Get help right away if:  The rash is on your face or nose.  You have facial pain, pain around your eye area, or loss of feeling on one side of your face.  You have difficulty seeing.  You have ear pain or have ringing in your ear.  You have a loss of taste.  Your condition gets worse. Summary  Shingles, which is also known as herpes zoster, is an infection that causes a painful skin rash and fluid-filled blisters.  This condition is diagnosed with a skin exam. Skin or fluid samples may be taken from the blisters and examined before the diagnosis is made.  Keep your rash covered with a loose bandage (dressing). Wear loose-fitting clothing to help ease the pain of material rubbing against the rash.  Before your blisters change into scabs, your shingles infection can  cause chickenpox in people who have never had it or have never been vaccinated against it. This information is not intended to replace advice given to you by your health care provider. Make sure you discuss any questions you have with your health care provider. Document Revised: 12/12/2018 Document Reviewed: 04/24/2017 Elsevier Patient Education  2020 [employer].

## 2019-11-20 NOTE — Progress Notes (Signed)
Virtual Visit via MyChart Note  I connected with  [name] on 11/20/19 at 10:30 AM EDT by the video enabled telemedicine application, MyChart, and verified that I am speaking with the correct person using two identifiers.   I introduced myself as a Designer, jewellery with the practice. We discussed the limitations of evaluation and management by telemedicine and the availability of in person appointments. The patient expressed understanding and agreed to proceed.  The patient is: at home I am: in the office  Subjective:    CC: bruising around eye and headache  HPI: Denise Craig is a 49 y.o. y/o female presenting via [facility] today for sudden onset of bruising around her left eye socket that came upon suddenly yesterday afternoon after coughing hard. She reports the redness appeared in the same area that she has a current shingles outbreak and minimal areas of petechiae just below the left eye on the cheek.  She reports that her left sclera was also reddened from the episode as well, but that has resolved.   She did see her eye doctor after the shingles diagnoses and was told the eye did not appear to be affected by the virus. She has some minimal vision changes that have been present since the outbreak started, but they are not any worse.   She feels that the shingles outbreak was improving, the vesicles had crusted over and appeared better, but she started feeling additional burning and tingling in her scalp again, similar to the sensation she felt when the outbreak first started. She notified the office and was put on a second dose of valacyclovir to see if this helped.   She also reports the onset of a headache located above her left eye proximal to the eyebrow that started yesterday morning before the bruising began. She describes the headache as dull and intermittent at 3/10 on the pain scale. She has been taking Tylenol and does not feel it is very helpful. She takes gabapentin at night  and is sleeping well. She does not report any new vision changes, loss or alteration of consciousness, rhinorrhea, numbness, weakness, chest pain, dizziness, syncope, fever, jaw pain, neck stiffness or soreness, nausea, vomiting, or sensitivity to light or sound. She has a history of migraines and reports this is not similar.   She has been using cool compresses to the eye with some relief. She does not feel the bruising or swelling is any worse than yesterday, however, her headache is still present.   Past medical history, Surgical history, Family history not pertinant except as noted below, Social history, Allergies, and medications have been entered into the medical record, reviewed, and corrections made.   Review of Systems:  See HPI for pertinent positive and negatives.  Objective:    General: Speaking clearly in complete sentences without any shortness of breath.  Alert and oriented x3.  Normal judgment. No apparent acute distress. Ecchymosis present around the left eye, worse superior and lateral to the eye. Dark purple in color.  Evidence of vesicular eruption on an erythematous base still present around the eye and across the forehead.   Impression and Recommendations:   1. Herpes zoster with complication Symptoms and presentation consistent with headache associated with cranial nerve irritation due to herpes zoster outbreak.  A second round of valacyclovir was started yesterday to help reduce symptoms. Will trial low dose prednisone to see if this will help with the inflammation and irritation to the nerve itself and provide some  relief. Discussed emergency symptoms that would require immediate hospital attention or a return to the eye doctor.  The patient does have an allergy listed to prednisone- she reports it can make her "angry". She is agreeable to trial the low dose and understands that she may stop the medication if she begins to have unwanted symptoms.  Patient to follow-up if  symptoms worsen or do not improve.  - predniSONE (DELTASONE) 20 MG tablet; Take 1 tablet (20 mg total) by mouth daily with breakfast for 5 days.  Dispense: 5 tablet; Refill: 0  2. Ecchymosis of left eye, initial encounter Appears to be superficial eruption of small blood vessels surrounding the eye due to increased pressure as a results of valsalva (coughing). Area is not growing in size. Continue to use cool compresses over the area of bruising and rest the eye as much as possible.  Discussed emergency symptoms that would warrant a visit to the emergency room or a return to the eye doctor.  Patient to follow-up if symptoms worsen or fail to improve.      I discussed the assessment and treatment plan with the patient. The patient was provided an opportunity to ask questions and all were answered. The patient agreed with the plan and demonstrated an understanding of the instructions.   The patient was advised to call back or seek an in-person evaluation if the symptoms worsen or if the condition fails to improve as anticipated.  I provided 20 minutes of non-face-to-face interaction with this [place] visit.   Orma Render, NP

## 2019-11-26 ENCOUNTER — Other Ambulatory Visit: Payer: Self-pay | Admitting: Physician Assistant

## 2019-12-09 ENCOUNTER — Other Ambulatory Visit: Payer: Self-pay | Admitting: Physician Assistant

## 2019-12-14 ENCOUNTER — Telehealth: Payer: Self-pay

## 2019-12-14 DIAGNOSIS — B028 Zoster with other complications: Secondary | ICD-10-CM

## 2019-12-14 MED ORDER — VALACYCLOVIR HCL 1 G PO TABS: 1000.0000 mg | ORAL_TABLET | Freq: Three times a day (TID) | ORAL | 0 refills | Status: DC

## 2019-12-14 NOTE — Telephone Encounter (Signed)
Pt aware Rx has been sent to her pharmacy for her. No further questions or concerns at this time.

## 2019-12-14 NOTE — Telephone Encounter (Signed)
Refill provided and sent to [facility] in [place].

## 2019-12-14 NOTE — Telephone Encounter (Signed)
Pt was dx with shingles on 11/12/2019 and it resolved. Pt states she is starting to have another outbreak now. She said that she feels it is due to the COVID vaccine. She got her 1st shot on 11/03/2019 and was dx on 11/12/2019. She got her 2nd shot on 12/02/2019 and is breaking out in blisters, itching, redness as of this morning. Pt is requesting a refill of valacyclovir 1000 mg.

## 2020-01-03 ENCOUNTER — Encounter: Payer: Self-pay | Admitting: Physician Assistant

## 2020-01-27 ENCOUNTER — Ambulatory Visit: Payer: 59 | Admitting: Physical Therapy

## 2020-02-02 ENCOUNTER — Other Ambulatory Visit: Payer: Self-pay

## 2020-02-02 ENCOUNTER — Ambulatory Visit: Payer: 59 | Admitting: Physical Therapy

## 2020-02-02 DIAGNOSIS — M545 Low back pain, unspecified: Secondary | ICD-10-CM

## 2020-02-02 DIAGNOSIS — G8929 Other chronic pain: Secondary | ICD-10-CM

## 2020-02-02 NOTE — Patient Instructions (Signed)
Access Code: [email or web address] URL: [email or web address] Date: 02/02/2020 Prepared by: Madelyn Flavors  Exercises Supine Quadratus Lumborum Stretch - 2 x daily - 7 x weekly - 1 sets - 5 reps - 20 sec hold Child's Pose Stretch - 2 x daily - 7 x weekly - 3 reps - 1 sets - 20-30 sec hold Child's Pose with Sidebending - 2 x daily - 7 x weekly - 3 reps - 1 sets - 20-30 sec hold Standing Shoulder and Trunk Flexion at Table - 2 x daily - 7 x weekly - 3 reps - 1 sets
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
## 2020-02-09 ENCOUNTER — Other Ambulatory Visit: Payer: Self-pay

## 2020-02-09 ENCOUNTER — Ambulatory Visit: Payer: 59 | Attending: Orthopedic Surgery

## 2020-02-09 DIAGNOSIS — G8929 Other chronic pain: Secondary | ICD-10-CM | POA: Insufficient documentation

## 2020-02-09 DIAGNOSIS — M545 Low back pain: Secondary | ICD-10-CM | POA: Diagnosis present

## 2020-02-09 NOTE — Therapy (Addendum)
[place], [place], [address] Phone: [phone]   Fax:  [phone]  Physical Therapy Treatment/Discharge  Patient Details  Name: Denise Craig MRN: [id number] Date of Birth: 11/02/1970 Referring Provider (PT): Melina Schools   Encounter Date: 02/09/2020  PT End of Session - 02/09/20 0702    Visit Number  2    Number of Visits  16    Date for PT Re-Evaluation  03/29/20    Authorization Type  UHC    PT Start Time  0703    PT Stop Time  0750    PT Time Calculation (min)  47 min    Activity Tolerance  Patient tolerated treatment well    Behavior During Therapy  [facility] for tasks assessed/performed       Past Medical History:  Diagnosis Date  . Anxiety   . Asthma    has not used inhaler in past 10 months-will bring dos  . B12 deficiency   . Depression   . GERD (gastroesophageal reflux disease)    w/ moderate hiatal hernia (EGD 3-09), Dr August Saucer prn, prilosec  . Infertility, female   . Iron deficiency   . Migraine    sleep problems- sees Dr Berdine Addison, EEG normal 12-07  . Nephrolithiasis   . PONV (postoperative nausea and vomiting)   . 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 Uterine polyp     Past Surgical History:  Procedure Laterality Date  . APPENDECTOMY    . endometrial ablation spring  2008  . GASTRIC BYPASS  2013  . HYSTEROSCOPY WITH NOVASURE  08/12/2012   Procedure: HYSTEROSCOPY WITH NOVASURE;  Surgeon: Marylynn Pearson, MD;  Location: [facility];  Service: Gynecology;  Laterality: N/A;  . LAPAROSCOPIC TUBAL LIGATION  08/12/2012   Procedure: LAPAROSCOPIC TUBAL LIGATION;  Surgeon: Marylynn Pearson, MD;  Location: [facility];  Service: Gynecology;  Laterality: Bilateral;  WITH FILSHIE CLIPS  . right knee lateral release surgery  2011  . TONSILLECTOMY    . tumor removed  2012   removed right ankle    There were no vitals filed for this visit.  Subjective Assessment - 02/09/20 0703     Subjective  She reports momre pain since eval/  Constant ache.  Traction  post she was sore but no more than normal.   She does  do  exercise but not worse .    Not sure why it is worse in past week.  .    Pain Score  4     Pain Location  Back    Pain Orientation  Lower    Pain Descriptors / Indicators  Numbness;Tightness;Tingling    Pain Onset  More than a month ago    Pain Frequency  Constant    Aggravating Factors   sitting    Pain Relieving Factors  nothing                        [facility] Adult PT Treatment/Exercise - 02/09/20 0001      Exercises   Exercises  Lumbar      Lumbar Exercises: Stretches   Single Knee to Chest Stretch  Right;Left;1 rep;20 seconds    Double Knee to Chest Stretch  1 rep;20 seconds    Lower Trunk Rotation Limitations  10 reps 3-5 sec    Pelvic Tilt  15 reps    Pelvic Tilt Limitations  much verbal and tactile cues.  Piriformis Stretch  Right;Left;1 rep;30 seconds    Other Lumbar Stretch Exercise  stand flexion stetch at the counter.       Lumbar Exercises: Supine   Pelvic Tilt Limitations  added head /shoulder lift      Traction   Type of Traction  Lumbar    Min (lbs)  30    Max (lbs)  60    Hold Time  60    Rest Time  20    Time  12             PT Education - 02/09/20 0742    Education Details  HEP    Person(s) Educated  Patient    Methods  Explanation;Demonstration;Tactile cues;Verbal cues    Comprehension  Verbalized understanding;Returned demonstration       PT Short Term Goals - 02/02/20 1135      PT SHORT TERM GOAL #1   Title  Ind with initial HEP    Time  2    Period  Weeks    Status  New    Target Date  02/16/20        PT Long Term Goals - 02/02/20 1136      PT LONG TERM GOAL #1   Title  Patient to report decreased tingling in BLE by 50% or more at the end of day.    Time  8    Period  Weeks    Status  New    Target Date  03/29/20      PT LONG TERM GOAL #2   Title  Patient independent with  HEP and techniques to manage back pain and LE symptoms during the work day.    Time  8    Period  Weeks    Status  New      PT LONG TERM GOAL #3   Title  Patient to report decreased back pain by 50% with ADLS including stair climbing.    Time  8    Period  Weeks    Status  New      PT LONG TERM GOAL #4   Title  Improved FOTO score to <=31% limitations    Time  8    Period  Weeks    Status  New            Plan - 02/09/20 1950    Clinical Impression Statement  No increased pain post sesstion and traction pull increased 10#.  aDDED 2 EXERCISES FOR ppT  and hip stretches.   Appears to need improved lower back flexion.  Will work core strength and flexions tretching.    PT Treatment/Interventions  ADLs/Self Care Home Management;Traction;Aquatic Therapy;Cryotherapy;Electrical Stimulation;Moist Heat;Therapeutic activities;Therapeutic exercise;Neuromuscular re-education;Manual techniques;Patient/family education;Dry needling;Joint Manipulations;Spinal Manipulations;Taping    PT Home Exercise Plan  HEWMYFYK   PPT , piriformis.    Consulted and Agree with Plan of Care  Patient       Patient will benefit from skilled therapeutic intervention in order to improve the following deficits and impairments:  Decreased range of motion, Pain, Decreased activity tolerance, Impaired flexibility, Decreased strength  Visit Diagnosis: Chronic midline low back pain, unspecified whether sciatica present     Problem List Patient Active Problem List   Diagnosis Date Noted  . Fidgeting 01/21/2018  . Chest tightness 01/21/2018  . No energy 01/21/2018  . Poor concentration 01/21/2018  . Menopause 09/24/2016  . Vitamin D deficiency 09/24/2016  . Low iron stores 09/24/2016  . Hot flashes 09/21/2016  .  Family history of breast cancer 09/21/2016  . At risk for impaired digestion 09/21/2016  . 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 Anxiety and depression 06/01/2016  . Periumbilical  abdominal pain 01/25/2016  . Nephrolithiasis 07/14/2013  . GERD (gastroesophageal reflux disease) 09/17/2012  . Essential hypertension 02/27/2011  . IBS 06/16/2009  . INSOMNIA 07/15/2008  . HIATAL HERNIA 12/22/2007  . THYROMEGALY 12/11/2007  . ANEMIA, VITAMIN B12 DEFICIENCY 06/04/2007  . Iron deficiency anemia 05/16/2007  . ASTHMA, PERSISTENT, MODERATE 05/16/2007  . OBESITY, NOS 06/11/2006  . Anxiety state 06/11/2006  . Migraine headache 06/11/2006  . ENDOMETRIOSIS 06/11/2006    Darrel Hoover  PT 02/09/2020, 7:42 AM  [facility] [address] [place], [place], [address] Phone: [phone]   Fax:  [phone]  Name: GUNDA MAQUEDA MRN: [id number] Date of Birth: 09-27-1970  PHYSICAL THERAPY DISCHARGE SUMMARY  Visits from Start of Care: @  Current functional level related to goals / functional outcomes: See above Unknown level of function. She did not return after this visit. Unsure if no other visits were scheduled    Remaining deficits: Unknown   Education / Equipment: HEP Plan:                                                    Patient goals were not met. Patient is being discharged due to not returning since the last visit.  ?????   Pearson Forster PT  03/14/20

## 2020-02-09 NOTE — Patient Instructions (Signed)
PPT  ,x 1015 2x/day hold 5 se, piriformis stretch 30 sec hold,  2x/day RT/LT

## 2020-02-15 ENCOUNTER — Other Ambulatory Visit: Payer: Self-pay | Admitting: Physician Assistant

## 2020-02-15 DIAGNOSIS — K219 Gastro-esophageal reflux disease without esophagitis: Secondary | ICD-10-CM

## 2020-02-28 ENCOUNTER — Other Ambulatory Visit: Payer: Self-pay | Admitting: Physician Assistant

## 2020-03-16 ENCOUNTER — Other Ambulatory Visit: Payer: Self-pay | Admitting: *Deleted

## 2020-03-16 MED ORDER — MONTELUKAST SODIUM 10 MG PO TABS: 10.0000 mg | ORAL_TABLET | Freq: Every day | ORAL | 3 refills | Status: DC

## 2020-04-12 ENCOUNTER — Encounter: Payer: Self-pay | Admitting: Nurse Practitioner

## 2020-04-12 ENCOUNTER — Ambulatory Visit (INDEPENDENT_AMBULATORY_CARE_PROVIDER_SITE_OTHER): Payer: 59 | Admitting: Nurse Practitioner

## 2020-04-12 VITALS — BP 110/73 | HR 78 | Temp 97.7°F | Ht 66.0 in | Wt 210.6 lb

## 2020-04-12 DIAGNOSIS — Z09 Encounter for follow-up examination after completed treatment for conditions other than malignant neoplasm: Secondary | ICD-10-CM | POA: Diagnosis not present

## 2020-04-12 DIAGNOSIS — R0602 Shortness of breath: Secondary | ICD-10-CM

## 2020-04-12 DIAGNOSIS — F32A Depression, unspecified: Secondary | ICD-10-CM

## 2020-04-12 DIAGNOSIS — J189 Pneumonia, unspecified organism: Secondary | ICD-10-CM

## 2020-04-12 DIAGNOSIS — F419 Anxiety disorder, unspecified: Secondary | ICD-10-CM

## 2020-04-12 DIAGNOSIS — F329 Major depressive disorder, single episode, unspecified: Secondary | ICD-10-CM

## 2020-04-12 DIAGNOSIS — J4541 Moderate persistent asthma with (acute) exacerbation: Secondary | ICD-10-CM | POA: Diagnosis not present

## 2020-04-12 MED ORDER — AZITHROMYCIN 250 MG PO TABS: ORAL_TABLET | ORAL | 0 refills | Status: DC

## 2020-04-12 MED ORDER — SALMETEROL XINAFOATE 50 MCG/DOSE IN AEPB: 1.0000 | INHALATION_SPRAY | Freq: Two times a day (BID) | RESPIRATORY_TRACT | 10 refills | Status: DC

## 2020-04-12 MED ORDER — ALPRAZOLAM 0.5 MG PO TABS: 0.5000 mg | ORAL_TABLET | Freq: Two times a day (BID) | ORAL | 0 refills | Status: DC | PRN

## 2020-04-12 MED ORDER — AMOXICILLIN-POT CLAVULANATE 875-125 MG PO TABS: 1.0000 | ORAL_TABLET | Freq: Two times a day (BID) | ORAL | 0 refills | Status: DC

## 2020-04-12 NOTE — Patient Instructions (Signed)
Community-Acquired Pneumonia, Adult Pneumonia is an infection of the lungs. It causes swelling in the airways of the lungs. Mucus and fluid may also build up inside the airways. One type of pneumonia can happen while a person is in a hospital. A different type can happen when a person is not in a hospital (community-acquired pneumonia).  What are the causes?  This condition is caused by germs (viruses, bacteria, or fungi). Some types of germs can be passed from one person to another. This can happen when you breathe in droplets from the cough or sneeze of an infected person. What increases the risk? You are more likely to develop this condition if you:  Have a long-term (chronic) disease, such as: ? Chronic obstructive pulmonary disease (COPD). ? Asthma. ? Cystic fibrosis. ? Congestive heart failure. ? Diabetes. ? Kidney disease.  Have HIV.  Have sickle cell disease.  Have had your spleen removed.  Do not take good care of your teeth and mouth (poor dental hygiene).  Have a medical condition that increases the risk of breathing in droplets from your own mouth and nose.  Have a weakened body defense system (immune system).  Are a smoker.  Travel to areas where the germs that cause this illness are common.  Are around certain animals or the places they live. What are the signs or symptoms?  A dry cough.  A wet (productive) cough.  Fever.  Sweating.  Chest pain. This often happens when breathing deeply or coughing.  Fast breathing or trouble breathing.  Shortness of breath.  Shaking chills.  Feeling tired (fatigue).  Muscle aches. How is this treated? Treatment for this condition depends on many things. Most adults can be treated at home. In some cases, treatment must happen in a hospital. Treatment may include:  Medicines given by mouth or through an IV tube.  Being given extra oxygen.  Respiratory therapy. In rare cases, treatment for very bad pneumonia  may include:  Using a machine to help you breathe.  Having a procedure to remove fluid from around your lungs. Follow these instructions at home: Medicines  Take over-the-counter and prescription medicines only as told by your doctor. ? Only take cough medicine if you are losing sleep.  If you were prescribed an antibiotic medicine, take it as told by your doctor. Do not stop taking the antibiotic even if you start to feel better. General instructions   Sleep with your head and neck raised (elevated). You can do this by sleeping in a recliner or by putting a few pillows under your head.  Rest as needed. Get at least 8 hours of sleep each night.  Drink enough water to keep your pee (urine) pale yellow.  Eat a healthy diet that includes plenty of vegetables, fruits, whole grains, low-fat dairy products, and lean protein.  Do not use any products that contain nicotine or tobacco. These include cigarettes, e-cigarettes, and chewing tobacco. If you need help quitting, ask your doctor.  Keep all follow-up visits as told by your doctor. This is important. How is this prevented? A shot (vaccine) can help prevent pneumonia. Shots are often suggested for:  People older than 49 years of age.  People older than 49 years of age who: ? Are having cancer treatment. ? Have long-term (chronic) lung disease. ? Have problems with their body's defense system. You may also prevent pneumonia if you take these actions:  Get the flu (influenza) shot every year.  Go to the dentist as   often as told.  Wash your hands often. If you cannot use soap and water, use hand sanitizer. Contact a doctor if:  You have a fever.  You lose sleep because your cough medicine does not help. Get help right away if:  You are short of breath and it gets worse.  You have more chest pain.  Your sickness gets worse. This is very serious if: ? You are an older adult. ? Your body's defense system is weak.  You  cough up blood. Summary  Pneumonia is an infection of the lungs.  Most adults can be treated at home. Some will need treatment in a hospital.  Drink enough water to keep your pee pale yellow.  Get at least 8 hours of sleep each night. This information is not intended to replace advice given to you by your health care provider. Make sure you discuss any questions you have with your health care provider. Document Revised: 12/10/2018 Document Reviewed: 04/17/2018 Elsevier Patient Education  2020 [employer]  

## 2020-04-12 NOTE — Progress Notes (Signed)
Established Patient Office Visit  Subjective:  Patient ID: Denise Craig, female    DOB: 08/05/1971  Age: 49 y.o. MRN: [id number]  CC:  Chief Complaint  Patient presents with  . Follow-up  . Shortness of Breath    HPI Denise Craig presents for hospital follow-up for shortness of breath.  She was seen in the ED 03/28/2020 for shortness of breath and chest tightness. VS were stable, EKG, d-dimer, COVID-19, Flu, RSV, and serial troponin negative.  Lab results did show elevation in leukocytes and some evidence of kidney damage. CXR showed left basilar opacity possibly representative of atelectasis vs. infiltrates.   Prednisone dose pack, albuterol, and Flovent prescribed by ED.   She reports that she does have symptom improvement now, but about 11-12 in the afternoon the symptoms of chest tightness and difficulty taking a deep breath return. Her symptoms continue through the day- occasionally they will improve as the day goes on.   She did attempt to mow the grass yesterday, but she was unable to complete this task as she reports that she felt as though she could not breath.   She describes the sensation as a tightness and weight on her chest and the sensation that her lungs are hurting and inability to take a deep breath.   She reports kidney damage was present before this illness due to frequent recurrent kidney stones.   She denies fevers, chills, rhinorrhea, post nasal drip.  She endorses dry cough, fatigue, decreased appetite.   ANXIETY She also reports that her anxiety has been worse than usual lately. She feels that the trentellix worked very well for a long time, but does not feel it is working quite like it has in the past. She has been using the buspar, but does not feel this is very helpful. She is concerned that the trentellix may not be working for her any longer and would like to consider possibly changing medications.   Past Medical History:  Diagnosis Date  . Anxiety    . Asthma    has not used inhaler in past 10 months-will bring dos  . B12 deficiency   . Depression   . GERD (gastroesophageal reflux disease)    w/ moderate hiatal hernia (EGD 3-09), Dr August Saucer prn, prilosec  . Infertility, female   . Iron deficiency   . Migraine    sleep problems- sees Dr Berdine Addison, EEG normal 12-07  . Nephrolithiasis   . PONV (postoperative nausea and vomiting)   . 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 Uterine polyp     Past Surgical History:  Procedure Laterality Date  . APPENDECTOMY    . endometrial ablation spring  2008  . GASTRIC BYPASS  2013  . HYSTEROSCOPY WITH NOVASURE  08/12/2012   Procedure: HYSTEROSCOPY WITH NOVASURE;  Surgeon: Marylynn Pearson, MD;  Location: [facility];  Service: Gynecology;  Laterality: N/A;  . LAPAROSCOPIC TUBAL LIGATION  08/12/2012   Procedure: LAPAROSCOPIC TUBAL LIGATION;  Surgeon: Marylynn Pearson, MD;  Location: [facility];  Service: Gynecology;  Laterality: Bilateral;  WITH FILSHIE CLIPS  . right knee lateral release surgery  2011  . TONSILLECTOMY    . tumor removed  2012   removed right ankle    Family History  Problem Relation Age of Onset  . Cancer Father        metastatic, colon ca  . Hypertension Mother   . Diabetes Mother   . Breast cancer Mother   . Cancer Mother  42       breast cancer  . Breast cancer Maternal Aunt   . Breast cancer Paternal Aunt   . Breast cancer Maternal Grandmother   . Breast cancer Paternal Grandmother     Social History   Socioeconomic History  . Marital status: Married    Spouse name: Not on file  . Number of children: Not on file  . Years of education: Not on file  . Highest education level: Not on file  Occupational History  . Not on file  Tobacco Use  . Smoking status: Never Smoker  . Smokeless tobacco: Never Used  Substance and Sexual Activity  . Alcohol use: No  . Drug use: No  . Sexual activity: Not on file  Other Topics Concern  . Not on file  Social History  Narrative  . Not on file   Social Determinants of Health   Financial Resource Strain:   . Difficulty of Paying Living Expenses:   Food Insecurity:   . Worried About Charity fundraiser in the Last Year:   . Arboriculturist in the Last Year:   Transportation Needs:   . Film/video editor (Medical):   Marland Kitchen Lack of Transportation (Non-Medical):   Physical Activity:   . Days of Exercise per Week:   . Minutes of Exercise per Session:   Stress:   . Feeling of Stress :   Social Connections:   . Frequency of Communication with Friends and Family:   . Frequency of Social Gatherings with Friends and Family:   . Attends Religious Services:   . Active Member of Clubs or Organizations:   . Attends Archivist Meetings:   Marland Kitchen Marital Status:   Intimate Partner Violence:   . Fear of Current or Ex-Partner:   . Emotionally Abused:   Marland Kitchen Physically Abused:   . Sexually Abused:     Outpatient Medications Prior to Visit  Medication Sig Dispense Refill  . ALPRAZolam (XANAX) 0.5 MG tablet TAKE 1 TABLET(0.5 MG) BY MOUTH AT BEDTIME AS NEEDED FOR ANXIETY 30 tablet 0  . busPIRone (BUSPAR) 10 MG tablet Take 1 tablet (10 mg total) by mouth 2 (two) times daily. 180 tablet 1  . Fremanezumab-vfrm (AJOVY) 225 MG/1.5ML SOAJ Inject 225 mg into the skin every 30 (thirty) days. 4.5 mL 1  . gabapentin (NEURONTIN) 100 MG capsule 100 mg (1 tab) - 300 mg (3 tabs) up to three times a day as needed for nerve pain (Patient not taking: Reported on 02/02/2020) 120 capsule 3  . lidocaine (XYLOCAINE) 5 % ointment Apply 1 application topically 4 (four) times daily as needed. (Patient not taking: Reported on 02/02/2020) 30 g 0  . lisinopril-hydrochlorothiazide (ZESTORETIC) 20-12.5 MG tablet TAKE 1 TABLET BY MOUTH  DAILY 90 tablet 3  . montelukast (SINGULAIR) 10 MG tablet Take 1 tablet (10 mg total) by mouth at bedtime. 90 tablet 3  . Multiple Vitamins-Minerals (MULTIVITAMIN WITH MINERALS) tablet Take 1 tablet by mouth  daily.     . ondansetron (ZOFRAN) 8 MG tablet Take 1 tablet (8 mg total) by mouth every 8 (eight) hours as needed for nausea or vomiting. 20 tablet 0  . pantoprazole (PROTONIX) 40 MG tablet TAKE 1 TABLET BY MOUTH  TWICE DAILY 180 tablet 3  . QUEtiapine (SEROQUEL) 25 MG tablet Take 1 tablet (25 mg total) by mouth at bedtime. 30 tablet 5  . rizatriptan (MAXALT) 10 MG tablet Take 1 tablet (10 mg total) by mouth as  needed for migraine. May repeat in 2 hours if needed 10 tablet 0  . valACYclovir (VALTREX) 1000 MG tablet Take 1 tablet (1,000 mg total) by mouth 3 (three) times daily. (Patient not taking: Reported on 02/02/2020) 21 tablet 0  . vortioxetine HBr (TRINTELLIX) 20 MG TABS tablet Take 1 tablet (20 mg total) by mouth daily. 90 tablet 1   No facility-administered medications prior to visit.    Allergies  Allergen Reactions  . Ambien [Zolpidem]     Fecal incontinence.   . Gabapentin     Couldn't concentrate  . Morphine And Related Nausea And Vomiting  . Prednisone Other (See Comments)    "I don't like how it makes me feel"        Objective:    Physical Exam Constitutional:      Appearance: She is well-developed.  HENT:     Head: Normocephalic.  Eyes:     Extraocular Movements: Extraocular movements intact.     Pupils: Pupils are equal, round, and reactive to light.  Cardiovascular:     Rate and Rhythm: Normal rate and regular rhythm.  No extrasystoles are present.    Heart sounds: No murmur heard.   Pulmonary:     Effort: Pulmonary effort is normal.     Breath sounds: Examination of the left-upper field reveals decreased breath sounds and rhonchi. Examination of the left-middle field reveals decreased breath sounds and rhonchi. Decreased breath sounds and rhonchi present. No wheezing.  Chest:     Chest wall: No tenderness or crepitus. There is dullness to percussion.     Comments: Dullness to percussion noted in the left upper and mid portion of the lung on the posterior  surface.  Abdominal:     General: Bowel sounds are normal.     Palpations: Abdomen is soft.  Musculoskeletal:        General: Normal range of motion.     Cervical back: Normal range of motion and neck supple.     Right lower leg: No edema.     Left lower leg: No edema.  Skin:    General: Skin is warm and dry.     Capillary Refill: Capillary refill takes less than 2 seconds.     Coloration: Skin is not cyanotic.  Neurological:     General: No focal deficit present.     Mental Status: She is alert and oriented to person, place, and time.  Psychiatric:        Mood and Affect: Mood normal. Mood is not anxious.        Behavior: Behavior normal.     Ht 5\' 6"  (1.676 m)   BMI 35.19 kg/m  Wt Readings from Last 3 Encounters:  11/12/19 218 lb 0.6 oz (98.9 kg)  09/02/19 209 lb (94.8 kg)  04/16/19 209 lb (94.8 kg)     Health Maintenance Due  Topic Date Due  . PAP SMEAR-Modifier  07/22/2017  . INFLUENZA VACCINE  04/03/2020    There are no preventive care reminders to display for this patient.  Lab Results  Component Value Date   TSH 2.31 01/24/2018   Lab Results  Component Value Date   WBC 6.2 04/07/2018   HGB 12.9 04/07/2018   HCT 38.9 04/07/2018   MCV 88.8 04/07/2018   PLT 265 04/07/2018   Lab Results  Component Value Date   NA 143 04/07/2018   K 2.9 (L) 04/07/2018   CO2 27 04/07/2018   GLUCOSE 98 04/07/2018   BUN  8 04/07/2018   CREATININE 0.67 04/07/2018   BILITOT 0.3 04/07/2018   ALKPHOS 60 04/07/2018   AST 23 04/07/2018   ALT 23 04/07/2018   PROT 5.9 (L) 04/07/2018   ALBUMIN 3.1 (L) 04/07/2018   CALCIUM 8.3 (L) 04/07/2018   ANIONGAP 7 04/07/2018   Lab Results  Component Value Date   CHOL 156 08/31/2015   Lab Results  Component Value Date   HDL 78 08/31/2015   Lab Results  Component Value Date   LDLCALC 65 08/31/2015   Lab Results  Component Value Date   TRIG 65 08/31/2015   Lab Results  Component Value Date   CHOLHDL 2.0 08/31/2015   No  results found for: HGBA1C    Assessment & Plan:   1. Hospital discharge follow-up Follow-up for hospital visit 03/28/2020.  Prednisone burst complete. Continuing Flovent (fluticasone) and Albuterol - has only been on the Flovent for about 5 days.  Symptoms are a little better with the steroid burst and the addition of the corticosteroid inhaler, but not improved significantly. She is continuing to have symptoms of respiratory distress and chest heaviness.  Based on the chest x-ray results from the hospital and the continued symptoms despite management with inhaled and oral steroids, I do feel that adding antibiotics for CAP may be beneficial for the patient.  Information provided on CAP and added LABA inhaler to treatment regimen.    2. Shortness of breath 4. Moderate persistent asthma with acute exacerbation Shortness of breath continues.  Continue albuterol as needed every 4-6 hours Continue fluticasone proprionate twice a day Start salmeterol twice a day Deep breathing and coughing exercises Avoid the heat Prop yourself up to sleep with pillows, if needed - salmeterol (SEREVENT) 50 MCG/DOSE diskus inhaler; Inhale 1 puff into the lungs in the morning and at bedtime.  Dispense: 28 each; Refill: 10  3. Community acquired pneumonia of left upper lobe of lung Symptoms and presentation consistent with CAP. She continues to have symptoms despite conservative treatment without antibiotic therapy.  CXR from ED reveals infiltrates present in the left lung.  Based on assessment today and continued symptomology, will add salmeterol to daily inhalers and treat with antibiotics for CAP. Continue albuterol and fluticasone propionate inhalers.  Rest and ensure you are drinking plenty of fluids Avoid the heat - salmeterol (SEREVENT) 50 MCG/DOSE diskus inhaler; Inhale 1 puff into the lungs in the morning and at bedtime.  Dispense: 28 each; Refill: 10 - azithromycin (ZITHROMAX) 250 MG tablet; Take 2  tabs (500 mg) together on the first day, then 1 tab (250 mg) daily until prescription complete.  Dispense: 10 tablet; Refill: 0 - amoxicillin-clavulanate (AUGMENTIN) 875-125 MG tablet; Take 1 tablet by mouth 2 (two) times daily.  Dispense: 10 tablet; Refill: 0  5. Anxiety and depression Anxiety does seem to be exacerbated and could most certainly contribute to her respiratory symptoms, although I do not feel this is the underlying cause.  Will discuss with PCP the option of changing her daily medication and refill her as needed xanax today.  Plan to follow-up with PCP to discuss changes in daily medications once respiratory symptoms are under better control.  - ALPRAZolam (XANAX) 0.5 MG tablet; Take 1 tablet (0.5 mg total) by mouth 2 (two) times daily as needed for anxiety.  Dispense: 30 tablet; Refill: 0  Follow up for breathing if symptoms worsen or fail to improve.  Follow up for Mood once breathing symptoms have improved.   Orma Render, NP

## 2020-04-29 ENCOUNTER — Other Ambulatory Visit: Payer: Self-pay | Admitting: Physician Assistant

## 2020-04-29 DIAGNOSIS — G43009 Migraine without aura, not intractable, without status migrainosus: Secondary | ICD-10-CM

## 2020-05-22 ENCOUNTER — Other Ambulatory Visit: Payer: Self-pay | Admitting: Physician Assistant

## 2020-05-22 DIAGNOSIS — F32A Depression, unspecified: Secondary | ICD-10-CM

## 2020-05-22 DIAGNOSIS — F419 Anxiety disorder, unspecified: Secondary | ICD-10-CM

## 2020-05-23 ENCOUNTER — Other Ambulatory Visit: Payer: Self-pay | Admitting: Physician Assistant

## 2020-05-31 ENCOUNTER — Encounter: Payer: Self-pay | Admitting: Nurse Practitioner

## 2020-05-31 DIAGNOSIS — F32A Depression, unspecified: Secondary | ICD-10-CM

## 2020-05-31 DIAGNOSIS — F419 Anxiety disorder, unspecified: Secondary | ICD-10-CM

## 2020-05-31 MED ORDER — BUSPIRONE HCL 10 MG PO TABS: 10.0000 mg | ORAL_TABLET | Freq: Two times a day (BID) | ORAL | 0 refills | Status: DC

## 2020-08-08 ENCOUNTER — Other Ambulatory Visit: Payer: Self-pay | Admitting: Physician Assistant

## 2020-08-08 DIAGNOSIS — G43009 Migraine without aura, not intractable, without status migrainosus: Secondary | ICD-10-CM

## 2020-08-23 DIAGNOSIS — M25561 Pain in right knee: Secondary | ICD-10-CM | POA: Insufficient documentation

## 2020-09-06 ENCOUNTER — Other Ambulatory Visit: Payer: Self-pay | Admitting: Physician Assistant

## 2020-09-06 DIAGNOSIS — F32A Depression, unspecified: Secondary | ICD-10-CM

## 2020-09-21 ENCOUNTER — Ambulatory Visit: Payer: 59 | Admitting: Physician Assistant

## 2020-09-21 ENCOUNTER — Encounter: Payer: Self-pay | Admitting: Physician Assistant

## 2020-09-21 ENCOUNTER — Other Ambulatory Visit: Payer: Self-pay

## 2020-09-21 VITALS — BP 122/71 | HR 81 | Wt 219.8 lb

## 2020-09-21 DIAGNOSIS — F5101 Primary insomnia: Secondary | ICD-10-CM

## 2020-09-21 DIAGNOSIS — Z1329 Encounter for screening for other suspected endocrine disorder: Secondary | ICD-10-CM

## 2020-09-21 DIAGNOSIS — F419 Anxiety disorder, unspecified: Secondary | ICD-10-CM

## 2020-09-21 DIAGNOSIS — I10 Essential (primary) hypertension: Secondary | ICD-10-CM

## 2020-09-21 DIAGNOSIS — Z13220 Encounter for screening for lipoid disorders: Secondary | ICD-10-CM | POA: Diagnosis not present

## 2020-09-21 DIAGNOSIS — F331 Major depressive disorder, recurrent, moderate: Secondary | ICD-10-CM | POA: Insufficient documentation

## 2020-09-21 DIAGNOSIS — Z131 Encounter for screening for diabetes mellitus: Secondary | ICD-10-CM | POA: Diagnosis not present

## 2020-09-21 DIAGNOSIS — F32A Depression, unspecified: Secondary | ICD-10-CM

## 2020-09-21 MED ORDER — MIRTAZAPINE 15 MG PO TABS: ORAL_TABLET | ORAL | 2 refills | Status: DC

## 2020-09-21 MED ORDER — VORTIOXETINE HBR 20 MG PO TABS
ORAL_TABLET | ORAL | 3 refills | Status: DC
Start: 2020-09-21 — End: 2021-10-04

## 2020-09-21 MED ORDER — BUSPIRONE HCL 15 MG PO TABS: 15.0000 mg | ORAL_TABLET | Freq: Two times a day (BID) | ORAL | 1 refills | Status: DC

## 2020-09-21 NOTE — Patient Instructions (Addendum)
[facility] genetic testing Or  Trans magnetic Stimulation   Health Maintenance, Female Adopting a healthy lifestyle and getting preventive care are important in promoting health and wellness. Ask your health care provider about:  The right schedule for you to have regular tests and exams.  Things you can do on your own to prevent diseases and keep yourself healthy. What should I know about diet, weight, and exercise? Eat a healthy diet  Eat a diet that includes plenty of vegetables, fruits, low-fat dairy products, and lean protein.  Do not eat a lot of foods that are high in solid fats, added sugars, or sodium.   Maintain a healthy weight Body mass index (BMI) is used to identify weight problems. It estimates body fat based on height and weight. Your health care provider can help determine your BMI and help you achieve or maintain a healthy weight. Get regular exercise Get regular exercise. This is one of the most important things you can do for your health. Most adults should:  Exercise for at least 150 minutes each week. The exercise should increase your heart rate and make you sweat (moderate-intensity exercise).  Do strengthening exercises at least twice a week. This is in addition to the moderate-intensity exercise.  Spend less time sitting. Even light physical activity can be beneficial. Watch cholesterol and blood lipids Have your blood tested for lipids and cholesterol at 50 years of age, then have this test every 5 years. Have your cholesterol levels checked more often if:  Your lipid or cholesterol levels are high.  You are older than 50 years of age.  You are at high risk for heart disease. What should I know about cancer screening? Depending on your health history and family history, you may need to have cancer screening at various ages. This may include screening for:  Breast cancer.  Cervical cancer.  Colorectal cancer.  Skin cancer.  Lung cancer. What  should I know about heart disease, diabetes, and high blood pressure? Blood pressure and heart disease  High blood pressure causes heart disease and increases the risk of stroke. This is more likely to develop in people who have high blood pressure readings, are of African descent, or are overweight.  Have your blood pressure checked: ? Every 3-5 years if you are 76-30 years of age. ? Every year if you are 65 years old or older. Diabetes Have regular diabetes screenings. This checks your fasting blood sugar level. Have the screening done:  Once every three years after age 24 if you are at a normal weight and have a low risk for diabetes.  More often and at a younger age if you are overweight or have a high risk for diabetes. What should I know about preventing infection? Hepatitis B If you have a higher risk for hepatitis B, you should be screened for this virus. Talk with your health care provider to find out if you are at risk for hepatitis B infection. Hepatitis C Testing is recommended for:  Everyone born from [birth year] through 1965.  Anyone with known risk factors for hepatitis C. Sexually transmitted infections (STIs)  Get screened for STIs, including gonorrhea and chlamydia, if: ? You are sexually active and are younger than 50 years of age. ? You are older than 50 years of age and your health care provider tells you that you are at risk for this type of infection. ? Your sexual activity has changed since you were last screened, and you are at increased  risk for chlamydia or gonorrhea. Ask your health care provider if you are at risk.  Ask your health care provider about whether you are at high risk for HIV. Your health care provider may recommend a prescription medicine to help prevent HIV infection. If you choose to take medicine to prevent HIV, you should first get tested for HIV. You should then be tested every 3 months for as long as you are taking the medicine. Pregnancy  If  you are about to stop having your period (premenopausal) and you may become pregnant, seek counseling before you get pregnant.  Take 400 to 800 micrograms (mcg) of folic acid every day if you become pregnant.  Ask for birth control (contraception) if you want to prevent pregnancy. Osteoporosis and menopause Osteoporosis is a disease in which the bones lose minerals and strength with aging. This can result in bone fractures. If you are 19 years old or older, or if you are at risk for osteoporosis and fractures, ask your health care provider if you should:  Be screened for bone loss.  Take a calcium or vitamin D supplement to lower your risk of fractures.  Be given hormone replacement therapy (HRT) to treat symptoms of menopause. Follow these instructions at home: Lifestyle  Do not use any products that contain nicotine or tobacco, such as cigarettes, e-cigarettes, and chewing tobacco. If you need help quitting, ask your health care provider.  Do not use street drugs.  Do not share needles.  Ask your health care provider for help if you need support or information about quitting drugs. Alcohol use  Do not drink alcohol if: ? Your health care provider tells you not to drink. ? You are pregnant, may be pregnant, or are planning to become pregnant.  If you drink alcohol: ? Limit how much you use to 0-1 drink a day. ? Limit intake if you are breastfeeding.  Be aware of how much alcohol is in your drink. In the [place] one drink equals one 12 oz bottle of beer (355 mL), one 5 oz glass of wine (148 mL), or one 1 oz glass of hard liquor (44 mL). General instructions  Schedule regular health, dental, and eye exams.  Stay current with your vaccines.  Tell your health care provider if: ? You often feel depressed. ? You have ever been abused or do not feel safe at home. Summary  Adopting a healthy lifestyle and getting preventive care are important in promoting health and  wellness.  Follow your health care provider's instructions about healthy diet, exercising, and getting tested or screened for diseases.  Follow your health care provider's instructions on monitoring your cholesterol and blood pressure. This information is not intended to replace advice given to you by your health care provider. Make sure you discuss any questions you have with your health care provider. Document Revised: 08/13/2018 Document Reviewed: 08/13/2018 Elsevier Patient Education  2021 [employer].

## 2020-09-21 NOTE — Progress Notes (Signed)
Subjective:    Patient ID: Denise Craig, female    DOB: 12-11-1970, 50 y.o.   MRN: [id number]  HPI  Patient is a 50 year old obese female with MDD, GAD, insomnia, hypertension who presents to the clinic for medication refill.  Due to the COVID-19 pandemic patient has not been seen in person in quite a while.  Patient has had some struggle with her anxiety and depression over the past year.  She is stable today.  She denies any suicidal thoughts or homicidal idealizations.  She does endorse some continued problems going to sleep and staying asleep as well as irritability.  She does not feel like her medications are where she wants them to be.  She has tried many SSRIs, SS and RI's, atypical antipsychotics in the past and for one reason or another not tolerated or were not effective.  This combination has been the best of those options.  She is getting ready to make a major job change.  She is hoping this will decrease stress in her life and also help with sleep since she will not be working 12 hours anymore.     .. Active Ambulatory Problems    Diagnosis Date Noted  . THYROMEGALY 12/11/2007  . OBESITY, NOS 06/11/2006  . Iron deficiency anemia 05/16/2007  . ANEMIA, VITAMIN B12 DEFICIENCY 06/04/2007  . Anxiety state 06/11/2006  . Migraine headache 06/11/2006  . ASTHMA, PERSISTENT, MODERATE 05/16/2007  . HIATAL HERNIA 12/22/2007  . IBS 06/16/2009  . ENDOMETRIOSIS 06/11/2006  . INSOMNIA 07/15/2008  . Essential hypertension 02/27/2011  . GERD (gastroesophageal reflux disease) 09/17/2012  . Nephrolithiasis 07/14/2013  . Periumbilical abdominal pain 01/25/2016  . 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 Anxiety and depression 06/01/2016  . Hot flashes 09/21/2016  . Family history of breast cancer 09/21/2016  . At risk for impaired digestion 09/21/2016  . Menopause 09/24/2016  . Vitamin D deficiency 09/24/2016  . Low iron stores 09/24/2016  . Fidgeting 01/21/2018  . Chest  tightness 01/21/2018  . No energy 01/21/2018  . Poor concentration 01/21/2018   Resolved Ambulatory Problems    Diagnosis Date Noted  . Leg edema 01/02/2011   Past Medical History:  Diagnosis Date  . Anxiety   . Asthma   . B12 deficiency   . Depression   . Infertility, female   . Iron deficiency   . Migraine   . PONV (postoperative nausea and vomiting)   . 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 Uterine polyp      Review of Systems  All other systems reviewed and are negative.      Objective:   Physical Exam Vitals reviewed.  Constitutional:      Appearance: Normal appearance.  HENT:     Head: Normocephalic and atraumatic.  Neck:     Vascular: No carotid bruit.  Cardiovascular:     Rate and Rhythm: Normal rate and regular rhythm.     Pulses: Normal pulses.     Heart sounds: Normal heart sounds.  Pulmonary:     Effort: Pulmonary effort is normal.     Breath sounds: Normal breath sounds.  Musculoskeletal:     Right lower leg: No edema.     Left lower leg: No edema.  Neurological:     General: No focal deficit present.     Mental Status: She is alert and oriented to person, place, and time.  Psychiatric:        Mood and Affect: Mood normal.  Behavior: Behavior normal.       .. Depression screen [facility] 2/9 09/02/2019 01/14/2019 08/05/2018 03/23/2018 01/21/2018  Decreased Interest 1 0 '2 1 1  ' Down, Depressed, Hopeless 0 1 1 0 2  PHQ - 2 Score '1 1 3 1 3  ' Altered sleeping '2 2 1 3 3  ' Tired, decreased energy '2 2 2 3 3  ' Change in appetite 0 0 1 0 2  Feeling bad or failure about yourself  1 0 1 0 0  Trouble concentrating 0 1 1 0 1  Moving slowly or fidgety/restless '1 1 1 ' 0 1  Suicidal thoughts 0 0 0 0 0  PHQ-9 Score '7 7 10 7 13  ' Difficult doing work/chores Somewhat difficult Somewhat difficult Somewhat difficult Not difficult at all Somewhat difficult   .Marland Kitchen GAD 7 : Generalized Anxiety Score 09/02/2019 01/14/2019 08/05/2018 03/23/2018  Nervous, Anxious, on Edge '1 1 2  2  ' Control/stop worrying '2 2 1 2  ' Worry too much - different things '2 2 1 2  ' Trouble relaxing '1 2 1 1  ' Restless '1 1 2 1  ' Easily annoyed or irritable '2 3 2 1  ' Afraid - awful might happen 0 0 0 1  Total GAD 7 Score '9 11 9 10  ' Anxiety Difficulty Somewhat difficult Somewhat difficult Somewhat difficult Somewhat difficult        Assessment & Plan:  Marland KitchenMarland KitchenDiagnoses and all orders for this visit:  Anxiety and depression -     vortioxetine HBr (TRINTELLIX) 20 MG TABS tablet; TAKE 1 TABLET BY MOUTH ONCE DAILY .  Primary insomnia  Screening for lipid disorders -     Lipid Panel w/reflex Direct LDL  Screening for diabetes mellitus -     COMPLETE METABOLIC PANEL WITH GFR  Thyroid disorder screen -     TSH -     CBC with Differential/Platelet  Other orders -     mirtazapine (REMERON) 15 MG tablet; Take 1-2 tablets about 1 hour before bedtime. -     busPIRone (BUSPAR) 15 MG tablet; Take 1 tablet (15 mg total) by mouth 2 (two) times daily.   PHQ and GAD numbers are fairly stable patient is switching to a daytime job and less stress.  She is hopeful this will help her mood.  She has tried many antidepressants in the past and I am hesitant to make major changes to these at this time.  If she does wish to change I would like for her to consider GeneSight testing to know what the best option for change would be.  She will think about this.  Also mention for her MDD transmagnetic stimulation.  I will give her some information on this if she would like to try.  In the meantime we did increase her BuSpar to 15 mg twice a day for her irritability.  She continues to have trouble with sleeping.  Stop trazodone and try Remeron.  1 to 2 tablets 1 hour before bedtime.  Spent a lot of time discussing sleep routine and benefits with better sleep.  Patient has had her colonoscopy and mammogram.  She also has some genetic testing.  She reports she is positive for a colon cancer gene.  We will hope to get records  in the next few weeks.  She was negative for BRCA gene per patient.  Follow up in 3 months.

## 2020-09-22 ENCOUNTER — Other Ambulatory Visit: Payer: Self-pay | Admitting: Neurology

## 2020-09-22 DIAGNOSIS — N289 Disorder of kidney and ureter, unspecified: Secondary | ICD-10-CM

## 2020-09-22 LAB — COMPLETE METABOLIC PANEL WITH GFR
AG Ratio: 1.7 (calc) (ref 1.0–2.5)
ALT: 25 U/L (ref 6–29)
AST: 26 U/L (ref 10–35)
Albumin: 4.1 g/dL (ref 3.6–5.1)
Alkaline phosphatase (APISO): 68 U/L (ref 31–125)
BUN/Creatinine Ratio: 15 (calc) (ref 6–22)
BUN: 20 mg/dL (ref 7–25)
CO2: 25 mmol/L (ref 20–32)
Calcium: 9.2 mg/dL (ref 8.6–10.2)
Chloride: 109 mmol/L (ref 98–110)
Creat: 1.33 mg/dL — ABNORMAL HIGH (ref 0.50–1.10)
GFR, Est African American: 54 mL/min/{1.73_m2} — ABNORMAL LOW (ref >=60)
GFR, Est Non African American: 47 mL/min/{1.73_m2} — ABNORMAL LOW (ref >=60)
Globulin: 2.4 g/dL (calc) (ref 1.9–3.7)
Glucose, Bld: 94 mg/dL (ref 65–139)
Potassium: 4.4 mmol/L (ref 3.5–5.3)
Sodium: 141 mmol/L (ref 135–146)
Total Bilirubin: 0.3 mg/dL (ref 0.2–1.2)
Total Protein: 6.5 g/dL (ref 6.1–8.1)

## 2020-09-22 LAB — LIPID PANEL W/REFLEX DIRECT LDL
Cholesterol: 209 mg/dL — ABNORMAL HIGH (ref <200)
HDL: 81 mg/dL (ref >=50)
LDL Cholesterol (Calc): 111 mg/dL (calc) — ABNORMAL HIGH
Non-HDL Cholesterol (Calc): 128 mg/dL (calc) (ref <130)
Total CHOL/HDL Ratio: 2.6 (calc) (ref <5.0)
Triglycerides: 81 mg/dL (ref <150)

## 2020-09-22 LAB — CBC WITH DIFFERENTIAL/PLATELET
Absolute Monocytes: 416 cells/uL (ref 200–950)
Basophils Absolute: 72 cells/uL (ref 0–200)
Basophils Relative: 1.8 %
Eosinophils Absolute: 192 cells/uL (ref 15–500)
Eosinophils Relative: 4.8 %
HCT: 35.5 % (ref 35.0–45.0)
Hemoglobin: 11.6 g/dL — ABNORMAL LOW (ref 11.7–15.5)
Lymphs Abs: 1012 cells/uL (ref 850–3900)
MCH: 28.7 pg (ref 27.0–33.0)
MCHC: 32.7 g/dL (ref 32.0–36.0)
MCV: 87.9 fL (ref 80.0–100.0)
MPV: 11.5 fL (ref 7.5–12.5)
Monocytes Relative: 10.4 %
Neutro Abs: 2308 cells/uL (ref 1500–7800)
Neutrophils Relative %: 57.7 %
Platelets: 192 10*3/uL (ref 140–400)
RBC: 4.04 10*6/uL (ref 3.80–5.10)
RDW: 14 % (ref 11.0–15.0)
Total Lymphocyte: 25.3 %
WBC: 4 10*3/uL (ref 3.8–10.8)

## 2020-09-22 LAB — TSH: TSH: 2.25 mIU/L

## 2020-09-22 NOTE — Progress Notes (Signed)
Kerby Nora cholesterol great. Bad cholesterol a little above optimal but still ok.  Liver looks great. Glucose looks good.  Your kidney function is down. Have you been drinking enough? Have you been taking a lot of anti-inflammatories such as ibuprofen/aleve/motrin? We need to recheck this in 2 weeks to see if improves on its on.  Thyroid good.  You are anemic. Making sure you have a MVI with iron could help.

## 2020-09-26 ENCOUNTER — Encounter: Payer: Self-pay | Admitting: Physician Assistant

## 2020-09-28 ENCOUNTER — Telehealth: Payer: Self-pay | Admitting: Neurology

## 2020-09-28 NOTE — Telephone Encounter (Signed)
Received note back from [facility], patient does not have [place] listed as PCP. She will need to sign a release to get her records from them for her PAP.

## 2020-10-25 ENCOUNTER — Other Ambulatory Visit: Payer: Self-pay | Admitting: Physician Assistant

## 2020-11-07 ENCOUNTER — Other Ambulatory Visit: Payer: Self-pay | Admitting: Physician Assistant

## 2020-11-07 DIAGNOSIS — G43009 Migraine without aura, not intractable, without status migrainosus: Secondary | ICD-10-CM

## 2020-11-21 ENCOUNTER — Telehealth: Payer: Self-pay

## 2020-11-21 NOTE — Telephone Encounter (Signed)
Notification through [employer]mymeds that a PA was needed for patient's Ajovy '225mg'$ /1.76m.  PA completed and submitted; awaiting response.

## 2020-11-23 NOTE — Telephone Encounter (Signed)
Received denial. "The requested medication and/or diagnosis are not a covered benefit and are excluded from coverage in accordance with the terms and conditions of your plan benefit. Therefore, this request has been administratively denied."

## 2021-01-17 ENCOUNTER — Other Ambulatory Visit: Payer: Self-pay | Admitting: Physician Assistant

## 2021-01-17 ENCOUNTER — Other Ambulatory Visit: Payer: Self-pay | Admitting: Family Medicine

## 2021-01-17 DIAGNOSIS — K219 Gastro-esophageal reflux disease without esophagitis: Secondary | ICD-10-CM

## 2021-01-31 ENCOUNTER — Other Ambulatory Visit: Payer: Self-pay | Admitting: Physician Assistant

## 2021-02-24 ENCOUNTER — Emergency Department (INDEPENDENT_AMBULATORY_CARE_PROVIDER_SITE_OTHER): Payer: 59

## 2021-02-24 ENCOUNTER — Telehealth: Payer: Self-pay | Admitting: Neurology

## 2021-02-24 ENCOUNTER — Other Ambulatory Visit: Payer: Self-pay

## 2021-02-24 ENCOUNTER — Emergency Department
Admission: EM | Admit: 2021-02-24 | Discharge: 2021-02-24 | Disposition: A | Discharge status: Home / Self Care | Payer: 59 | Source: Home / Self Care

## 2021-02-24 DIAGNOSIS — M542 Cervicalgia: Secondary | ICD-10-CM

## 2021-02-24 DIAGNOSIS — M62838 Other muscle spasm: Secondary | ICD-10-CM

## 2021-02-24 DIAGNOSIS — M25511 Pain in right shoulder: Secondary | ICD-10-CM

## 2021-02-24 DIAGNOSIS — G4486 Cervicogenic headache: Secondary | ICD-10-CM | POA: Diagnosis not present

## 2021-02-24 MED ORDER — BACLOFEN 10 MG PO TABS: 10.0000 mg | ORAL_TABLET | Freq: Three times a day (TID) | ORAL | 0 refills | Status: DC

## 2021-02-24 MED ORDER — KETOROLAC TROMETHAMINE 60 MG/2ML IM SOLN
60.0000 mg | Freq: Once | INTRAMUSCULAR | Status: AC
  Administered 2021-02-24: 60 mg via INTRAMUSCULAR

## 2021-02-24 MED ORDER — RIZATRIPTAN BENZOATE 10 MG PO TABS: 10.0000 mg | ORAL_TABLET | ORAL | 0 refills | Status: DC | PRN

## 2021-02-24 NOTE — ED Triage Notes (Signed)
Pt c/o headache x 3 days. Headache has been intermittent. Intermittent nausea. Sometimes takes Maxalt but is out of script. Aleve and Imitrex prn with no relief. Pain 7/10

## 2021-02-24 NOTE — ED Provider Notes (Signed)
[name]    CSN: [id number] Arrival date & time: 02/24/21  1218      History   Chief Complaint Chief Complaint  Patient presents with   Headache    HPI Denise Craig is a 50 y.o. female.   HPI 50 year old female presents with headache for 3 days.  Reports headache has been intermittent with intermittent nausea.  Patient has history of migraine and is on Maxalt; however, is currently out of this medication and is contacted PCP for refill.  Patient reports Aleve and Imitrex, as needed has provided little to no relief.  Upon initial exam patient believes headache pain is coming from right posterior neck area.  Patient reports when migraines are present they are typically bilateral frontal.   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   Uterine polyp     Patient Active Problem List   Diagnosis Date Noted   Moderate episode of recurrent major depressive disorder ([place]) 09/21/2020   Fidgeting 01/21/2018   Chest tightness 01/21/2018   No energy 01/21/2018   Poor concentration 01/21/2018   Menopause 09/24/2016   Vitamin D deficiency 09/24/2016   Low iron stores 09/24/2016   Hot flashes 09/21/2016   Family history of breast cancer 09/21/2016   At risk for impaired digestion 09/21/2016   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   Anxiety and depression [id number]   Periumbilical abdominal pain 01/25/2016   Nephrolithiasis 07/14/2013   GERD (gastroesophageal reflux disease) 09/17/2012   Essential hypertension 02/27/2011   IBS  06/16/2009   INSOMNIA 07/15/2008   HIATAL HERNIA 12/22/2007   THYROMEGALY 12/11/2007   ANEMIA, VITAMIN B12 DEFICIENCY 06/04/2007   Iron deficiency anemia 05/16/2007   ASTHMA, PERSISTENT, MODERATE 05/16/2007   OBESITY, NOS 06/11/2006   Anxiety state 06/11/2006   Migraine headache 06/11/2006   ENDOMETRIOSIS 06/11/2006    Past Surgical History:  Procedure Laterality Date   APPENDECTOMY     endometrial ablation spring  2008   GASTRIC BYPASS  2013   HYSTEROSCOPY WITH NOVASURE  08/12/2012   Procedure: HYSTEROSCOPY WITH NOVASURE;  Surgeon: Marylynn Pearson, MD;  Location: [facility];  Service: Gynecology;  Laterality: N/A;   LAPAROSCOPIC TUBAL LIGATION  08/12/2012   Procedure: LAPAROSCOPIC TUBAL LIGATION;  Surgeon: Marylynn Pearson, MD;  Location: [facility];  Service: Gynecology;  Laterality: Bilateral;  WITH FILSHIE CLIPS   right knee lateral release surgery  2011   TONSILLECTOMY     tumor removed  2012   removed right ankle    OB History   No obstetric history on file.      Home Medications    Prior to Admission medications   Medication Sig Start Date End Date Taking? Authorizing Provider  baclofen (LIORESAL) 10 MG tablet Take 1 tablet (10 mg total) by mouth 3 (three) times daily. 02/24/21  Yes Eliezer Lofts, FNP  rizatriptan (MAXALT) 10 MG tablet Take 1 tablet (10 mg total) by mouth as needed for migraine. May repeat  in 2 hours if needed 02/24/21  Yes Eliezer Lofts, FNP  SUMAtriptan (IMITREX) 100 MG tablet Take 100 mg by mouth every 2 (two) hours as needed for migraine. May repeat in 2 hours if headache persists or recurs.   Yes [provider]  albuterol (VENTOLIN HFA) 108 (90 Base) MCG/ACT inhaler Inhale 2 puffs into the lungs every 4 (four) hours as needed. 03/27/20   [provider]  ALPRAZolam Duanne Moron) 0.5 MG tablet Take 1 tablet (0.5 mg total) by mouth 2 (two) times daily as needed for anxiety. 04/12/20   Orma Render, NP  busPIRone (BUSPAR) 15 MG tablet Take 1  tablet (15 mg total) by mouth 2 (two) times daily. 09/21/20   Breeback, Royetta Car, PA-C  Fremanezumab-vfrm (AJOVY) 225 MG/1.5ML SOAJ INJECT '225MG'$  INTO THE SKIN EVERY 30 DAYS 11/07/20   Breeback, Jade L, PA-C  gabapentin (NEURONTIN) 300 MG capsule Take 300 mg by mouth 3 (three) times daily. 02/10/20   [provider]  lisinopril-hydrochlorothiazide (ZESTORETIC) 20-12.5 MG tablet TAKE 1 TABLET BY MOUTH  DAILY 10/26/20   Breeback, Jade L, PA-C  mirtazapine (REMERON) 15 MG tablet TAKE 1 TO 2 TABLETS BY MOUTH ABOUT 1 HOUR BEFORE BEDTIME/ APPT FOR REFILLS 02/01/21   Breeback, Jade L, PA-C  montelukast (SINGULAIR) 10 MG tablet TAKE 1 TABLET BY MOUTH AT  BEDTIME 01/19/21   Breeback, Jade L, PA-C  Multiple Vitamins-Minerals (MULTIVITAMIN WITH MINERALS) tablet Take 1 tablet by mouth daily.     [provider]  ondansetron (ZOFRAN) 8 MG tablet Take 1 tablet (8 mg total) by mouth every 8 (eight) hours as needed for nausea or vomiting. 04/07/18   Daleen Bo, MD  pantoprazole (PROTONIX) 40 MG tablet TAKE 1 TABLET BY MOUTH  TWICE DAILY 01/18/21   Breeback, Jade L, PA-C  traZODone (DESYREL) 150 MG tablet TAKE 1 TABLET BY MOUTH AT BEDTIME 05/23/20   Breeback, Jade L, PA-C  vortioxetine HBr (TRINTELLIX) 20 MG TABS tablet TAKE 1 TABLET BY MOUTH ONCE DAILY . 09/21/20   Donella Stade, PA-C    Family History Family History  Problem Relation Age of Onset   Cancer Father        metastatic, colon ca   Hypertension Mother    Diabetes Mother    Breast cancer Mother    Cancer Mother 69       breast cancer   Breast cancer Maternal Aunt    Breast cancer Paternal Aunt    Breast cancer Maternal Grandmother    Breast cancer Paternal Grandmother     Social History Social History   Tobacco Use   Smoking status: Never   Smokeless tobacco: Never  Substance Use Topics   Alcohol use: No   Drug use: No     Allergies   Ambien [zolpidem], Gabapentin, Morphine and related, and Prednisone   Review of  Systems Review of Systems  Constitutional: Negative.   HENT: Negative.    Eyes: Negative.   Cardiovascular: Negative.   Gastrointestinal: Negative.   Genitourinary: Negative.   Musculoskeletal:  Positive for neck pain.  Skin: Negative.   Neurological:  Positive for headaches.    Physical Exam Triage Vital Signs ED Triage Vitals  Enc Vitals Group     BP 02/24/21 1226 116/78     Pulse Rate 02/24/21 1226 79     Resp 02/24/21 1226 18     Temp 02/24/21 1226 97.7 F (36.5 C)     Temp Source 02/24/21 1226 Oral  SpO2 02/24/21 1226 100 %     Weight --      Height --      Head Circumference --      Peak Flow --      Pain Score 02/24/21 1228 7     Pain Loc --      Pain Edu? --      Excl. in [place]? --    No data found.  Updated Vital Signs BP 116/78 (BP Location: Right Arm)   Pulse 79   Temp 97.7 F (36.5 C) (Oral)   Resp 18   SpO2 100%       Physical Exam Vitals and nursing note reviewed.  Constitutional:      General: She is not in acute distress.    Appearance: She is well-developed. She is obese. She is not ill-appearing.  HENT:     Head: Normocephalic and atraumatic.  Neck:     Comments: Limited range of motion with 4 planes of movement Cardiovascular:     Rate and Rhythm: Normal rate and regular rhythm.     Pulses: Normal pulses.     Heart sounds: Normal heart sounds.  Pulmonary:     Effort: Pulmonary effort is normal. No respiratory distress.     Breath sounds: Normal breath sounds. No wheezing, rhonchi or rales.  Musculoskeletal:     Comments: TTP over right inferior trapezius/superior rhomboid muscle groups, no deformity noted  Skin:    General: Skin is warm and dry.  Neurological:     General: No focal deficit present.     Mental Status: She is alert and oriented to person, place, and time.  Psychiatric:        Mood and Affect: Mood normal.        Behavior: Behavior normal.     UC Treatments / Results  Labs (all labs ordered are listed, but  only abnormal results are displayed) Labs Reviewed - No data to display  EKG   Radiology DG Cervical Spine Complete  Result Date: 02/24/2021 CLINICAL DATA:  Neck pain and right shoulder pain.  No injury. EXAM: CERVICAL SPINE - COMPLETE 4+ VIEW COMPARISON:  None. FINDINGS: There is no evidence of cervical spine fracture or prevertebral soft tissue swelling. Alignment is normal. No other significant bone abnormalities are identified. IMPRESSION: Negative cervical spine radiographs. Electronically Signed   By: Lajean Manes M.D.   On: 02/24/2021 13:46    Procedures Procedures (including critical care time)  Medications Ordered in UC Medications  ketorolac (TORADOL) injection 60 mg (60 mg Intramuscular Given 02/24/21 1421)    Initial Impression / Assessment and Plan / UC Course  I have reviewed the triage vital signs and the nursing notes.  Pertinent labs & imaging results that were available during my care of the patient were reviewed by me and considered in my medical decision making (see chart for details).     MDM:.  Cervicogenic headache, 2.  Trapezius muscle spasm, 3.  Cervicalgia, Patient discharged home, hemodynamically stable. Final Clinical Impressions(s) / UC Diagnoses   Final diagnoses:  Cervicogenic headache  Trapezius muscle spasm  Cervicalgia     Discharge Instructions      Advised patient to take medication as directed.  Advised patient may use baclofen daily or as needed.  Advised patient to use either rizatriptan or sumatriptan for migraine.  Advised patient not to use both medications at the same time.  Encourage patient to increase daily water intake while taking these medications.  ED Prescriptions     Medication Sig Dispense Auth. Provider   baclofen (LIORESAL) 10 MG tablet Take 1 tablet (10 mg total) by mouth 3 (three) times daily. 30 each Eliezer Lofts, FNP   rizatriptan (MAXALT) 10 MG tablet Take 1 tablet (10 mg total) by mouth as needed for  migraine. May repeat in 2 hours if needed 10 tablet Eliezer Lofts, FNP      PDMP not reviewed this encounter.   Eliezer Lofts, [place] 02/24/21 1425

## 2021-02-24 NOTE — Telephone Encounter (Signed)
Patient left vm, it was very hard to unstand as the phone kept cutting in and out. It was about having a Migraine. It sounded like for 4 days. This is all I could understand. Was going to call back, but looks like patient currently being treated at urgent care for headache.

## 2021-02-24 NOTE — Discharge Instructions (Addendum)
Advised patient to take medication as directed.  Advised patient may use baclofen daily or as needed.  Advised patient to use either rizatriptan or sumatriptan for migraine.  Advised patient not to use both medications at the same time.  Encourage patient to increase daily water intake while taking these medications.

## 2021-03-31 ENCOUNTER — Other Ambulatory Visit: Payer: Self-pay

## 2021-03-31 ENCOUNTER — Ambulatory Visit: Payer: 59 | Admitting: Physician Assistant

## 2021-03-31 ENCOUNTER — Encounter: Payer: Self-pay | Admitting: Physician Assistant

## 2021-03-31 VITALS — BP 100/61 | HR 76 | Temp 98.0°F | Ht 66.0 in | Wt 214.0 lb

## 2021-03-31 DIAGNOSIS — F419 Anxiety disorder, unspecified: Secondary | ICD-10-CM

## 2021-03-31 DIAGNOSIS — F5101 Primary insomnia: Secondary | ICD-10-CM

## 2021-03-31 DIAGNOSIS — F32A Depression, unspecified: Secondary | ICD-10-CM

## 2021-03-31 DIAGNOSIS — G43009 Migraine without aura, not intractable, without status migrainosus: Secondary | ICD-10-CM

## 2021-03-31 DIAGNOSIS — F331 Major depressive disorder, recurrent, moderate: Secondary | ICD-10-CM | POA: Diagnosis not present

## 2021-03-31 DIAGNOSIS — I10 Essential (primary) hypertension: Secondary | ICD-10-CM

## 2021-03-31 MED ORDER — ALPRAZOLAM 0.5 MG PO TABS: 0.5000 mg | ORAL_TABLET | Freq: Two times a day (BID) | ORAL | 1 refills | Status: DC | PRN

## 2021-03-31 MED ORDER — QULIPTA 60 MG PO TABS: 1.0000 | ORAL_TABLET | Freq: Every day | ORAL | 11 refills | Status: DC

## 2021-03-31 MED ORDER — MIRTAZAPINE 15 MG PO TABS: ORAL_TABLET | ORAL | 3 refills | Status: DC

## 2021-03-31 MED ORDER — LISINOPRIL-HYDROCHLOROTHIAZIDE 20-12.5 MG PO TABS: 1.0000 | ORAL_TABLET | Freq: Every day | ORAL | 3 refills | Status: DC

## 2021-03-31 NOTE — Progress Notes (Signed)
Subjective:    Patient ID: Denise Craig, female    DOB: [birth year], 50 y.o.   MRN: [id number]  HPI Pt is a 50 yo obese female with HtN, Migraines, GERD, Asthma, insomnia, MDD who presents to the clinic for medication follow up.   Her migraines are not controlled. She continues to get migraines at end of month on ajovy as well but not as bad as emgality. Still using imitrex and excedrin migraine for rescue. Cervical xray done with do major concerns.   Mood and sleep are controlled.   She has no problems with breathing, coughing, wheezing.   .. 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   Anxiety and depression 06/01/2016   Hot flashes 09/21/2016   Family history of breast cancer 09/21/2016   At risk for impaired digestion 09/21/2016   Menopause 09/24/2016   Vitamin D deficiency 09/24/2016   Low iron stores 09/24/2016   Fidgeting 01/21/2018   Chest tightness 01/21/2018   No energy 01/21/2018   Poor concentration 01/21/2018   Moderate episode of recurrent major depressive disorder ([place]) 09/21/2020   Resolved Ambulatory Problems    Diagnosis Date Noted   Leg edema 01/02/2011   Past Medical History:  Diagnosis Date   Anxiety    Asthma    B12 deficiency    Depression    Infertility, female    Iron deficiency    Migraine    PONV (postoperative nausea and vomiting)    Status post gastrectomy 05/12/12   Uterine polyp       Review  of Systems    See HPI.  Objective:   Physical Exam Vitals reviewed.  Constitutional:      Appearance: Normal appearance. She is obese.  HENT:     Head: Normocephalic.  Cardiovascular:     Rate and Rhythm: Normal rate and regular rhythm.     Pulses: Normal pulses.     Heart sounds: Normal heart sounds.  Pulmonary:     Effort: Pulmonary effort is normal.     Breath sounds: Normal breath sounds.  Musculoskeletal:     Right lower leg: No edema.     Left lower leg: No edema.  Neurological:     General: No focal deficit present.     Mental Status: She is alert and oriented to person, place, and time.  Psychiatric:        Mood and Affect: Mood normal.      .. Depression screen [facility] 2/9 03/31/2021 09/21/2020 09/02/2019 01/14/2019 08/05/2018  Decreased Interest '1 1 1 '$ 0 2  Down, Depressed, Hopeless 1 1 0 1 1  PHQ - 2 Score '2 2 1 1 3  '$ Altered sleeping '3 3 2 2 1  '$ Tired, decreased energy '1 2 2 2 2  '$ Change in appetite 1 1 0 0 1  Feeling bad or failure about yourself  0 0 1 0 1  Trouble concentrating 1 1 0 1 1  Moving slowly or fidgety/restless 0 0 1 1  1  Suicidal thoughts 0 0 0 0 0  PHQ-9 Score '8 9 7 7 10  '$ Difficult doing work/chores Not difficult at all - Somewhat difficult Somewhat difficult Somewhat difficult  Some recent data might be hidden   .Marland Kitchen GAD 7 : Generalized Anxiety Score 03/31/2021 09/21/2020 09/02/2019 01/14/2019  Nervous, Anxious, on Edge '1 1 1 1  '$ Control/stop worrying '1 1 2 2  '$ Worry too much - different things '1 1 2 2  '$ Trouble relaxing '1 1 1 2  '$ Restless 0 0 1 1  Easily annoyed or irritable '1 1 2 3  '$ Afraid - awful might happen 0 0 0 0  Total GAD 7 Score '5 5 9 11  '$ Anxiety Difficulty Not difficult at all Somewhat difficult Somewhat difficult Somewhat difficult        Assessment & Plan:  Marland KitchenMarland KitchenRus was seen today for anxiety, migraine and hypertension.  Diagnoses and all orders for this visit:  Moderate episode of recurrent major depressive disorder (HCC) -      mirtazapine (REMERON) 15 MG tablet; TAKE 1 TO 2 TABLETS BY MOUTH ABOUT 1 HOUR BEFORE BEDTIME  Migraine without aura and without status migrainosus, not intractable -     Atogepant (QULIPTA) 60 MG TABS; Take 1 tablet by mouth daily. For migraine prevention.  Anxiety and depression -     ALPRAZolam (XANAX) 0.5 MG tablet; Take 1 tablet (0.5 mg total) by mouth 2 (two) times daily as needed for anxiety.  Primary insomnia -     mirtazapine (REMERON) 15 MG tablet; TAKE 1 TO 2 TABLETS BY MOUTH ABOUT 1 HOUR BEFORE BEDTIME  Essential hypertension -     lisinopril-hydrochlorothiazide (ZESTORETIC) 20-12.5 MG tablet; Take 1 tablet by mouth daily.  Migraine-  Stop ajovy.  Start qulipta.  Follow up in 3 months.   BP looks great. Continue. Lisinopril/HCTZ.   Xanax not filled since last august 30 tablets. Sent #30 to pharmacy. Pt requested.   Spent 30 minutes with patient discussing migraines and prevention and medication changes.

## 2021-04-05 ENCOUNTER — Encounter: Payer: Self-pay | Admitting: Physician Assistant

## 2021-04-10 MED ORDER — RIZATRIPTAN BENZOATE 10 MG PO TABS: 10.0000 mg | ORAL_TABLET | ORAL | 0 refills | Status: DC | PRN

## 2021-04-10 NOTE — Addendum Note (Signed)
Addended by: Donella Stade on: 04/10/2021 04:52 PM   Modules accepted: Orders

## 2021-05-02 ENCOUNTER — Other Ambulatory Visit: Payer: Self-pay | Admitting: Physician Assistant

## 2021-06-09 ENCOUNTER — Telehealth (INDEPENDENT_AMBULATORY_CARE_PROVIDER_SITE_OTHER): Payer: 59 | Admitting: Physician Assistant

## 2021-06-09 ENCOUNTER — Encounter: Payer: Self-pay | Admitting: Physician Assistant

## 2021-06-09 DIAGNOSIS — F419 Anxiety disorder, unspecified: Secondary | ICD-10-CM | POA: Diagnosis not present

## 2021-06-09 DIAGNOSIS — F4321 Adjustment disorder with depressed mood: Secondary | ICD-10-CM

## 2021-06-09 DIAGNOSIS — F5101 Primary insomnia: Secondary | ICD-10-CM

## 2021-06-09 DIAGNOSIS — F331 Major depressive disorder, recurrent, moderate: Secondary | ICD-10-CM | POA: Diagnosis not present

## 2021-06-09 MED ORDER — MIRTAZAPINE 45 MG PO TABS: 45.0000 mg | ORAL_TABLET | Freq: Every day | ORAL | 0 refills | Status: DC

## 2021-06-09 MED ORDER — ALPRAZOLAM 1 MG PO TABS: 1.0000 mg | ORAL_TABLET | Freq: Two times a day (BID) | ORAL | 0 refills | Status: DC | PRN

## 2021-06-09 NOTE — Telephone Encounter (Signed)
Got patient scheduled virtually for today at 2:40pm with Jade. She stated she was at the [facility] home & having to make arrangements but she will do her best to do the visit today at 2:40pm, I told her if that didn't work later to call us back and let us know. AM

## 2021-06-09 NOTE — Progress Notes (Signed)
..Virtual Visit via Video Note  I connected with [name] on 06/09/21 at  2:40 PM EDT by a video enabled telemedicine application and verified that I am speaking with the correct person using two identifiers.  Location: Patient: Denise Craig Provider: clinic  .Marland KitchenParticipating in visit:  Patient: Denise Craig Provider: Iran Planas PA-C   I discussed the limitations of evaluation and management by telemedicine and the availability of in person appointments. The patient expressed understanding and agreed to proceed.  History of Present Illness: Pt is a 50 yo female with MDD, insomnia and anxiety who presents to the clinic to discuss worsening anxiety after her mother died yesterday. She was not able to sleep last night. She is very anxious and tearful. She just wants to cry all the time. No SI/HC. Xanax .'5mg'$  is not helping at all to calm her down.   .. 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   Anxiety and depression 06/01/2016   Hot flashes 09/21/2016   Family history of breast cancer 09/21/2016   At risk for impaired digestion 09/21/2016   Menopause 09/24/2016   Vitamin D deficiency 09/24/2016   Low iron stores 09/24/2016   Fidgeting 01/21/2018   Chest tightness 01/21/2018   No energy 01/21/2018   Poor concentration 01/21/2018   Moderate episode of recurrent major depressive disorder ([place]) 09/21/2020   Resolved Ambulatory Problems    Diagnosis  Date Noted   Leg edema 01/02/2011   Past Medical History:  Diagnosis Date   Anxiety    Asthma    B12 deficiency    Depression    Infertility, female    Iron deficiency    Migraine    PONV (postoperative nausea and vomiting)    Status post gastrectomy 05/12/12   Uterine polyp        Observations/Objective: Pt is tearful and emotional.   ..There were no vitals filed for this visit. There is no height or weight on file to calculate BMI.    Assessment and Plan: Marland KitchenMarland KitchenDelee was seen today for anxiety.  Diagnoses and all orders for this visit:  Grief -     ALPRAZolam (XANAX) 1 MG tablet; Take 1 tablet (1 mg total) by mouth 2 (two) times daily as needed for anxiety.  Primary insomnia -     mirtazapine (REMERON) 45 MG tablet; Take 1 tablet (45 mg total) by mouth at bedtime.  Moderate episode of recurrent major depressive disorder (HCC)  Anxiety -     ALPRAZolam (XANAX) 1 MG tablet; Take 1 tablet (1 mg total) by mouth 2 (two) times daily as needed for anxiety.   Mother died yesterday. Discussed grief and how no pill can take all the pain away.  Increased xanax short term.  Increased remeron to '45mg'$  at bedtime.  Follow up in 1 month.  Consider grief counseling.    Follow Up Instructions:    I discussed the assessment and treatment plan with the patient. The patient was provided an opportunity to ask questions and  all were answered. The patient agreed with the plan and demonstrated an understanding of the instructions.   The patient was advised to call back or seek an in-person evaluation if the symptoms worsen or if the condition fails to improve as anticipated.   Iran Planas, PA-C

## 2021-06-15 ENCOUNTER — Other Ambulatory Visit: Payer: Self-pay | Admitting: Neurology

## 2021-06-15 MED ORDER — BUSPIRONE HCL 15 MG PO TABS: 15.0000 mg | ORAL_TABLET | Freq: Two times a day (BID) | ORAL | 0 refills | Status: DC

## 2021-10-04 ENCOUNTER — Other Ambulatory Visit: Payer: Self-pay | Admitting: Physician Assistant

## 2021-10-04 ENCOUNTER — Encounter: Payer: Self-pay | Admitting: Physician Assistant

## 2021-10-04 DIAGNOSIS — F419 Anxiety disorder, unspecified: Secondary | ICD-10-CM

## 2021-10-04 MED ORDER — VORTIOXETINE HBR 20 MG PO TABS: 20.0000 mg | ORAL_TABLET | Freq: Every day | ORAL | 0 refills | Status: DC

## 2021-10-13 ENCOUNTER — Other Ambulatory Visit: Payer: Self-pay

## 2021-10-13 ENCOUNTER — Ambulatory Visit: Payer: 59 | Admitting: Physician Assistant

## 2021-10-13 ENCOUNTER — Encounter: Payer: Self-pay | Admitting: Physician Assistant

## 2021-10-13 VITALS — BP 125/85 | HR 70 | Ht 66.0 in | Wt 222.0 lb

## 2021-10-13 DIAGNOSIS — R110 Nausea: Secondary | ICD-10-CM | POA: Insufficient documentation

## 2021-10-13 DIAGNOSIS — Z13220 Encounter for screening for lipoid disorders: Secondary | ICD-10-CM | POA: Diagnosis not present

## 2021-10-13 DIAGNOSIS — F32A Depression, unspecified: Secondary | ICD-10-CM

## 2021-10-13 DIAGNOSIS — E559 Vitamin D deficiency, unspecified: Secondary | ICD-10-CM

## 2021-10-13 DIAGNOSIS — Z9884 Bariatric surgery status: Secondary | ICD-10-CM

## 2021-10-13 DIAGNOSIS — K219 Gastro-esophageal reflux disease without esophagitis: Secondary | ICD-10-CM

## 2021-10-13 DIAGNOSIS — D518 Other vitamin B12 deficiency anemias: Secondary | ICD-10-CM | POA: Diagnosis not present

## 2021-10-13 DIAGNOSIS — F419 Anxiety disorder, unspecified: Secondary | ICD-10-CM

## 2021-10-13 DIAGNOSIS — I10 Essential (primary) hypertension: Secondary | ICD-10-CM | POA: Diagnosis not present

## 2021-10-13 DIAGNOSIS — Z1329 Encounter for screening for other suspected endocrine disorder: Secondary | ICD-10-CM

## 2021-10-13 DIAGNOSIS — G43009 Migraine without aura, not intractable, without status migrainosus: Secondary | ICD-10-CM

## 2021-10-13 DIAGNOSIS — R7303 Prediabetes: Secondary | ICD-10-CM | POA: Insufficient documentation

## 2021-10-13 DIAGNOSIS — Z6835 Body mass index (BMI) 35.0-35.9, adult: Secondary | ICD-10-CM

## 2021-10-13 MED ORDER — RIZATRIPTAN BENZOATE 10 MG PO TABS: 10.0000 mg | ORAL_TABLET | ORAL | 5 refills | Status: DC | PRN

## 2021-10-13 MED ORDER — VORTIOXETINE HBR 20 MG PO TABS: 20.0000 mg | ORAL_TABLET | Freq: Every day | ORAL | 3 refills | Status: DC

## 2021-10-13 MED ORDER — ONDANSETRON HCL 8 MG PO TABS: 8.0000 mg | ORAL_TABLET | Freq: Three times a day (TID) | ORAL | 1 refills | Status: DC | PRN

## 2021-10-13 MED ORDER — BUSPIRONE HCL 15 MG PO TABS: 15.0000 mg | ORAL_TABLET | Freq: Two times a day (BID) | ORAL | 3 refills | Status: DC

## 2021-10-13 MED ORDER — OZEMPIC (0.25 OR 0.5 MG/DOSE) 2 MG/1.5ML ~~LOC~~ SOPN: 0.5000 mg | PEN_INJECTOR | SUBCUTANEOUS | 2 refills | Status: DC

## 2021-10-13 MED ORDER — PANTOPRAZOLE SODIUM 40 MG PO TBEC: 40.0000 mg | DELAYED_RELEASE_TABLET | Freq: Two times a day (BID) | ORAL | 3 refills | Status: DC

## 2021-10-13 NOTE — Progress Notes (Signed)
Subjective:    Patient ID: Denise Craig, female    DOB: 10/16/1970, 51 y.o.   MRN: [id number]  HPI Pt is a 51 yo obese female with HTN, migraines, GERD, IBS, MDD, GAD, ASthma, hx of sleeve gastrectomy who presents to the clinic for medication refills.   Pt is doing well. No CP, palpitations. She is frustrated with her weight. She would like to try ozempic. Her breathing is controlled. Her mood is good. Her sleep is ok. She is adjusting to a new shift change. Migraines are controlled with occasional break through that maxalt rescues.   She has been intermittently nauseated. No known trigger. No bowel changes. UTD colonoscopy. No worsening of GERD.   .. Active Ambulatory Problems    Diagnosis Date Noted   THYROMEGALY 12/11/2007   Class 2 severe obesity due to excess calories with serious comorbidity and body mass index (BMI) of 35.0 to 35.9 in adult ([place]) 06/11/2006   Iron deficiency anemia 05/16/2007   ANEMIA, VITAMIN B12 DEFICIENCY 06/04/2007   Anxiety state 06/11/2006   Migraine headache 06/11/2006   ASTHMA, PERSISTENT, MODERATE 05/16/2007   HIATAL HERNIA 12/22/2007   IBS 06/16/2009   ENDOMETRIOSIS 06/11/2006   INSOMNIA 07/15/2008   Essential hypertension 02/27/2011   Gastroesophageal reflux disease 09/17/2012   Nephrolithiasis 27/74/1287   Periumbilical abdominal pain 01/25/2016   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   Anxiety and depression 06/01/2016   Hot flashes 09/21/2016   Family history of breast cancer 09/21/2016   At risk for impaired digestion 09/21/2016   Menopause 09/24/2016   Vitamin D deficiency 09/24/2016   Low iron stores 09/24/2016   Fidgeting 01/21/2018   Chest tightness 01/21/2018   No energy 01/21/2018   Poor concentration 01/21/2018   Moderate episode of recurrent major depressive disorder ([place]) 09/21/2020   Nausea 10/13/2021   Pre-diabetes 10/13/2021   Resolved Ambulatory Problems    Diagnosis Date Noted   Leg edema 01/02/2011    Past Medical History:  Diagnosis Date   Anxiety    Asthma    B12 deficiency    Depression    GERD (gastroesophageal reflux disease)    Infertility, female    Iron deficiency    Migraine    PONV (postoperative nausea and vomiting)    Status post gastrectomy 05/12/12   Uterine polyp        Review of Systems  All other systems reviewed and are negative.     Objective:   Physical Exam Vitals reviewed.  Constitutional:      Appearance: Normal appearance. She is obese.  HENT:     Head: Normocephalic.     Right Ear: Tympanic membrane normal.     Left Ear: Tympanic membrane normal.     Nose: Nose normal.     Mouth/Throat:     Mouth: Mucous membranes are moist.  Eyes:     Conjunctiva/sclera: Conjunctivae normal.  Neck:     Vascular: No carotid bruit.  Cardiovascular:     Rate and Rhythm: Normal rate and regular rhythm.     Pulses: Normal pulses.     Heart sounds: Murmur heard.  Pulmonary:     Effort: Pulmonary effort is normal.  Musculoskeletal:     Right lower leg: No edema.     Left lower leg: No edema.  Lymphadenopathy:     Cervical: No cervical adenopathy.  Neurological:     General: No focal deficit present.     Mental Status: She is alert and oriented to  person, place, and time.  Psychiatric:        Mood and Affect: Mood normal.          Assessment & Plan:  Marland KitchenMarland KitchenAdriannah was seen today for follow-up.  Diagnoses and all orders for this visit:  Essential hypertension -     COMPLETE METABOLIC PANEL WITH GFR  Screening for lipid disorders -     Lipid Panel w/reflex Direct LDL  Thyroid disorder screen -     TSH  ANEMIA, VITAMIN B12 DEFICIENCY -     CBC with Differential/Platelet -     Vitamin B12  Vitamin D deficiency -     Vitamin D (25 hydroxy)  Anxiety and depression -     busPIRone (BUSPAR) 15 MG tablet; Take 1 tablet (15 mg total) by mouth 2 (two) times daily. -     vortioxetine HBr (TRINTELLIX) 20 MG TABS tablet; Take 1 tablet (20 mg  total) by mouth daily.  Gastroesophageal reflux disease, unspecified whether esophagitis present -     pantoprazole (PROTONIX) 40 MG tablet; Take 1 tablet (40 mg total) by mouth 2 (two) times daily.  Nausea -     ondansetron (ZOFRAN) 8 MG tablet; Take 1 tablet (8 mg total) by mouth every 8 (eight) hours as needed for nausea or vomiting.  S/P laparoscopic sleeve gastrectomy -     Semaglutide,0.25 or 0.5MG /DOS, (OZEMPIC, 0.25 OR 0.5 MG/DOSE,) 2 MG/1.5ML SOPN; Inject 0.5 mg into the skin once a week.  Pre-diabetes -     Semaglutide,0.25 or 0.5MG /DOS, (OZEMPIC, 0.25 OR 0.5 MG/DOSE,) 2 MG/1.5ML SOPN; Inject 0.5 mg into the skin once a week.  Class 2 severe obesity due to excess calories with serious comorbidity and body mass index (BMI) of 35.0 to 35.9 in adult [facility])  Migraine without aura and without status migrainosus, not intractable -     rizatriptan (MAXALT) 10 MG tablet; Take 1 tablet (10 mg total) by mouth as needed for migraine. May repeat in 2 hours if needed    Fasting labs ordered.  Medications refilled.  Marland Kitchen.Discussed low carb diet with 1500 calories and 80g of protein.  Exercising at least 150 minutes a week.  [employer] could be a Microbiologist.  Discussed options.  Pt is pre-diabetic with weight gain. Start ozempic. Titrate up to .5mg  weekly.  Discussed side effects.  Follow up nausea if worsens.  Follow up in 3 months.

## 2021-10-14 LAB — CBC WITH DIFFERENTIAL/PLATELET
Absolute Monocytes: 332 cells/uL (ref 200–950)
Basophils Absolute: 62 cells/uL (ref 0–200)
Basophils Relative: 1.5 %
Eosinophils Absolute: 209 cells/uL (ref 15–500)
Eosinophils Relative: 5.1 %
HCT: 34.4 % — ABNORMAL LOW (ref 35.0–45.0)
Hemoglobin: 11 g/dL — ABNORMAL LOW (ref 11.7–15.5)
Lymphs Abs: 1066 cells/uL (ref 850–3900)
MCH: 27.7 pg (ref 27.0–33.0)
MCHC: 32 g/dL (ref 32.0–36.0)
MCV: 86.6 fL (ref 80.0–100.0)
MPV: 11.3 fL (ref 7.5–12.5)
Monocytes Relative: 8.1 %
Neutro Abs: 2431 cells/uL (ref 1500–7800)
Neutrophils Relative %: 59.3 %
Platelets: 208 10*3/uL (ref 140–400)
RBC: 3.97 10*6/uL (ref 3.80–5.10)
RDW: 13.3 % (ref 11.0–15.0)
Total Lymphocyte: 26 %
WBC: 4.1 10*3/uL (ref 3.8–10.8)

## 2021-10-14 LAB — VITAMIN B12: Vitamin B-12: 453 pg/mL (ref 200–1100)

## 2021-10-14 LAB — COMPLETE METABOLIC PANEL WITH GFR
AG Ratio: 1.8 (calc) (ref 1.0–2.5)
ALT: 16 U/L (ref 6–29)
AST: 15 U/L (ref 10–35)
Albumin: 4 g/dL (ref 3.6–5.1)
Alkaline phosphatase (APISO): 72 U/L (ref 37–153)
BUN/Creatinine Ratio: 13 (calc) (ref 6–22)
BUN: 22 mg/dL (ref 7–25)
CO2: 26 mmol/L (ref 20–32)
Calcium: 9.1 mg/dL (ref 8.6–10.4)
Chloride: 109 mmol/L (ref 98–110)
Creat: 1.68 mg/dL — ABNORMAL HIGH (ref 0.50–1.03)
Globulin: 2.2 g/dL (calc) (ref 1.9–3.7)
Glucose, Bld: 89 mg/dL (ref 65–99)
Potassium: 4.9 mmol/L (ref 3.5–5.3)
Sodium: 143 mmol/L (ref 135–146)
Total Bilirubin: 0.3 mg/dL (ref 0.2–1.2)
Total Protein: 6.2 g/dL (ref 6.1–8.1)
eGFR: 37 mL/min/{1.73_m2} — ABNORMAL LOW (ref >=60)

## 2021-10-14 LAB — LIPID PANEL W/REFLEX DIRECT LDL
Cholesterol: 225 mg/dL — ABNORMAL HIGH (ref <200)
HDL: 73 mg/dL (ref >=50)
LDL Cholesterol (Calc): 135 mg/dL (calc) — ABNORMAL HIGH
Non-HDL Cholesterol (Calc): 152 mg/dL (calc) — ABNORMAL HIGH (ref <130)
Total CHOL/HDL Ratio: 3.1 (calc) (ref <5.0)
Triglycerides: 77 mg/dL (ref <150)

## 2021-10-14 LAB — TSH: TSH: 3.12 mIU/L

## 2021-10-14 LAB — VITAMIN D 25 HYDROXY (VIT D DEFICIENCY, FRACTURES): Vit D, 25-Hydroxy: 17 ng/mL — ABNORMAL LOW (ref 30–100)

## 2021-10-16 ENCOUNTER — Other Ambulatory Visit: Payer: Self-pay | Admitting: Neurology

## 2021-10-16 DIAGNOSIS — N289 Disorder of kidney and ureter, unspecified: Secondary | ICD-10-CM

## 2021-10-16 NOTE — Progress Notes (Signed)
B12 is great. Stay on same dose.  Thyroid perfect.  Vitamin D is low. How much vitamin D are you taking? Are you taking with dairy or fatty food?  Hemoglobin is low. Are you taking any iron?  I think vitamin D and hemoglobin being low are a lot of absorption issues from past bariatric surgeries.  HDL looks great.  LdL, bad cholesterol up from 1 year ago. Need to watch processed and fatty foods.   Kidney function has dropped quite a bit in a year. Avoid any NSAIDs and recheck in 4 weeks to see if we can get it to come back up.

## 2021-10-17 ENCOUNTER — Encounter: Payer: Self-pay | Admitting: Physician Assistant

## 2021-10-18 MED ORDER — VITAMIN D (ERGOCALCIFEROL) 1.25 MG (50000 UNIT) PO CAPS: 50000.0000 [IU] | ORAL_CAPSULE | ORAL | 3 refills | Status: DC

## 2021-10-23 ENCOUNTER — Telehealth: Payer: Self-pay

## 2021-10-23 NOTE — Telephone Encounter (Addendum)
Initiated Prior authorization KZL:[id number], 0.25 OR 0.5 MG Via: Covermymeds Case/Key:[id number] Status: approved  as of 10/23/21 Reason:approved through 10/23/2022.  Notified Pt via: Mychart

## 2021-11-16 ENCOUNTER — Telehealth: Payer: Self-pay

## 2021-11-16 NOTE — Telephone Encounter (Addendum)
Initiated Prior authorization for: Lenoria Chime 60MG  tablets ?Via: Covermymeds ?Case/Key:[id number] ?Status: denied as of 11/16/21 ?Reason:plan exclusion,  ?This request was denied because you did not meet the following clinical requirements: ?The requested medication and/or diagnosis are not a covered benefit and excluded from coverage in ?accordance with the terms and conditions of your plan benefit. Therefore, the request has been ?administratively denied ?Notified Pt via: Mychart ?

## 2021-12-19 LAB — HM MAMMOGRAPHY

## 2021-12-20 LAB — HM PAP SMEAR: HM Pap smear: NEGATIVE

## 2021-12-21 ENCOUNTER — Other Ambulatory Visit: Payer: Self-pay | Admitting: Physician Assistant

## 2022-01-10 ENCOUNTER — Ambulatory Visit (INDEPENDENT_AMBULATORY_CARE_PROVIDER_SITE_OTHER): Payer: 59 | Admitting: Physician Assistant

## 2022-01-10 ENCOUNTER — Telehealth: Payer: Self-pay

## 2022-01-10 ENCOUNTER — Encounter: Payer: Self-pay | Admitting: Physician Assistant

## 2022-01-10 VITALS — BP 91/54 | HR 70 | Resp 16 | Ht 66.0 in | Wt 214.0 lb

## 2022-01-10 DIAGNOSIS — F419 Anxiety disorder, unspecified: Secondary | ICD-10-CM | POA: Diagnosis not present

## 2022-01-10 DIAGNOSIS — R7303 Prediabetes: Secondary | ICD-10-CM | POA: Diagnosis not present

## 2022-01-10 DIAGNOSIS — R519 Headache, unspecified: Secondary | ICD-10-CM

## 2022-01-10 DIAGNOSIS — F5101 Primary insomnia: Secondary | ICD-10-CM

## 2022-01-10 DIAGNOSIS — F32A Depression, unspecified: Secondary | ICD-10-CM

## 2022-01-10 DIAGNOSIS — G43009 Migraine without aura, not intractable, without status migrainosus: Secondary | ICD-10-CM

## 2022-01-10 DIAGNOSIS — G43011 Migraine without aura, intractable, with status migrainosus: Secondary | ICD-10-CM

## 2022-01-10 DIAGNOSIS — E6609 Other obesity due to excess calories: Secondary | ICD-10-CM

## 2022-01-10 DIAGNOSIS — Z6834 Body mass index (BMI) 34.0-34.9, adult: Secondary | ICD-10-CM

## 2022-01-10 LAB — POCT GLYCOSYLATED HEMOGLOBIN (HGB A1C): Hemoglobin A1C: 5.3 % (ref 4.0–5.6)

## 2022-01-10 MED ORDER — VORTIOXETINE HBR 20 MG PO TABS: 20.0000 mg | ORAL_TABLET | Freq: Every day | ORAL | 5 refills | Status: DC

## 2022-01-10 MED ORDER — DEXAMETHASONE SODIUM PHOSPHATE 10 MG/ML IJ SOLN
8.0000 mg | Freq: Once | INTRAMUSCULAR | Status: AC
  Administered 2022-01-10: 8 mg via INTRAMUSCULAR

## 2022-01-10 MED ORDER — DOXEPIN HCL 10 MG PO CAPS: 10.0000 mg | ORAL_CAPSULE | Freq: Every day | ORAL | 1 refills | Status: DC

## 2022-01-10 MED ORDER — NALTREXONE-BUPROPION HCL ER 8-90 MG PO TB12
2.0000 | ORAL_TABLET | Freq: Two times a day (BID) | ORAL | 2 refills | Status: DC
Start: 2022-01-10 — End: 2022-04-16

## 2022-01-10 MED ORDER — KETOROLAC TROMETHAMINE 30 MG/ML IJ SOLN
30.0000 mg | Freq: Once | INTRAMUSCULAR | Status: AC
  Administered 2022-01-10: 30 mg via INTRAMUSCULAR

## 2022-01-10 MED ORDER — NALTREXONE-BUPROPION HCL ER 8-90 MG PO TB12: ORAL_TABLET | ORAL | 0 refills | Status: DC

## 2022-01-10 NOTE — Progress Notes (Signed)
? ?Established Patient Office Visit ? ?Subjective   ?Patient ID: Denise Craig, female    DOB: 06-04-1971  Age: 51 y.o. MRN: [id number] ? ?Chief Complaint  ?Patient presents with  ? Follow-up  ?  A1c  ? Discuss Medication  ?  Patient states Remeron is not working.   ? Pap Smear/Mammogram  ?  Done at [facility]. Release form faxed.   ? ? ?HPI ?Pt is a 50 yo obese female with HTN, MDD, GAD, Migraines, insomnia, pre-diabetes who presents to the clinic for follow up and refills.  ? ?She was not able to tolerate the ozempic due to nausea and vomiting. She would like to continue to lose weight. She feels like if she could get the weight off she could keep her sugars under control. She is eating less and trying to walk some. She continues to work until 1:30 every morning and makes sleeping hard to do. She does not feel like remeron is helping at all anymore.  ? ?Denies any CP, palpitations, or vision changes.  ? ?Her migraines are much better controlled on Qulipta. She has a few a month but maxalt continues to rescue. She has sinus pressure today for the last few days. She is on singulair. No fever, chills, sinus drainge, ST.  ? ?Her mood is fairly well controlled. She loves her job and much less stressful. No SI/HC.  ? ?.. ?Active Ambulatory Problems  ?  Diagnosis Date Noted  ? THYROMEGALY 12/11/2007  ? Class 1 obesity due to excess calories without serious comorbidity with body mass index (BMI) of 34.0 to 34.9 in adult 06/11/2006  ? Iron deficiency anemia 05/16/2007  ? ANEMIA, VITAMIN B12 DEFICIENCY 06/04/2007  ? Anxiety state 06/11/2006  ? Migraine headache 06/11/2006  ? ASTHMA, PERSISTENT, MODERATE 05/16/2007  ? HIATAL HERNIA 12/22/2007  ? IBS 06/16/2009  ? ENDOMETRIOSIS 06/11/2006  ? INSOMNIA 07/15/2008  ? Essential hypertension 02/27/2011  ? Gastroesophageal reflux disease 09/17/2012  ? Nephrolithiasis 07/14/2013  ? Periumbilical abdominal pain 01/25/2016  ? 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  Anxiety and depression 06/01/2016  ? Hot flashes 09/21/2016  ? Family history of breast cancer 09/21/2016  ? At risk for impaired digestion 09/21/2016  ? Menopause 09/24/2016  ? Vitamin D deficiency 09/24/2016  ? Low iron stores 09/24/2016  ? Fidgeting 01/21/2018  ? Chest tightness 01/21/2018  ? No energy 01/21/2018  ? Poor concentration 01/21/2018  ? Moderate episode of recurrent major depressive disorder ([place]) 09/21/2020  ? Nausea 10/13/2021  ? Pre-diabetes 10/13/2021  ? Intractable migraine without aura and with status migrainosus 01/10/2022  ? Sinus headache 01/15/2022  ? ?Resolved Ambulatory Problems  ?  Diagnosis Date Noted  ? Leg edema 01/02/2011  ? ?Past Medical History:  ?Diagnosis Date  ? Anxiety   ? Asthma   ? B12 deficiency   ? Depression   ? GERD (gastroesophageal reflux disease)   ? Infertility, female   ? Iron deficiency   ? Migraine   ? PONV (postoperative nausea and vomiting)   ? Status post gastrectomy 05/12/12  ? Uterine polyp   ? ? ?ROS ? ? See HPI.  ?Objective:  ?  ? ?BP (!) 91/54   Pulse 70   Resp 16   Ht 5\' 6"  (1.676 m)   Wt 214 lb (97.1 kg)   SpO2 99%   BMI 34.54 kg/m?  ?BP Readings from Last 3 Encounters:  ?01/10/22 (!) 91/54  ?10/13/21 125/85  ?03/31/21 100/61  ? ?  Wt Readings from Last 3 Encounters:  ?01/10/22 214 lb (97.1 kg)  ?10/13/21 222 lb (100.7 kg)  ?03/31/21 214 lb (97.1 kg)  ? ?  ? ?Physical Exam ?Vitals reviewed.  ?Constitutional:   ?   Appearance: Normal appearance. She is obese.  ?HENT:  ?   Head: Normocephalic.  ?Cardiovascular:  ?   Rate and Rhythm: Normal rate.  ?   Pulses: Normal pulses.  ?   Heart sounds: Normal heart sounds.  ?Pulmonary:  ?   Effort: Pulmonary effort is normal.  ?   Breath sounds: Normal breath sounds.  ?Musculoskeletal:  ?   Right lower leg: No edema.  ?   Left lower leg: No edema.  ?Neurological:  ?   General: No focal deficit present.  ?   Mental Status: She is alert and oriented to person, place, and time.  ?Psychiatric:     ?   Mood and Affect:  Mood normal.  ? ? ? ? ? ?The 10-year ASCVD risk score (Arnett DK, et al., 2019) is: 0.7% ? ? .. ?Lab Results  ?Component Value Date  ? HGBA1C 5.3 01/10/2022  ? ?.. ? ?  10/13/2021  ? 11:40 AM 03/31/2021  ?  9:55 AM 09/21/2020  ?  8:22 AM 09/02/2019  ?  8:52 AM 01/14/2019  ?  9:08 AM  ?Depression screen PHQ 2/9  ?Decreased Interest 1 1 1 1  0  ?Down, Depressed, Hopeless 0 1 1 0 1  ?PHQ - 2 Score 1 2 2 1 1   ?Altered sleeping 1 3 3 2 2   ?Tired, decreased energy 1 1 2 2 2   ?Change in appetite 1 1 1  0 0  ?Feeling bad or failure about yourself  0 0 0 1 0  ?Trouble concentrating 0 1 1 0 1  ?Moving slowly or fidgety/restless 0 0 0 1 1  ?Suicidal thoughts 0 0 0 0 0  ?PHQ-9 Score 4 8 9 7 7   ?Difficult doing work/chores Not difficult at all Not difficult at all  Somewhat difficult Somewhat difficult  ? ?.. ? ?  10/13/2021  ? 11:41 AM 03/31/2021  ?  9:56 AM 09/21/2020  ?  8:23 AM 09/02/2019  ?  8:55 AM  ?GAD 7 : Generalized Anxiety Score  ?Nervous, Anxious, on Edge 2 1 1 1   ?Control/stop worrying 1 1 1 2   ?Worry too much - different things 1 1 1 2   ?Trouble relaxing 1 1 1 1   ?Restless 0 0 0 1  ?Easily annoyed or irritable 2 1 1 2   ?Afraid - awful might happen 0 0 0 0  ?Total GAD 7 Score 7 5 5 9   ?Anxiety Difficulty Somewhat difficult Not difficult at all Somewhat difficult Somewhat difficult  ? ? ? ?Assessment & Plan:  ?..Vianca was seen today for follow-up, discuss medication and pap smear/mammogram. ? ?Diagnoses and all orders for this visit: ? ?Pre-diabetes ?-     POCT glycosylated hemoglobin (Hb A1C) ? ?Anxiety and depression ?-     vortioxetine HBr (TRINTELLIX) 20 MG TABS tablet; Take 1 tablet (20 mg total) by mouth daily. ? ?Migraine without aura and without status migrainosus, not intractable ?-     dexamethasone (DECADRON) injection 8 mg ?-     ketorolac (TORADOL) 30 MG/ML injection 30 mg ? ?Primary insomnia ?-     doxepin (SINEQUAN) 10 MG capsule; Take 1 capsule (10 mg total) by mouth at bedtime. Start once tapered off  remeron. ? ?Intractable migraine without aura and  with status migrainosus ? ?Sinus headache ? ?Class 1 obesity due to excess calories without serious comorbidity with body mass index (BMI) of 34.0 to 34.9 in adult ?-     Naltrexone-buPROPion HCl ER 8-90 MG TB12; 1 tab daily for week 1, then 1 tab BID for week 2, then 2 tab PO qAM and 1 tab PO qPM for week 3, then 2 tabs BID. ?-     Naltrexone-buPROPion HCl ER 8-90 MG TB12; Take 2 tablets by mouth 2 (two) times daily. ? ? ?Toradol and Decadron given for headache today. Sounds more like allergies and sinus pressure ?Consider flonase daily with singulair and zyrtec ? ?A1C much better and out of pre-diabetes range ?Discussed goals of weight ?Start contrave ?Discussed side effects and titration up ?Encouraged 150 minutes of exercise a week ?Follow up in 3 months.  ? ?Taper off remeron-this could help with weight some and start doxepin for sleep ? ?Mood doing well ?Continue trintellix ? ?[facility]  ? ? ?Return in about 3 months (around 04/12/2022).  ? ? ?Iran Planas, PA-C ? ?

## 2022-01-10 NOTE — Telephone Encounter (Addendum)
Initiated Prior authorization ZNB:VAPOLIDC 8-90MG  er tablets ?Via: Covermymeds ?Case/Key:  [id number] ?Status: approved  as of 01/10/22 ?Reason:Contrave approved through 05/13/2022 ?Notified Pt via: Mychart  ?

## 2022-01-10 NOTE — Patient Instructions (Addendum)
Taper off remeron and try doxephin ? ?Naltrexone; Bupropion Extended-Release Tablets ?What is this medication? ?NALTREXONE; BUPROPION (nal TREX one; byoo PROE pee on) promotes weight loss. It may also be used to maintain weight loss. It works by decreasing appetite. Changes to diet and exercise are often combined with this medication. ?This medicine may be used for other purposes; ask your health care provider or pharmacist if you have questions. ?COMMON BRAND NAME(S): Contrave ?What should I tell my care team before I take this medication? ?They need to know if you have any of these conditions: ?An eating disorder, such as anorexia or bulimia ?Diabetes ?Depression ?Frequently drink alcohol ?Glaucoma ?Head injury ?Heart disease ?High blood pressure ?History of a tumor or infection of your brain or spine ?History of heart attack or stroke ?History of irregular heartbeat ?History of substance use disorder or alcohol use disorder ?Kidney disease ?Liver disease ?Low levels of sodium in the blood ?Mental health condition ?Seizures ?Suicidal thoughts, plans, or attempt by you or a family member ?Taken an MAOI like Carbex, Eldepryl, Marplan, Nardil, or Parnate in last 14 days ?An unusual or allergic reaction to bupropion, naltrexone, other medications, foods, dyes, or preservatives ?Breast-feeding ?Pregnant or trying to become pregnant ?How should I use this medication? ?Take this medication by mouth with a full glass of water. Take it as directed on the prescription label at the same time every day. Do not cut, crush, or chew this medication. Swallow the tablets whole. You can take it with or without food. Do not take with high-fat meals as this may increase your risk of seizures. Keep taking it unless your care team tells you to stop. ?A special MedGuide will be given to you by the pharmacist with each prescription and refill. Be sure to read this information carefully each time. ?Talk to your care team about the use of  this medication in children. Special care may be needed. ?Overdosage: If you think you have taken too much of this medicine contact a poison control center or emergency room at once. ?NOTE: This medicine is only for you. Do not share this medicine with others. ?What if I miss a dose? ?If you miss a dose, skip it. Take your next dose at the normal time. Do not take extra or 2 doses at the same time to make up for the missed dose. ?What may interact with this medication? ?Do not take this medication with any of the following: ?Any medications used to stop taking opioids, such as methadone or buprenorphine ?Linezolid ?MAOIs, such as Carbex, Eldepryl, Marplan, Nardil, and Parnate ?Methylene blue (injected into a vein) ?Opioid medications ?Other medications that contain bupropion, such as Zyban or Wellbutrin ?This medication may also interact with the following: ?Alcohol ?Certain medications for blood pressure, such as metoprolol, propranolol ?Certain medications for depression, anxiety, or mental health conditions ?Certain medications for HIV or hepatitis ?Certain medications for irregular heart beat, such as propafenone, flecainide ?Certain medications for Parkinson disease, such as amantadine, levodopa ?Certain medications for seizures, such as carbamazepine, phenytoin, phenobarbital ?Certain medications for sleep ?Cimetidine ?Clopidogrel ?Cyclophosphamide ?Digoxin ?Disulfiram ?Furazolidone ?Isoniazid ?Nicotine ?Orphenadrine ?Procarbazine ?Steroid medications, such as prednisone or cortisone ?Stimulant medications for attention disorders, weight loss, or to stay awake ?Tamoxifen ?Theophylline ?Thiotepa ?Ticlopidine ?Tramadol ?Warfarin ?This list may not describe all possible interactions. Give your health care provider a list of all the medicines, herbs, non-prescription drugs, or dietary supplements you use. Also tell them if you smoke, drink alcohol, or use illegal drugs. Some  items may interact with your  medicine. ?What should I watch for while using this medication? ?Visit your care team for regular checks on your progress. ?This medication may cause serious skin reactions. They can happen weeks to months after starting the medication. Contact your care team right away if you notice fevers or flu-like symptoms with a rash. The rash may be red or purple and then turn into blisters or peeling of the skin. Or, you might notice a red rash with swelling of the face, lips, or lymph nodes in your neck or under your arms. ?This medication may affect blood sugar. Ask your care team if changes in diet or medications are needed if you have diabetes. ?Patients and their families should watch out for new or worsening depression or thoughts of suicide. This includes sudden changes in mood, behaviors, or thoughts. These changes can happen at any time but are more common in the beginning of treatment or after a change in dose. Call your care team right away if you experience these thoughts or worsening depression. ?Avoid alcoholic drinks while taking this medication. Drinking large amounts of alcoholic beverages, using sleeping or anxiety medications, or quickly stopping the use of these agents while taking this medication may increase your risk for a seizure. ?Do not drive or use heavy machinery until you know how this medication affects you. This medication can impair your ability to perform these tasks. ?Inform your care team if you wish to become pregnant or think you might be pregnant. Losing weight while pregnant is not advised and may cause harm to the unborn child. Talk to your care team for more information. ?What side effects may I notice from receiving this medication? ?Side effects that you should report to your care team as soon as possible: ?Allergic reactions--skin rash, itching, hives, swelling of the face, lips, tongue, or throat ?Fast or irregular heartbeat ?Increase in blood pressure ?Liver injury--right upper  belly pain, loss of appetite, nausea, light-colored stool, dark yellow or brown urine, yellowing skin or eyes, unusual weakness or fatigue ?Mood and behavior changes--anxiety, nervousness, confusion, hallucinations, irritability, hostility, thoughts of suicide or self-harm, worsening mood, feelings of depression ?Redness, blistering, peeling, or loosening of the skin, including inside the mouth ?Seizures ?Sudden eye pain or change in vision such as blurry vision, seeing halos around lights, vision loss ?Side effects that usually do not require medical attention (report to your care team if they continue or are bothersome): ?Constipation ?Dizziness ?Dry mouth ?Fatigue ?Headache ?Nausea ?Trouble sleeping ?Vomiting ?This list may not describe all possible side effects. Call your doctor for medical advice about side effects. You may report side effects to FDA at 1-800-FDA-1088. ?Where should I keep my medication? ?Keep out of the reach of children and pets. ?Store between 15 and 30 degrees C (59 and 86 degrees F). Get rid of any unused medication after the expiration date. ?To get rid of medications that are no longer needed or have expired: ?Take the medication to a medication take-back program. Check with your pharmacy or law enforcement to find a location. ?If you cannot return the medication, check the label or package insert to see if the medication should be thrown out in the garbage or flushed down the toilet. If you are not sure, ask your care team. If it is safe to put it in the trash, pour the medication out of the container. Mix the medication with cat litter, dirt, coffee grounds, or other unwanted substance. Seal the mixture in  a bag or container. Put it in the trash. ?NOTE: This sheet is a summary. It may not cover all possible information. If you have questions about this medicine, talk to your doctor, pharmacist, or health care provider. ?? 2023 Elsevier/Gold Standard (2021-10-16 00:00:00) ? ?

## 2022-01-15 ENCOUNTER — Other Ambulatory Visit: Payer: Self-pay | Admitting: Physician Assistant

## 2022-01-15 ENCOUNTER — Encounter: Payer: Self-pay | Admitting: Physician Assistant

## 2022-01-15 DIAGNOSIS — R519 Headache, unspecified: Secondary | ICD-10-CM | POA: Insufficient documentation

## 2022-01-18 ENCOUNTER — Emergency Department
Admission: RE | Admit: 2022-01-18 | Discharge: 2022-01-18 | Disposition: A | Discharge status: Home / Self Care | Payer: 59 | Source: Ambulatory Visit | Attending: Family Medicine | Admitting: Family Medicine

## 2022-01-18 VITALS — BP 118/74 | HR 71 | Temp 98.0°F | Resp 18

## 2022-01-18 DIAGNOSIS — R202 Paresthesia of skin: Secondary | ICD-10-CM

## 2022-01-18 DIAGNOSIS — R200 Anesthesia of skin: Secondary | ICD-10-CM

## 2022-01-18 MED ORDER — METHYLPREDNISOLONE ACETATE 80 MG/ML IJ SUSP
80.0000 mg | Freq: Once | INTRAMUSCULAR | Status: AC
  Administered 2022-01-18: 80 mg via INTRAMUSCULAR

## 2022-01-18 NOTE — Discharge Instructions (Signed)
You should start to see improvement within 12 to 24 hours This is a pinched nerve symptoms should resolve completely If you have recurring problems, or severe problems you may need a nerve conduction study See J Breeback in follow-up

## 2022-01-18 NOTE — ED Triage Notes (Signed)
Pt here today c/o RT arm pain and numbness that started today. Started in elbow area and started to radiate in both directions as well as starting to cause tingling in fingers.

## 2022-01-18 NOTE — ED Provider Notes (Signed)
[name]    CSN: [id number] Arrival date & time: 01/18/22  1748      History   Chief Complaint Chief Complaint  Patient presents with   Arm Pain    APPT 6PM RT, numbness and pain    HPI Denise Craig is a 51 y.o. female.   HPI  Patient works in an office.  Largely in a clinical setting.  She states she was working on the computer a lot today.  During her workday she developed tingling and numbness in an irritated feeling in her fingers.  She states all 4 fingers on her right hand sparing her thumb feel tingly.  Normal strength.  Normal dexterity.  No fall or injury.  No pain in neck or shoulder.  Some pain in the medial elbow.  Never had pain like this before.  She is not diabetic.  She is postmenopausal.  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   Uterine polyp     Patient Active Problem List   Diagnosis Date Noted   Sinus headache 01/15/2022   Intractable migraine without aura and with status migrainosus 01/10/2022   Pre-diabetes 10/13/2021   Moderate episode of recurrent major depressive disorder ([place]) 09/21/2020   Poor concentration 01/21/2018   Menopause 09/24/2016   Vitamin D deficiency 09/24/2016   Hot flashes 09/21/2016   Family history of breast cancer 09/21/2016   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   Anxiety and depression 33/54/5625   Periumbilical abdominal pain 01/25/2016   Nephrolithiasis 07/14/2013   Gastroesophageal reflux disease 09/17/2012   Essential hypertension 02/27/2011   IBS  06/16/2009   INSOMNIA 07/15/2008   HIATAL HERNIA 12/22/2007   THYROMEGALY 12/11/2007   ANEMIA, VITAMIN B12 DEFICIENCY 06/04/2007   Iron deficiency anemia 05/16/2007   ASTHMA, PERSISTENT, MODERATE 05/16/2007   Class 1 obesity due to excess calories without serious comorbidity with body mass index (BMI) of 34.0 to 34.9 in adult 06/11/2006   Anxiety state 06/11/2006   Migraine headache 06/11/2006   ENDOMETRIOSIS 06/11/2006    Past Surgical History:  Procedure Laterality Date   APPENDECTOMY     endometrial ablation spring  2008   GASTRIC BYPASS  2013   HYSTEROSCOPY WITH NOVASURE  08/12/2012   Procedure: HYSTEROSCOPY WITH NOVASURE;  Surgeon: Marylynn Pearson, MD;  Location: [facility];  Service: Gynecology;  Laterality: N/A;   LAPAROSCOPIC TUBAL LIGATION  08/12/2012   Procedure: LAPAROSCOPIC TUBAL LIGATION;  Surgeon: Marylynn Pearson, MD;  Location: [facility];  Service: Gynecology;  Laterality: Bilateral;  WITH FILSHIE CLIPS   right knee lateral release surgery  2011   TONSILLECTOMY     tumor removed  2012   removed right ankle    OB History   No obstetric history on file.      Home Medications    Prior to Admission medications   Medication Sig Start Date End Date Taking? Authorizing Provider  ALPRAZolam Duanne Moron) 1 MG tablet Take 1 tablet (1  mg total) by mouth 2 (two) times daily as needed for anxiety. 06/09/21   Breeback, Jade L, PA-C  Atogepant (QULIPTA) 60 MG TABS Take 1 tablet by mouth daily. For migraine prevention. 03/31/21   Breeback, Jade L, PA-C  busPIRone (BUSPAR) 15 MG tablet Take 1 tablet (15 mg total) by mouth 2 (two) times daily. 10/13/21   Breeback, Jade L, PA-C  doxepin (SINEQUAN) 10 MG capsule Take 1 capsule (10 mg total) by mouth at bedtime. Start once tapered off remeron. 01/10/22   Breeback, Royetta Car, PA-C  lisinopril-hydrochlorothiazide (ZESTORETIC) 20-12.5 MG tablet Take 1 tablet by mouth daily. 03/31/21   Breeback, Jade L, PA-C  montelukast (SINGULAIR) 10 MG tablet TAKE 1  TABLET BY MOUTH AT  BEDTIME 12/22/21   Breeback, Jade L, PA-C  Multiple Vitamins-Minerals (MULTIVITAMIN WITH MINERALS) tablet Take 1 tablet by mouth daily.     [provider]  Naltrexone-buPROPion HCl ER 8-90 MG TB12 1 tab daily for week 1, then 1 tab BID for week 2, then 2 tab PO qAM and 1 tab PO qPM for week 3, then 2 tabs BID. 01/10/22   Breeback, Jade L, PA-C  Naltrexone-buPROPion HCl ER 8-90 MG TB12 Take 2 tablets by mouth 2 (two) times daily. 01/10/22   Breeback, Jade L, PA-C  ondansetron (ZOFRAN) 8 MG tablet Take 1 tablet (8 mg total) by mouth every 8 (eight) hours as needed for nausea or vomiting. 10/13/21   Breeback, Jade L, PA-C  pantoprazole (PROTONIX) 40 MG tablet Take 1 tablet (40 mg total) by mouth 2 (two) times daily. 10/13/21   Breeback, Jade L, PA-C  rizatriptan (MAXALT) 10 MG tablet Take 1 tablet (10 mg total) by mouth as needed for migraine. May repeat in 2 hours if needed 10/13/21   Breeback, Jade L, PA-C  Vitamin D, Ergocalciferol, (DRISDOL) 1.25 MG (50000 UNIT) CAPS capsule Take 1 capsule (50,000 Units total) by mouth every 7 (seven) days. Take for 12 total doses(weeks) than can transition to 1000 units OTC supplement daily 10/18/21   Breeback, Jade L, PA-C  vortioxetine HBr (TRINTELLIX) 20 MG TABS tablet Take 1 tablet (20 mg total) by mouth daily. 01/10/22   Donella Stade, PA-C    Family History Family History  Problem Relation Age of Onset   Hypertension Mother    Diabetes Mother    Breast cancer Mother    Cancer Mother 49       breast cancer   Heart failure Mother    Cancer Father        metastatic, colon ca   Breast cancer Maternal Aunt    Breast cancer Paternal Aunt    Breast cancer Maternal Grandmother    Breast cancer Paternal Grandmother     Social History Social History   Tobacco Use   Smoking status: Never   Smokeless tobacco: Never  Substance Use Topics   Alcohol use: No   Drug use: No     Allergies   Ambien [zolpidem], Gabapentin,  Morphine and related, and Ozempic (0.25 or 0.5 mg-dose) [semaglutide(0.25 or 0.5mg -dos)]   Review of Systems Review of Systems See HPI  Physical Exam Triage Vital Signs ED Triage Vitals  Enc Vitals Group     BP 01/18/22 1800 118/74     Pulse Rate 01/18/22 1800 71     Resp 01/18/22 1800 18     Temp 01/18/22 1800 98 F (36.7 C)     Temp Source 01/18/22 1800 Oral     SpO2 01/18/22  1800 99 %     Weight --      Height --      Head Circumference --      Peak Flow --      Pain Score 01/18/22 1801 5     Pain Loc --      Pain Edu? --      Excl. in [place]? --    No data found.  Updated Vital Signs BP 118/74 (BP Location: Left Arm)   Pulse 71   Temp 98 F (36.7 C) (Oral)   Resp 18   SpO2 99%      Physical Exam Constitutional:      General: She is not in acute distress.    Appearance: She is well-developed. She is obese.  HENT:     Head: Normocephalic and atraumatic.  Eyes:     Conjunctiva/sclera: Conjunctivae normal.     Pupils: Pupils are equal, round, and reactive to light.  Cardiovascular:     Rate and Rhythm: Normal rate.  Pulmonary:     Effort: Pulmonary effort is normal. No respiratory distress.  Abdominal:     General: There is no distension.     Palpations: Abdomen is soft.  Musculoskeletal:        General: Normal range of motion.     Cervical back: Normal range of motion.     Comments: Good range of motion of the neck.  Negative Spurling.  No tenderness in the paraspinous or cervical musculature.  Normal shoulder and elbow exam.  Negative Tinel's over the ulnar nerve at the elbow, Guyon's canal.  Phalen's and Tinel's are negative  Skin:    General: Skin is warm and dry.  Neurological:     General: No focal deficit present.     Mental Status: She is alert.     Cranial Nerves: No cranial nerve deficit.     Sensory: No sensory deficit.     Motor: No weakness.     Coordination: Coordination normal.     Deep Tendon Reflexes: Reflexes normal.     Comments:  Patient reports that the 4 fingers on her right hand are numb however she can feel sharp and dull, touch to these fingers.  Normal grip strength.  Psychiatric:        Mood and Affect: Mood normal.        Behavior: Behavior normal.     UC Treatments / Results  Labs (all labs ordered are listed, but only abnormal results are displayed) Labs Reviewed - No data to display  EKG   Radiology No results found.  Procedures Procedures (including critical care time)  Medications Ordered in UC Medications  methylPREDNISolone acetate (DEPO-MEDROL) injection 80 mg (80 mg Intramuscular Given 01/18/22 1838)    Initial Impression / Assessment and Plan / UC Course  I have reviewed the triage vital signs and the nursing notes.  Pertinent labs & imaging results that were available during my care of the patient were reviewed by me and considered in my medical decision making (see chart for details).     Uncertain why she would have numbness in all 4 fingers.  Negative testing forCubital tunnel.  Negative testing for carpal tunnel.  We will treat with a steroid shot and have her follow-up with her primary care Final Clinical Impressions(s) / UC Diagnoses   Final diagnoses:  Numbness and tingling in right hand     Discharge Instructions      You should start to see improvement  within 12 to 24 hours This is a pinched nerve symptoms should resolve completely If you have recurring problems, or severe problems you may need a nerve conduction study See J Breeback in follow-up   ED Prescriptions   None    PDMP not reviewed this encounter.   Raylene Everts, MD 01/18/22 1911

## 2022-02-07 ENCOUNTER — Encounter: Payer: Self-pay | Admitting: Physician Assistant

## 2022-02-20 ENCOUNTER — Encounter: Payer: Self-pay | Admitting: Physician Assistant

## 2022-02-20 DIAGNOSIS — R110 Nausea: Secondary | ICD-10-CM

## 2022-02-20 MED ORDER — ONDANSETRON 8 MG PO TBDP: 8.0000 mg | ORAL_TABLET | Freq: Three times a day (TID) | ORAL | 0 refills | Status: DC | PRN

## 2022-02-20 NOTE — Telephone Encounter (Signed)
Routing to covering provider. Requesting a formulary change for ondansetron. Updated rx pended.

## 2022-02-26 ENCOUNTER — Telehealth: Payer: Self-pay | Admitting: Neurology

## 2022-02-26 NOTE — Telephone Encounter (Signed)
Agree with below. Addendum:    I believe that she has limits with moving and including, toileting, bathing, feeding, dressing and grooming. I believe the power wheelchair is needed for pt to be able to perform ADL's in her home

## 2022-03-07 ENCOUNTER — Other Ambulatory Visit: Payer: Self-pay | Admitting: Neurology

## 2022-03-07 DIAGNOSIS — G43009 Migraine without aura, not intractable, without status migrainosus: Secondary | ICD-10-CM

## 2022-03-07 MED ORDER — QULIPTA 60 MG PO TABS: 1.0000 | ORAL_TABLET | Freq: Every day | ORAL | 0 refills | Status: DC

## 2022-03-13 MED ORDER — AIMOVIG 70 MG/ML ~~LOC~~ SOAJ: 70.0000 mg | SUBCUTANEOUS | 5 refills | Status: DC

## 2022-03-13 NOTE — Addendum Note (Signed)
Addended by: Donella Stade on: 03/13/2022 04:11 PM   Modules accepted: Orders

## 2022-03-15 ENCOUNTER — Other Ambulatory Visit: Payer: Self-pay | Admitting: Physician Assistant

## 2022-03-15 DIAGNOSIS — I10 Essential (primary) hypertension: Secondary | ICD-10-CM

## 2022-03-21 ENCOUNTER — Other Ambulatory Visit: Payer: Self-pay | Admitting: Physician Assistant

## 2022-03-21 DIAGNOSIS — F5101 Primary insomnia: Secondary | ICD-10-CM

## 2022-03-29 ENCOUNTER — Telehealth: Payer: Self-pay

## 2022-03-29 NOTE — Telephone Encounter (Signed)
Prior Authorization for Aimovig submitted via covermymeds. Awaiting response.

## 2022-03-29 NOTE — Telephone Encounter (Addendum)
Initiated Prior authorization [id number] 70MG /ML auto-injectors Via: Covermymeds Case/Key:[id number] Status: approved  as of 03/29/22 Reason:approved through 09/29/2022 Notified Pt via: Mychart

## 2022-04-04 ENCOUNTER — Encounter: Payer: Self-pay | Admitting: Neurology

## 2022-04-10 ENCOUNTER — Other Ambulatory Visit: Payer: Self-pay | Admitting: Neurology

## 2022-04-10 ENCOUNTER — Telehealth: Payer: Self-pay | Admitting: Physician Assistant

## 2022-04-10 DIAGNOSIS — N184 Chronic kidney disease, stage 4 (severe): Secondary | ICD-10-CM | POA: Insufficient documentation

## 2022-04-10 DIAGNOSIS — G459 Transient cerebral ischemic attack, unspecified: Secondary | ICD-10-CM | POA: Insufficient documentation

## 2022-04-10 DIAGNOSIS — G43009 Migraine without aura, not intractable, without status migrainosus: Secondary | ICD-10-CM

## 2022-04-10 MED ORDER — QULIPTA 60 MG PO TABS: 1.0000 | ORAL_TABLET | Freq: Every day | ORAL | 0 refills | Status: DC

## 2022-04-10 NOTE — Telephone Encounter (Signed)
Pt called in this afternoon concerned about prescription request. She received a text confirming a refill request for Qulipta from [facility].  She did not request a refill because insurance does not cover this. Can it be removed from her list of medications,so this doesn't happen again? She called [facility], they told her that we requested the refill. I explained that is was an auto generated request from [facility] sent to [place].

## 2022-04-11 NOTE — Telephone Encounter (Signed)
Note added to chart not to fill at pharmacy.

## 2022-04-12 ENCOUNTER — Telehealth: Payer: Self-pay | Admitting: Physician Assistant

## 2022-04-12 NOTE — Telephone Encounter (Signed)
Pt called to schedule hospital f/u. She was admitted for stroke like symptoms. She was diagnosed with a severe migraine. Provider at the hospital suggested some medication changes. They also advised her to stop taking her BP medication, however she does not feel comfortable with that suggestion, any recommendations for her? She is scheduled on 08/14 at 2:40 for a hospital f/u.

## 2022-04-16 ENCOUNTER — Telehealth: Payer: Self-pay | Admitting: General Practice

## 2022-04-16 ENCOUNTER — Ambulatory Visit: Payer: 59 | Admitting: Physician Assistant

## 2022-04-16 VITALS — BP 107/69 | HR 90 | Ht 66.0 in | Wt 209.0 lb

## 2022-04-16 DIAGNOSIS — G43011 Migraine without aura, intractable, with status migrainosus: Secondary | ICD-10-CM | POA: Diagnosis not present

## 2022-04-16 DIAGNOSIS — N184 Chronic kidney disease, stage 4 (severe): Secondary | ICD-10-CM

## 2022-04-16 DIAGNOSIS — G43009 Migraine without aura, not intractable, without status migrainosus: Secondary | ICD-10-CM | POA: Diagnosis not present

## 2022-04-16 DIAGNOSIS — G459 Transient cerebral ischemic attack, unspecified: Secondary | ICD-10-CM

## 2022-04-16 DIAGNOSIS — I10 Essential (primary) hypertension: Secondary | ICD-10-CM

## 2022-04-16 DIAGNOSIS — I959 Hypotension, unspecified: Secondary | ICD-10-CM

## 2022-04-16 MED ORDER — DEXAMETHASONE SODIUM PHOSPHATE 10 MG/ML IJ SOLN
8.0000 mg | Freq: Once | INTRAMUSCULAR | Status: AC
  Administered 2022-04-16: 8 mg via INTRAMUSCULAR

## 2022-04-16 MED ORDER — ZAVZPRET 10 MG/ACT NA SOLN: 1.0000 | Freq: Once | NASAL | 5 refills | Status: AC

## 2022-04-16 NOTE — Telephone Encounter (Signed)
Transition Care Management Follow-up Telephone Call Date of discharge and from where: 04/11/22 from [facility] How have you been since you were released from the hospital? Patient has an OV with PCP today at 240pm Any questions or concerns? No

## 2022-04-16 NOTE — Progress Notes (Unsigned)
Established Patient Office Visit  Subjective   Patient ID: Denise Craig, female    DOB: 1971/04/04  Age: 51 y.o. MRN: [id number]  Chief Complaint  Patient presents with   Hospitalization Follow-up    Migraine vs TIA    HPI Pt is a 51 yo female with long history of migraines who presents to the clinic after ED and admission visit for Migraine vs TIA on 04/10/2022. She was discharged on 04/11/2022.   She presented with speech difficultly and bilateral leg and motor control issues.   Lab work showed sodium 133, potassium 4 BUN 35 creatinine 7, GFR 29, WBC 7.1 RBC 3.62, H&H 10.0/32.3%, MCV 89. CT code stroke head without contrast showed: No acute intracranial abnormality. Specifically, CT evidence of acute blood or infarct CT angio head and neck showed: CTA neck demonstrates no significant carotid or vertebral artery stenosis. Intracranially, there is no large vessel occlusion or significant stenosis. No aneurysm.   Pt has had dull headache above left eye since ED visit.   She is scheduled to see neurology in October.  Pt is also running into problems with insurance and coverage.  She has qulipta until the end of the month and then she has to transition to [employer]. Ajovy worked the best but insurance will not cover. Nurteq not covered.  Maxalt not helping for rescue like it was.   Her BP is low. Off lisinopril/HCTZ from hospital. Not checking BP at home.   .. Active Ambulatory Problems    Diagnosis Date Noted   THYROMEGALY 12/11/2007   Class 1 obesity due to excess calories without serious comorbidity with body mass index (BMI) of 34.0 to 34.9 in adult 06/11/2006   Iron deficiency anemia 05/16/2007   ANEMIA, VITAMIN B12 DEFICIENCY 06/04/2007   Anxiety state 06/11/2006   Migraine headache 06/11/2006   ASTHMA, PERSISTENT, MODERATE 05/16/2007   HIATAL HERNIA 12/22/2007   IBS 06/16/2009   ENDOMETRIOSIS 06/11/2006   INSOMNIA 07/15/2008   Essential hypertension 02/27/2011    Gastroesophageal reflux disease 09/17/2012   Nephrolithiasis 02/77/4128   Periumbilical abdominal pain 01/25/2016   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   Anxiety and depression 06/01/2016   Hot flashes 09/21/2016   Family history of breast cancer 09/21/2016   Menopause 09/24/2016   Vitamin D deficiency 09/24/2016   Poor concentration 01/21/2018   Moderate episode of recurrent major depressive disorder ([place]) 09/21/2020   Pre-diabetes 10/13/2021   Intractable migraine without aura and with status migrainosus 01/10/2022   Sinus headache 01/15/2022   TIA (transient ischemic attack) 04/10/2022   CKD stage G4/A3, 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) 04/10/2022   Arthralgia of right knee 08/23/2020   Hypotension 04/17/2022   Resolved Ambulatory Problems    Diagnosis Date Noted   Leg edema 01/02/2011   At risk for impaired digestion 09/21/2016   Low iron stores 09/24/2016   Fidgeting 01/21/2018   Chest tightness 01/21/2018   No energy 01/21/2018   Nausea 10/13/2021   Past Medical History:  Diagnosis Date   Anxiety    Asthma    B12 deficiency    Depression    GERD (gastroesophageal reflux disease)    Infertility, female    Iron deficiency    Migraine    PONV (postoperative nausea and vomiting)    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    Objective:     BP 107/69   Pulse 90  Ht 5\' 6"  (1.676 m)   Wt 209 lb (94.8 kg)   SpO2 100%   BMI 33.73 kg/m  BP Readings from Last 3 Encounters:  04/16/22 107/69  01/18/22 118/74  01/10/22 (!) 91/54   Wt Readings from Last 3 Encounters:  04/16/22 209 lb (94.8 kg)  01/10/22 214 lb (97.1 kg)  10/13/21 222 lb (100.7 kg)      Physical Exam Constitutional:      Appearance: Normal appearance. She is obese.  HENT:     Head: Normocephalic.  Cardiovascular:     Rate and Rhythm: Normal rate and regular rhythm.     Pulses: Normal pulses.     Heart sounds: Normal heart  sounds.  Pulmonary:     Effort: Pulmonary effort is normal.     Breath sounds: Normal breath sounds.  Musculoskeletal:     Right lower leg: No edema.     Left lower leg: No edema.     Comments: Upper and lower ext 5/5 strength.  NROM of all extremities.   Neurological:     General: No focal deficit present.     Mental Status: She is alert and oriented to person, place, and time.  Psychiatric:        Mood and Affect: Mood normal.         Assessment & Plan:  Marland KitchenMarland KitchenTahni was seen today for hospitalization follow-up.  Diagnoses and all orders for this visit:  Migraine without aura and without status migrainosus, not intractable -     Zavegepant HCl (ZAVZPRET) 10 MG/ACT SOLN; Place 1 spray into the nose once for 1 dose. No more than 1 spray in 24 hours. 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 Recheck kidney function. Ordered renal U/S. Saw dip in renal function back in February but patient did not have kidney function rechecked.  BP low readings. Stay off lisinopril/HcTZ  Monitor BP and let me know if above 130/90.  Transition to aimovig from qulipta end of this month Trial of zavapret for rescue BP looks great Needs labs for stroke risk reduction CTA of neck showed no significant stenosis of carotids   Return in about 2 weeks (around 04/30/2022).    Iran Planas, PA-C

## 2022-04-16 NOTE — Patient Instructions (Signed)
Stop lisinopril/HCTZ.  Keep checking BP. If starting to elevated above 130/90 we could add back.

## 2022-04-17 ENCOUNTER — Other Ambulatory Visit: Payer: Self-pay | Admitting: Physician Assistant

## 2022-04-17 ENCOUNTER — Telehealth: Payer: Self-pay

## 2022-04-17 ENCOUNTER — Encounter: Payer: Self-pay | Admitting: Physician Assistant

## 2022-04-17 DIAGNOSIS — I959 Hypotension, unspecified: Secondary | ICD-10-CM | POA: Insufficient documentation

## 2022-04-17 DIAGNOSIS — N184 Chronic kidney disease, stage 4 (severe): Secondary | ICD-10-CM

## 2022-04-17 LAB — BASIC METABOLIC PANEL WITH GFR
BUN/Creatinine Ratio: 13 (calc) (ref 6–22)
BUN: 29 mg/dL — ABNORMAL HIGH (ref 7–25)
CO2: 22 mmol/L (ref 20–32)
Calcium: 8.9 mg/dL (ref 8.6–10.4)
Chloride: 109 mmol/L (ref 98–110)
Creat: 2.19 mg/dL — ABNORMAL HIGH (ref 0.50–1.03)
Glucose, Bld: 108 mg/dL — ABNORMAL HIGH (ref 65–99)
Potassium: 4.7 mmol/L (ref 3.5–5.3)
Sodium: 141 mmol/L (ref 135–146)
eGFR: 27 mL/min/{1.73_m2} — ABNORMAL LOW (ref >=60)

## 2022-04-17 NOTE — Progress Notes (Signed)
GFR has decreased from hospital visit. Your kidneys are not working like they should. Get renal ultrasound but I am making referral to nephrology as well

## 2022-04-17 NOTE — Telephone Encounter (Signed)
Patient called requesting if provider can send in an alternative medication for migraines. Patient stated she was seen in Urgent care. She was informed that her urine come back positive for E. Coli. She was given a rx for sulfamethoxazole/trimethoprim 800/160 mg. Patient is asking if there is any thing she needs to do to prevent future infections.

## 2022-04-18 ENCOUNTER — Encounter: Payer: Self-pay | Admitting: Physician Assistant

## 2022-04-18 ENCOUNTER — Ambulatory Visit (INDEPENDENT_AMBULATORY_CARE_PROVIDER_SITE_OTHER): Payer: 59

## 2022-04-18 DIAGNOSIS — N184 Chronic kidney disease, stage 4 (severe): Secondary | ICD-10-CM

## 2022-04-18 MED ORDER — ZOLMITRIPTAN 5 MG PO TBDP: 5.0000 mg | ORAL_TABLET | ORAL | 2 refills | Status: DC | PRN

## 2022-04-19 ENCOUNTER — Encounter: Payer: Self-pay | Admitting: Physician Assistant

## 2022-04-19 NOTE — Progress Notes (Signed)
No acute findings.  There is some structural changing of the right kidney.  Do you have appt with nephrology yet?

## 2022-04-25 ENCOUNTER — Other Ambulatory Visit: Payer: Self-pay | Admitting: Physician Assistant

## 2022-04-25 DIAGNOSIS — F5101 Primary insomnia: Secondary | ICD-10-CM

## 2022-05-04 ENCOUNTER — Encounter: Payer: Self-pay | Admitting: Physician Assistant

## 2022-05-04 ENCOUNTER — Ambulatory Visit: Payer: 59 | Admitting: Physician Assistant

## 2022-05-04 VITALS — BP 131/77 | HR 73 | Ht 66.0 in | Wt 216.0 lb

## 2022-05-04 DIAGNOSIS — Z23 Encounter for immunization: Secondary | ICD-10-CM

## 2022-05-04 DIAGNOSIS — N184 Chronic kidney disease, stage 4 (severe): Secondary | ICD-10-CM

## 2022-05-04 DIAGNOSIS — G43009 Migraine without aura, not intractable, without status migrainosus: Secondary | ICD-10-CM

## 2022-05-04 DIAGNOSIS — R93421 Abnormal radiologic findings on diagnostic imaging of right kidney: Secondary | ICD-10-CM

## 2022-05-04 DIAGNOSIS — G43011 Migraine without aura, intractable, with status migrainosus: Secondary | ICD-10-CM

## 2022-05-04 LAB — POCT URINALYSIS DIP (CLINITEK)
Bilirubin, UA: NEGATIVE
Blood, UA: NEGATIVE
Glucose, UA: NEGATIVE mg/dL
Ketones, POC UA: NEGATIVE mg/dL
Leukocytes, UA: NEGATIVE
Nitrite, UA: NEGATIVE
POC PROTEIN,UA: NEGATIVE
Spec Grav, UA: 1.025 (ref 1.010–1.025)
Urobilinogen, UA: 0.2 E.U./dL
pH, UA: 5.5 (ref 5.0–8.0)

## 2022-05-04 MED ORDER — TRAMADOL HCL 50 MG PO TABS: 100.0000 mg | ORAL_TABLET | Freq: Four times a day (QID) | ORAL | 0 refills | Status: AC | PRN

## 2022-05-04 MED ORDER — AIMOVIG 140 MG/ML ~~LOC~~ SOAJ: 140.0000 mg | SUBCUTANEOUS | 5 refills | Status: DC

## 2022-05-04 NOTE — Progress Notes (Signed)
Normal urinalysis.

## 2022-05-04 NOTE — Patient Instructions (Signed)
Stop using any NSAIDs Tramadol as needed for pain Increased aimovig to 140mg  monthly

## 2022-05-04 NOTE — Progress Notes (Signed)
Established Patient Office Visit  Subjective   Patient ID: Denise Craig, female    DOB: [birth year]  Age: 51 y.o. MRN: [id number]  Chief Complaint  Patient presents with   Migraine   Anxiety    HPI Pt is a 51 yo female with HTN, migraines, Asthma, IDA, MDD, GAD, pre-diabetes, and new CKD.   Pt continues to have mild daily headaches. She is using aimovig 70 monthly with zomig for rescue. She sees neurology for migraines.   New kidney function dropping issues. Pt denies taking any NSAIDs. Abnormal cortical findings on renal ultrasound. She has not seen nephrology at this time.     .. Active Ambulatory Problems    Diagnosis Date Noted   THYROMEGALY 12/11/2007   Class 1 obesity due to excess calories without serious comorbidity with body mass index (BMI) of 34.0 to 34.9 in adult 06/11/2006   Iron deficiency anemia 05/16/2007   ANEMIA, VITAMIN B12 DEFICIENCY 06/04/2007   Anxiety state 06/11/2006   Migraine headache 06/11/2006   ASTHMA, PERSISTENT, MODERATE 05/16/2007   HIATAL HERNIA 12/22/2007   IBS 06/16/2009   ENDOMETRIOSIS 06/11/2006   INSOMNIA 07/15/2008   Essential hypertension 02/27/2011   Gastroesophageal reflux disease 09/17/2012   Nephrolithiasis 17/61/6073   Periumbilical abdominal pain 01/25/2016   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   Anxiety and depression 06/01/2016   Hot flashes 09/21/2016   Family history of breast cancer 09/21/2016   Menopause 09/24/2016   Vitamin D deficiency 09/24/2016   Poor concentration 01/21/2018   Moderate episode of recurrent major depressive disorder ([place]) 09/21/2020   Pre-diabetes 10/13/2021   Intractable migraine without aura and with status migrainosus 01/10/2022   Sinus headache 01/15/2022   TIA (transient ischemic attack) 04/10/2022   CKD stage G4/A3, 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) 04/10/2022   Arthralgia of right knee 08/23/2020   Hypotension 04/17/2022   Abnormal finding on diagnostic  imaging of right kidney 05/04/2022   Resolved Ambulatory Problems    Diagnosis Date Noted   Leg edema 01/02/2011   At risk for impaired digestion 09/21/2016   Low iron stores 09/24/2016   Fidgeting 01/21/2018   Chest tightness 01/21/2018   No energy 01/21/2018   Nausea 10/13/2021   Past Medical History:  Diagnosis Date   Anxiety    Asthma    B12 deficiency    Depression    GERD (gastroesophageal reflux disease)    Infertility, female    Iron deficiency    Migraine    PONV (postoperative nausea and vomiting)    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    Objective:     BP 131/77   Pulse 73   Ht 5\' 6"  (1.676 m)   Wt 216 lb (98 kg)   SpO2 100%   BMI 34.86 kg/m  BP Readings from Last 3 Encounters:  05/04/22 131/77  04/16/22 107/69  01/18/22 118/74   Wt Readings from Last 3 Encounters:  05/04/22 216 lb (98 kg)  04/16/22 209 lb (94.8 kg)  01/10/22 214 lb (97.1 kg)      Physical Exam Constitutional:      Appearance: Normal appearance. She is obese.  HENT:     Head: Normocephalic.  Cardiovascular:     Rate and Rhythm: Normal rate and regular rhythm.     Pulses: Normal pulses.     Heart sounds: Normal heart sounds.  Pulmonary:     Effort: Pulmonary  effort is normal.     Breath sounds: Normal breath sounds.  Musculoskeletal:     Right lower leg: No edema.     Left lower leg: No edema.  Neurological:     General: No focal deficit present.     Mental Status: She is alert and oriented to person, place, and time.  Psychiatric:        Mood and Affect: Mood normal.           Assessment & Plan:  Marland KitchenMarland KitchenLeyanna was seen today for migraine and anxiety.  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 -     Erenumab-aooe (AIMOVIG) 140 MG/ML SOAJ; Inject 140 mg into the skin every 30 (thirty) days.  Intractable migraine without aura and with status migrainosus -     traMADol (ULTRAM) 50 MG tablet; Take 2 tablets (100 mg total) by mouth every 6 (six) hours as needed for up to 5 days. -     Erenumab-aooe (AIMOVIG) 140 MG/ML SOAJ; Inject 140 mg into the skin every 30 (thirty) days.   No NSAIds for any reason.  Tramadol as needed for breakthrough pain Increased aimovig to 140mg  monthly Repeat urine/kidney function test-keep appt with nephrology Make sure to stay hydrated Flu and tdap given today.    Return in about 3 months (around 08/03/2022).    Iran Planas, PA-C

## 2022-05-05 LAB — URINALYSIS, MICROSCOPIC ONLY
Bacteria, UA: NONE SEEN /HPF
Hyaline Cast: NONE SEEN /LPF
RBC / HPF: NONE SEEN /HPF (ref 0–2)

## 2022-05-05 LAB — BASIC METABOLIC PANEL WITH GFR
BUN/Creatinine Ratio: 11 (calc) (ref 6–22)
BUN: 20 mg/dL (ref 7–25)
CO2: 24 mmol/L (ref 20–32)
Calcium: 8.5 mg/dL — ABNORMAL LOW (ref 8.6–10.4)
Chloride: 111 mmol/L — ABNORMAL HIGH (ref 98–110)
Creat: 1.86 mg/dL — ABNORMAL HIGH (ref 0.50–1.03)
Glucose, Bld: 104 mg/dL — ABNORMAL HIGH (ref 65–99)
Potassium: 5 mmol/L (ref 3.5–5.3)
Sodium: 142 mmol/L (ref 135–146)
eGFR: 32 mL/min/{1.73_m2} — ABNORMAL LOW (ref >=60)

## 2022-05-08 NOTE — Progress Notes (Signed)
Denise Craig,   Your kidney function has improved since 3 weeks ago. Keep hydrating and staying away from NSAIDs.   Nothing abnormal seen in urine.

## 2022-05-09 ENCOUNTER — Encounter: Payer: Self-pay | Admitting: Physician Assistant

## 2022-05-10 ENCOUNTER — Telehealth: Payer: Self-pay

## 2022-05-10 NOTE — Telephone Encounter (Signed)
Initiated Prior authorization BUL:AGTXMIWO 10MG /ACT solution Via: Covermymeds Case/Key: [id number] Status: denied  as of 05/10/22 Reason:denied for not meeting the prior authorization requirement(s) Notified Pt via: Mychart

## 2022-05-11 NOTE — Telephone Encounter (Signed)
Forms completed, copy sent to scan, copy to hold. Given to front desk to collect payment.   

## 2022-05-14 NOTE — Telephone Encounter (Signed)
Called pt to collect the payment. She stated that she would send her husband here to pay the payment and collect the paperwork. Tvt

## 2022-05-19 ENCOUNTER — Encounter: Payer: Self-pay | Admitting: Physician Assistant

## 2022-05-21 ENCOUNTER — Encounter: Payer: Self-pay | Admitting: Physician Assistant

## 2022-05-28 ENCOUNTER — Encounter: Payer: Self-pay | Admitting: Physician Assistant

## 2022-05-29 ENCOUNTER — Other Ambulatory Visit: Payer: Self-pay | Admitting: Physician Assistant

## 2022-05-29 DIAGNOSIS — F5101 Primary insomnia: Secondary | ICD-10-CM

## 2022-06-11 ENCOUNTER — Ambulatory Visit
Admission: EM | Admit: 2022-06-11 | Discharge: 2022-06-11 | Disposition: A | Discharge status: Home / Self Care | Payer: 59 | Attending: Urgent Care | Admitting: Urgent Care

## 2022-06-11 ENCOUNTER — Encounter: Payer: Self-pay | Admitting: Emergency Medicine

## 2022-06-11 DIAGNOSIS — R112 Nausea with vomiting, unspecified: Secondary | ICD-10-CM

## 2022-06-11 DIAGNOSIS — B349 Viral infection, unspecified: Secondary | ICD-10-CM

## 2022-06-11 LAB — POC SARS CORONAVIRUS 2 AG -  ED: SARS Coronavirus 2 Ag: NEGATIVE

## 2022-06-11 LAB — POCT INFLUENZA A/B
Influenza A, POC: NEGATIVE
Influenza B, POC: NEGATIVE

## 2022-06-11 MED ORDER — DEXAMETHASONE SODIUM PHOSPHATE 10 MG/ML IJ SOLN
10.0000 mg | Freq: Once | INTRAMUSCULAR | Status: AC
  Administered 2022-06-11: 10 mg via INTRAMUSCULAR

## 2022-06-11 MED ORDER — KETOROLAC TROMETHAMINE 30 MG/ML IJ SOLN
30.0000 mg | Freq: Once | INTRAMUSCULAR | Status: AC
  Administered 2022-06-11: 30 mg via INTRAMUSCULAR

## 2022-06-11 NOTE — Discharge Instructions (Addendum)
Your flu and COVID test are both negative. It is possible that you have adenovirus or parainfluenza, both of which are viral infections that run their course. Treatment is supportive, which includes small sips of water, brat diet, Zofran as needed. Out of work today.  May return once feeling better You were given Decadron and Toradol in our office to help with your headache. Please go home and rest.  Return to our clinic if any new or worsening symptoms develops.

## 2022-06-11 NOTE — ED Triage Notes (Signed)
Pt states she woke up vomiting this am, having headaches and body aches but denied fever. She has only vomited a couple times this am. Denies nausea now took 4mg  zofran at home. She states she had congestion last week and has been taking sinus meds for that.

## 2022-06-11 NOTE — ED Provider Notes (Signed)
[name]    CSN: [id number] Arrival date & time: 06/11/22  0957      History   Chief Complaint Chief Complaint  Patient presents with   Vomiting    HPI Denise Craig is a 51 y.o. female.   Pleasant 51 year old female presents today due to concerns of a headache, nasal congestion, and vomiting.  She states that on Thursday she started having a headache and congestion.  Took some over-the-counter medications and it seemed to resolve by Friday morning.  She also took a COVID test at home on Thursday roughly 4 hours after symptoms started, which was negative.  She did have a COVID exposure last week.  She works in the police department and records.  Patient states the remainder of Friday, all of Saturday, and Sunday morning she felt fine.  Last evening, she started developing a migraine and nausea.  She woke up early this morning vomiting.  She does have a history of migraines, took a Zomig last evening, and states it did not resolve her pain.  Took a Zofran this morning, and has not vomited since.  Reports nausea resolved with Zofran.  Patient reports onset of body aches this morning, but no fever. No nucchal rigidity or photophobia.     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   Uterine polyp     Patient Active Problem List   Diagnosis Date Noted   Abnormal finding on diagnostic imaging of right kidney 05/04/2022   Hypotension 04/17/2022   TIA (transient ischemic attack) 04/10/2022   CKD stage G4/A3, 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) 04/10/2022   Sinus headache 01/15/2022   Intractable migraine without aura and with status migrainosus 01/10/2022   Pre-diabetes 10/13/2021   Moderate episode of recurrent major depressive disorder ([place]) 09/21/2020   Arthralgia of right knee 08/23/2020   Poor concentration 01/21/2018   Menopause 09/24/2016   Vitamin D deficiency 09/24/2016   Hot flashes 09/21/2016   Family history of breast cancer 09/21/2016   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   Anxiety and depression 84/16/6063   Periumbilical abdominal pain 01/25/2016   Nephrolithiasis 07/14/2013   Gastroesophageal reflux disease 09/17/2012   Essential hypertension 02/27/2011   IBS 06/16/2009   INSOMNIA 07/15/2008   HIATAL HERNIA 12/22/2007   THYROMEGALY 12/11/2007   ANEMIA, VITAMIN B12 DEFICIENCY 06/04/2007   Iron deficiency anemia 05/16/2007   ASTHMA, PERSISTENT, MODERATE 05/16/2007   Class 1 obesity due to excess calories without serious comorbidity with body mass index (BMI) of 34.0 to 34.9 in adult 06/11/2006   Anxiety state 06/11/2006   Migraine headache 06/11/2006   ENDOMETRIOSIS 06/11/2006    Past Surgical History:  Procedure Laterality Date   APPENDECTOMY     endometrial ablation spring  2008   GASTRIC BYPASS  2013   HYSTEROSCOPY WITH NOVASURE  08/12/2012   Procedure: HYSTEROSCOPY WITH NOVASURE;  Surgeon: Marylynn Pearson, MD;  Location: [facility];  Service:  Gynecology;  Laterality: N/A;   LAPAROSCOPIC TUBAL LIGATION  08/12/2012   Procedure: LAPAROSCOPIC TUBAL LIGATION;  Surgeon: Marylynn Pearson, MD;  Location: [facility];  Service: Gynecology;  Laterality: Bilateral;  WITH FILSHIE CLIPS   right knee lateral release surgery  2011   TONSILLECTOMY     tumor removed  2012   removed right ankle    OB History   No obstetric history on file.      Home Medications    Prior to Admission medications   Medication Sig Start Date End Date Taking? Authorizing Provider  ALPRAZolam Duanne Moron)  1 MG tablet Take 1 tablet (1 mg total) by mouth 2 (two) times daily as needed for anxiety. 06/09/21   Breeback, Jade L, PA-C  busPIRone (BUSPAR) 15 MG tablet Take 1 tablet (15 mg total) by mouth 2 (two) times daily. 10/13/21   Breeback, Jade L, PA-C  doxepin (SINEQUAN) 10 MG capsule Take 1 capsule (10 mg total) by mouth at bedtime. 05/29/22   Breeback, Jade L, PA-C  Erenumab-aooe (AIMOVIG) 140 MG/ML SOAJ Inject 140 mg into the skin every 30 (thirty) days. 05/04/22   Breeback, Jade L, PA-C  Erenumab-aooe (AIMOVIG) 70 MG/ML SOAJ Inject 70 mg into the skin every 30 (thirty) days. 03/13/22   Breeback, Jade L, PA-C  montelukast (SINGULAIR) 10 MG tablet TAKE 1 TABLET BY MOUTH AT  BEDTIME 12/22/21   Breeback, Jade L, PA-C  Multiple Vitamins-Minerals (MULTIVITAMIN WITH MINERALS) tablet Take 1 tablet by mouth daily.     [provider]  ondansetron (ZOFRAN-ODT) 8 MG disintegrating tablet Take 1 tablet (8 mg total) by mouth every 8 (eight) hours as needed for nausea or vomiting. 02/20/22   Breeback, Jade L, PA-C  pantoprazole (PROTONIX) 40 MG tablet Take 1 tablet (40 mg total) by mouth 2 (two) times daily. 10/13/21   Breeback, Jade L, PA-C  rizatriptan (MAXALT) 10 MG tablet Take 1 tablet (10 mg total) by mouth as needed for migraine. May repeat in 2 hours if needed 10/13/21   Breeback, Jade L, PA-C  Vitamin D, Ergocalciferol, (DRISDOL) 1.25 MG (50000 UNIT) CAPS capsule Take 1 capsule (50,000 Units total) by mouth every 7 (seven) days. Take for 12 total doses(weeks) than can transition to 1000 units OTC supplement daily 10/18/21   Breeback, Jade L, PA-C  vortioxetine HBr (TRINTELLIX) 20 MG TABS tablet Take 1 tablet (20 mg total) by mouth daily. 01/10/22   Breeback, Jade L, PA-C  zolmitriptan (ZOMIG ZMT) 5 MG disintegrating tablet Take 1 tablet (5 mg total) by mouth as needed for migraine. 04/18/22   Donella Stade, PA-C    Family History Family History  Problem Relation Age of Onset   Hypertension Mother     Diabetes Mother    Breast cancer Mother    Cancer Mother 35       breast cancer   Heart failure Mother    Cancer Father        metastatic, colon ca   Breast cancer Maternal Aunt    Breast cancer Paternal Aunt    Breast cancer Maternal Grandmother    Breast cancer Paternal Grandmother     Social History Social History   Tobacco Use   Smoking status: Never   Smokeless tobacco: Never  Substance Use Topics   Alcohol use: No   Drug use: No     Allergies   Ambien [zolpidem], Gabapentin, Morphine and related, and Ozempic (0.25 or 0.5 mg-dose) [semaglutide(0.25 or 0.5mg -dos)]   Review of Systems  Review of Systems  HENT:  Positive for congestion.   Gastrointestinal:  Positive for nausea and vomiting.  Musculoskeletal:  Positive for myalgias.  Neurological:  Positive for headaches.  All other systems reviewed and are negative.    Physical Exam Triage Vital Signs ED Triage Vitals  Enc Vitals Group     BP 06/11/22 1013 (!) 149/89     Pulse Rate 06/11/22 1013 79     Resp 06/11/22 1013 16     Temp 06/11/22 1013 97.9 F (36.6 C)     Temp Source 06/11/22 1013 Oral     SpO2 06/11/22 1013 98 %     Weight --      Height --      Head Circumference --      Peak Flow --      Pain Score 06/11/22 1014 0     Pain Loc --      Pain Edu? --      Excl. in [place]? --    No data found.  Updated Vital Signs BP (!) 149/89 (BP Location: Right Arm)   Pulse 79   Temp 97.9 F (36.6 C) (Oral)   Resp 16   SpO2 98%   Visual Acuity Right Eye Distance:   Left Eye Distance:   Bilateral Distance:    Right Eye Near:   Left Eye Near:    Bilateral Near:     Physical Exam Vitals and nursing note reviewed.  Constitutional:      General: She is not in acute distress.    Appearance: Normal appearance. She is well-developed. She is obese. She is ill-appearing. She is not toxic-appearing or diaphoretic.  HENT:     Head: Normocephalic and atraumatic.     Right Ear: Tympanic membrane, ear  canal and external ear normal. There is no impacted cerumen.     Left Ear: Tympanic membrane, ear canal and external ear normal. There is no impacted cerumen.     Nose: Nose normal. No congestion or rhinorrhea.     Mouth/Throat:     Mouth: Mucous membranes are moist.     Pharynx: Oropharynx is clear. No oropharyngeal exudate or posterior oropharyngeal erythema.  Eyes:     General: No scleral icterus.       Right eye: No discharge.        Left eye: No discharge.     Extraocular Movements: Extraocular movements intact.     Conjunctiva/sclera: Conjunctivae normal.     Pupils: Pupils are equal, round, and reactive to light.  Cardiovascular:     Rate and Rhythm: Normal rate and regular rhythm.     Pulses: Normal pulses.     Heart sounds: Normal heart sounds. No murmur heard.    No friction rub.  Pulmonary:     Effort: Pulmonary effort is normal. No respiratory distress.     Breath sounds: Normal breath sounds. No stridor. No wheezing or rhonchi.  Chest:     Chest wall: No tenderness.  Abdominal:     General: Abdomen is flat. Bowel sounds are normal.     Palpations: Abdomen is soft.     Tenderness: There is no abdominal tenderness. There is no guarding or rebound.  Musculoskeletal:        General: No swelling.     Cervical back: Normal range of motion and neck supple. No rigidity or tenderness.  Lymphadenopathy:     Cervical: No cervical adenopathy.  Skin:    General: Skin is warm and  dry.     Capillary Refill: Capillary refill takes less than 2 seconds.     Coloration: Skin is not jaundiced.     Findings: No erythema or rash.  Neurological:     General: No focal deficit present.     Mental Status: She is alert and oriented to person, place, and time.  Psychiatric:        Mood and Affect: Mood normal.      UC Treatments / Results  Labs (all labs ordered are listed, but only abnormal results are displayed) Labs Reviewed  POCT INFLUENZA A/B - Normal  POC SARS CORONAVIRUS 2  AG -  ED - Normal    EKG   Radiology No results found.  Procedures Procedures (including critical care time)  Medications Ordered in UC Medications  ketorolac (TORADOL) 30 MG/ML injection 30 mg (30 mg Intramuscular Given 06/11/22 1044)  dexamethasone (DECADRON) injection 10 mg (10 mg Intramuscular Given 06/11/22 1044)    Initial Impression / Assessment and Plan / UC Course  I have reviewed the triage vital signs and the nursing notes.  Pertinent labs & imaging results that were available during my care of the patient were reviewed by me and considered in my medical decision making (see chart for details).     Acute viral syndrome - covid and flu test negative in office. Suspect viral syndrome. PT was given decadron and toradol in office to aid with migraine symptoms. Supportive measures appear appropriate at this time.  N/V - possibly secondary to migraine vs viral syndrome. Pt has had no further sx since taking zofran at home this morning. Does not need refill, has plenty of medication at home. Monitor. RTC precautions reviewed with pt.  Final Clinical Impressions(s) / UC Diagnoses   Final diagnoses:  Acute viral syndrome  Nausea and vomiting, unspecified vomiting type     Discharge Instructions      Your flu and COVID test are both negative. It is possible that you have adenovirus or parainfluenza, both of which are viral infections that run their course. Treatment is supportive, which includes small sips of water, brat diet, Zofran as needed. Out of work today.  May return once feeling better You were given Decadron and Toradol in our office to help with your headache. Please go home and rest.  Return to our clinic if any new or worsening symptoms develops.     ED Prescriptions   None    PDMP not reviewed this encounter.   Chaney Malling, [place] 06/11/22 1206

## 2022-06-12 ENCOUNTER — Telehealth: Payer: Self-pay

## 2022-06-12 NOTE — Telephone Encounter (Signed)
Called pt to check on status since UC visit. States shes feeling a little better. Advised to call back if any questions or concerns.

## 2022-06-19 LAB — CBC AND DIFFERENTIAL
HCT: 30 — AB (ref 36–46)
Hemoglobin: 8.9 — AB (ref 12.0–16.0)
Neutrophils Absolute: 62
Platelets: 237 10*3/uL (ref 150–400)
WBC: 5.2

## 2022-06-19 LAB — BASIC METABOLIC PANEL
BUN: 14 (ref 4–21)
CO2: 22 (ref 13–22)
Chloride: 106 (ref 99–108)
Creatinine: 1.3 — AB (ref 0.5–1.1)
Glucose: 94
Potassium: 4 mEq/L (ref 3.5–5.1)
Sodium: 142 (ref 137–147)

## 2022-06-19 LAB — COMPREHENSIVE METABOLIC PANEL
Albumin: 4.1 (ref 3.5–5.0)
Calcium: 8.9 (ref 8.7–10.7)
eGFR: 48

## 2022-06-19 LAB — IRON,TIBC AND FERRITIN PANEL
%SAT: 6
Ferritin: 7
Iron: 24
TIBC: 375
UIBC: 351

## 2022-06-19 LAB — CBC: RBC: 3.61 — AB (ref 3.87–5.11)

## 2022-06-19 LAB — PROTEIN / CREATININE RATIO, URINE: Creatinine, Urine: 131.1

## 2022-06-28 ENCOUNTER — Encounter: Payer: Self-pay | Admitting: Neurology

## 2022-07-02 ENCOUNTER — Encounter: Payer: Self-pay | Admitting: Physician Assistant

## 2022-07-04 NOTE — Telephone Encounter (Signed)
Patient called and left vm about this. Did you receive note from kidney MD about what medication they recommend for blood pressure?

## 2022-07-06 MED ORDER — AMLODIPINE BESYLATE 2.5 MG PO TABS: 2.5000 mg | ORAL_TABLET | Freq: Every day | ORAL | 0 refills | Status: DC

## 2022-07-16 ENCOUNTER — Ambulatory Visit: Payer: 59 | Admitting: Physician Assistant

## 2022-07-16 ENCOUNTER — Encounter: Payer: Self-pay | Admitting: Physician Assistant

## 2022-07-16 VITALS — BP 125/76 | HR 78 | Temp 98.0°F | Ht 66.0 in | Wt 209.0 lb

## 2022-07-16 DIAGNOSIS — J398 Other specified diseases of upper respiratory tract: Secondary | ICD-10-CM | POA: Diagnosis not present

## 2022-07-16 DIAGNOSIS — J0100 Acute maxillary sinusitis, unspecified: Secondary | ICD-10-CM

## 2022-07-16 DIAGNOSIS — R051 Acute cough: Secondary | ICD-10-CM | POA: Diagnosis not present

## 2022-07-16 LAB — POC COVID19 BINAXNOW: SARS Coronavirus 2 Ag: NEGATIVE

## 2022-07-16 LAB — POCT INFLUENZA A/B
Influenza A, POC: NEGATIVE
Influenza B, POC: NEGATIVE

## 2022-07-16 MED ORDER — AMOXICILLIN-POT CLAVULANATE 875-125 MG PO TABS: 1.0000 | ORAL_TABLET | Freq: Two times a day (BID) | ORAL | 0 refills | Status: DC

## 2022-07-16 MED ORDER — HYDROCOD POLI-CHLORPHE POLI ER 10-8 MG/5ML PO SUER: 5.0000 mL | Freq: Two times a day (BID) | ORAL | 0 refills | Status: DC | PRN

## 2022-07-16 NOTE — Progress Notes (Signed)
Acute Office Visit  Subjective:     Patient ID: Denise Craig, female    DOB: 02/20/1971, 51 y.o.   MRN: [id number]  Chief Complaint  Patient presents with   Cough    congestion    HPI Patient is in today for cough, congestion, headache, sinus pressure, ear popping for 5 days. Her husband was sick first and then she started with symptoms. No problems breathing. She is having problems sleeping due to cough. No recent fever. Negative for covid with home test.    .. Active Ambulatory Problems    Diagnosis Date Noted   THYROMEGALY 12/11/2007   Class 1 obesity due to excess calories without serious comorbidity with body mass index (BMI) of 34.0 to 34.9 in adult 06/11/2006   Iron deficiency anemia 05/16/2007   ANEMIA, VITAMIN B12 DEFICIENCY 06/04/2007   Anxiety state 06/11/2006   Migraine headache 06/11/2006   ASTHMA, PERSISTENT, MODERATE 05/16/2007   HIATAL HERNIA 12/22/2007   IBS 06/16/2009   ENDOMETRIOSIS 06/11/2006   INSOMNIA 07/15/2008   Essential hypertension 02/27/2011   Gastroesophageal reflux disease 09/17/2012   Nephrolithiasis 16/03/3709   Periumbilical abdominal pain 01/25/2016   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   Anxiety and depression 06/01/2016   Hot flashes 09/21/2016   Family history of breast cancer 09/21/2016   Menopause 09/24/2016   Vitamin D deficiency 09/24/2016   Poor concentration 01/21/2018   Moderate episode of recurrent major depressive disorder ([place]) 09/21/2020   Pre-diabetes 10/13/2021   Intractable migraine without aura and with status migrainosus 01/10/2022   Sinus headache 01/15/2022   TIA (transient ischemic attack) 04/10/2022   CKD stage G4/A3, 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) 04/10/2022   Arthralgia of right knee 08/23/2020   Hypotension 04/17/2022   Abnormal finding on diagnostic imaging of right kidney 05/04/2022   Resolved Ambulatory Problems    Diagnosis Date Noted   Leg edema 01/02/2011   At  risk for impaired digestion 09/21/2016   Low iron stores 09/24/2016   Fidgeting 01/21/2018   Chest tightness 01/21/2018   No energy 01/21/2018   Nausea 10/13/2021   Past Medical History:  Diagnosis Date   Anxiety    Asthma    B12 deficiency    Depression    GERD (gastroesophageal reflux disease)    Infertility, female    Iron deficiency    Migraine    PONV (postoperative nausea and vomiting)    Status post gastrectomy 05/12/12   Uterine polyp      ROS  See HPI.     Objective:    BP 125/76   Pulse 78   Temp 98 F (36.7 C) (Oral)   Ht 5\' 6"  (1.676 m)   Wt 209 lb (94.8 kg)   SpO2 100%   BMI 33.73 kg/m  BP Readings from Last 3 Encounters:  07/16/22 125/76  06/11/22 (!) 149/89  05/04/22 131/77   Wt Readings from Last 3 Encounters:  07/16/22 209 lb (94.8 kg)  05/04/22 216 lb (98 kg)  04/16/22 209 lb (94.8 kg)     .Marland Kitchen Results for orders placed or performed in visit on 07/16/22  POC COVID-19  Result Value Ref Range   SARS Coronavirus 2 Ag Negative Negative  POCT Influenza A/B  Result Value Ref Range   Influenza A, POC Negative Negative   Influenza B, POC Negative Negative    Physical Exam Constitutional:      Appearance: Normal appearance.  HENT:     Head:  Normocephalic.     Right Ear: Tympanic membrane, ear canal and external ear normal. There is no impacted cerumen.     Left Ear: Tympanic membrane, ear canal and external ear normal. There is no impacted cerumen.     Nose: Congestion present. No rhinorrhea.     Mouth/Throat:     Mouth: Mucous membranes are moist.     Pharynx: Posterior oropharyngeal erythema present. No oropharyngeal exudate.  Eyes:     Conjunctiva/sclera: Conjunctivae normal.     Pupils: Pupils are equal, round, and reactive to light.  Neck:     Vascular: No carotid bruit.  Cardiovascular:     Rate and Rhythm: Normal rate and regular rhythm.     Heart sounds: Murmur heard.  Pulmonary:     Effort: Pulmonary effort is normal.      Breath sounds: Normal breath sounds.  Musculoskeletal:     Cervical back: Normal range of motion and neck supple. No tenderness.  Lymphadenopathy:     Cervical: No cervical adenopathy.  Neurological:     General: No focal deficit present.     Mental Status: She is alert and oriented to person, place, and time.  Psychiatric:        Mood and Affect: Mood normal.          Assessment & Plan:  Marland KitchenMarland KitchenZyiah was seen today for cough.  Diagnoses and all orders for this visit:  Acute non-recurrent maxillary sinusitis -     amoxicillin-clavulanate (AUGMENTIN) 875-125 MG tablet; Take 1 tablet by mouth 2 (two) times daily.  Acute cough -     POC COVID-19 -     POCT Influenza A/B -     chlorpheniramine-HYDROcodone (TUSSIONEX) 10-8 MG/5ML; Take 5 mLs by mouth every 12 (twelve) hours as needed for cough (cough, will cause drowsiness.).  Congestion of upper respiratory tract -     POC COVID-19 -     POCT Influenza A/B   Covid and Flu are negative  Vitals look good Symptoms approaching a week, start augmentin.  Tussionex for cough at bedtime Flonase added Written out of work for today Rest and hydrate Follow up as needed or if symptoms persist  Iran Planas, PA-C

## 2022-07-16 NOTE — Patient Instructions (Signed)

## 2022-07-18 ENCOUNTER — Telehealth: Payer: Self-pay

## 2022-07-18 ENCOUNTER — Encounter: Payer: Self-pay | Admitting: Physician Assistant

## 2022-07-18 MED ORDER — PREDNISONE 20 MG PO TABS: ORAL_TABLET | ORAL | 0 refills | Status: DC

## 2022-07-18 NOTE — Telephone Encounter (Signed)
Patient called and left a voice mail message.  States she was seen in office on Monday and told by Iran Planas, PA if not any better by Wednesday to give her a call . She states she is not improved at ll.  She has takent the medication given on Monday and Tuesday. Patient contact # [phone]

## 2022-07-19 NOTE — Telephone Encounter (Signed)
Patient informed. 

## 2022-07-31 ENCOUNTER — Encounter: Payer: Self-pay | Admitting: Physician Assistant

## 2022-08-01 ENCOUNTER — Other Ambulatory Visit: Payer: Self-pay | Admitting: Physician Assistant

## 2022-08-01 DIAGNOSIS — F419 Anxiety disorder, unspecified: Secondary | ICD-10-CM

## 2022-08-03 ENCOUNTER — Ambulatory Visit: Payer: 59 | Admitting: Physician Assistant

## 2022-08-16 ENCOUNTER — Other Ambulatory Visit: Payer: Self-pay | Admitting: Physician Assistant

## 2022-08-16 DIAGNOSIS — K219 Gastro-esophageal reflux disease without esophagitis: Secondary | ICD-10-CM

## 2022-08-17 ENCOUNTER — Ambulatory Visit: Payer: 59 | Admitting: Physician Assistant

## 2022-08-28 ENCOUNTER — Encounter: Payer: Self-pay | Admitting: Physician Assistant

## 2022-08-28 ENCOUNTER — Ambulatory Visit (INDEPENDENT_AMBULATORY_CARE_PROVIDER_SITE_OTHER): Payer: 59 | Admitting: Physician Assistant

## 2022-08-28 VITALS — BP 137/71 | HR 88 | Ht 66.0 in | Wt 213.0 lb

## 2022-08-28 DIAGNOSIS — G43009 Migraine without aura, not intractable, without status migrainosus: Secondary | ICD-10-CM

## 2022-08-28 DIAGNOSIS — I10 Essential (primary) hypertension: Secondary | ICD-10-CM | POA: Diagnosis not present

## 2022-08-28 DIAGNOSIS — D649 Anemia, unspecified: Secondary | ICD-10-CM

## 2022-08-28 DIAGNOSIS — Z23 Encounter for immunization: Secondary | ICD-10-CM

## 2022-08-28 DIAGNOSIS — N184 Chronic kidney disease, stage 4 (severe): Secondary | ICD-10-CM | POA: Diagnosis not present

## 2022-08-28 MED ORDER — RIZATRIPTAN BENZOATE 10 MG PO TABS: 10.0000 mg | ORAL_TABLET | ORAL | 5 refills | Status: DC | PRN

## 2022-08-28 MED ORDER — DEXAMETHASONE SODIUM PHOSPHATE 10 MG/ML IJ SOLN
10.0000 mg | Freq: Once | INTRAMUSCULAR | Status: AC
  Administered 2022-08-28: 10 mg via INTRAMUSCULAR

## 2022-08-28 MED ORDER — AMLODIPINE BESYLATE 5 MG PO TABS: 5.0000 mg | ORAL_TABLET | Freq: Every day | ORAL | 0 refills | Status: DC

## 2022-08-28 NOTE — Patient Instructions (Signed)
Increase norvasc to 5mg  daily for blood pressure.

## 2022-08-28 NOTE — Progress Notes (Signed)
Established Patient Office Visit  Subjective   Patient ID: Denise Craig, female    DOB: 1971/07/07  Age: 51 y.o. MRN: [id number]  Chief Complaint  Patient presents with   Follow-up    HPI Pt is a 51 yo female with HTN, Asthma, Migraines, anemia, CKD-4 who presents to the clinic to follow up on BP.   She has seen nephrology and released for a year. Kidney function stable at 1.4 serum creatinine per patient.  BP at home has been elevated more times than not with 140s over 80s readings. No CP, palpitations. She is having her 2nd iron infusion for anemia. She has had migraine since iron infusion. No OTC or prescription medications are helping her migraine.   .. Active Ambulatory Problems    Diagnosis Date Noted   THYROMEGALY 12/11/2007   Class 1 obesity due to excess calories without serious comorbidity with body mass index (BMI) of 34.0 to 34.9 in adult 06/11/2006   Iron deficiency anemia 05/16/2007   ANEMIA, VITAMIN B12 DEFICIENCY 06/04/2007   Anxiety state 06/11/2006   Migraine headache 06/11/2006   ASTHMA, PERSISTENT, MODERATE 05/16/2007   HIATAL HERNIA 12/22/2007   IBS 06/16/2009   ENDOMETRIOSIS 06/11/2006   INSOMNIA 07/15/2008   Essential hypertension 02/27/2011   Gastroesophageal reflux disease 09/17/2012   Nephrolithiasis 63/84/5364   Periumbilical abdominal pain 01/25/2016   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   Anxiety and depression 06/01/2016   Hot flashes 09/21/2016   Family history of breast cancer 09/21/2016   Menopause 09/24/2016   Vitamin D deficiency 09/24/2016   Poor concentration 01/21/2018   Moderate episode of recurrent major depressive disorder ([place]) 09/21/2020   Pre-diabetes 10/13/2021   Intractable migraine without aura and with status migrainosus 01/10/2022   Sinus headache 01/15/2022   TIA (transient ischemic attack) 04/10/2022   CKD stage G4/A3, 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) 04/10/2022   Arthralgia of right  knee 08/23/2020   Hypotension 04/17/2022   Abnormal finding on diagnostic imaging of right kidney 05/04/2022   Resolved Ambulatory Problems    Diagnosis Date Noted   Leg edema 01/02/2011   At risk for impaired digestion 09/21/2016   Low iron stores 09/24/2016   Fidgeting 01/21/2018   Chest tightness 01/21/2018   No energy 01/21/2018   Nausea 10/13/2021   Past Medical History:  Diagnosis Date   Anxiety    Asthma    B12 deficiency    Depression    GERD (gastroesophageal reflux disease)    Infertility, female    Iron deficiency    Migraine    PONV (postoperative nausea and vomiting)    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    Objective:     BP 137/71   Pulse 88   Ht 5\' 6"  (1.676 m)   Wt 213 lb (96.6 kg)   SpO2 99%   BMI 34.38 kg/m  BP Readings from Last 3 Encounters:  08/28/22 137/71  07/16/22 125/76  06/11/22 (!) 149/89   Wt Readings from Last 3 Encounters:  08/28/22 213 lb (96.6 kg)  07/16/22 209 lb (94.8 kg)  05/04/22 216 lb (98 kg)      Physical Exam Constitutional:      Appearance: Normal appearance. She is obese.  HENT:     Head: Normocephalic.  Cardiovascular:     Rate and Rhythm: Normal rate and regular rhythm.  Pulmonary:     Effort: Pulmonary effort  is normal.  Neurological:     Mental Status: She is alert.  Psychiatric:        Mood and Affect: Mood normal.        Assessment & Plan:  Denise Craig KitchenAnaliyah was seen today for follow-up.  Diagnoses and all orders for this visit:  Essential hypertension -     amLODipine (NORVASC) 5 MG tablet; Take 1 tablet (5 mg total) by mouth daily.  Need for shingles vaccine -     Varicella-zoster vaccine IM  Migraine without aura and without status migrainosus, not intractable -     rizatriptan (MAXALT) 10 MG tablet; Take 1 tablet (10 mg total) by mouth as needed for migraine. May repeat in 2 hours if needed  Anemia, unspecified type -     CBC w/Diff/Platelet -      Fe+TIBC+Fer -     BASIC METABOLIC PANEL WITH GFR  CKD stage G4/A3, 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) -     BASIC METABOLIC PANEL WITH GFR   Migraine likely SE to infusion  Decadron given today 10mg  IM  BP better in office but high readings at home Goal 130/80 or under Increased norvasc to 5mg  daily Continue to check at home  Labs ordered to have checked after infusions are finished  Continue to follow up with nephology BMP ordered today after infusion to check GFR   Denise Planas, PA-C

## 2022-08-28 NOTE — Addendum Note (Signed)
Addended byAnnamaria Helling on: 08/28/2022 01:03 PM   Modules accepted: Orders

## 2022-08-29 ENCOUNTER — Other Ambulatory Visit: Payer: Self-pay | Admitting: Physician Assistant

## 2022-08-29 DIAGNOSIS — G43011 Migraine without aura, intractable, with status migrainosus: Secondary | ICD-10-CM

## 2022-08-29 DIAGNOSIS — G43009 Migraine without aura, not intractable, without status migrainosus: Secondary | ICD-10-CM

## 2022-08-29 NOTE — Telephone Encounter (Signed)
Not on medication list She was seen yesterday

## 2022-09-11 ENCOUNTER — Other Ambulatory Visit: Payer: Self-pay | Admitting: Physician Assistant

## 2022-09-25 ENCOUNTER — Telehealth: Payer: Self-pay

## 2022-09-25 NOTE — Telephone Encounter (Addendum)
Initiated Prior authorization TYO:MAYOKHT 70MG /ML auto-injectors Via: Covermymeds Case/Key:[id number] Status: approved as of 10/09/22 Reason:Aimovig approved through 09/26/2023.  Notified Pt via: Mychart

## 2022-09-29 LAB — CBC WITH DIFFERENTIAL/PLATELET
Absolute Monocytes: 327 cells/uL (ref 200–950)
Basophils Absolute: 69 cells/uL (ref 0–200)
Basophils Relative: 2.1 %
Eosinophils Absolute: 208 cells/uL (ref 15–500)
Eosinophils Relative: 6.3 %
HCT: 36.5 % (ref 35.0–45.0)
Hemoglobin: 11.6 g/dL — ABNORMAL LOW (ref 11.7–15.5)
Lymphs Abs: 828 cells/uL — ABNORMAL LOW (ref 850–3900)
MCH: 27.4 pg (ref 27.0–33.0)
MCHC: 31.8 g/dL — ABNORMAL LOW (ref 32.0–36.0)
MCV: 86.1 fL (ref 80.0–100.0)
MPV: 11.5 fL (ref 7.5–12.5)
Monocytes Relative: 9.9 %
Neutro Abs: 1868 cells/uL (ref 1500–7800)
Neutrophils Relative %: 56.6 %
Platelets: 179 10*3/uL (ref 140–400)
RBC: 4.24 10*6/uL (ref 3.80–5.10)
RDW: 19.6 % — ABNORMAL HIGH (ref 11.0–15.0)
Total Lymphocyte: 25.1 %
WBC: 3.3 10*3/uL — ABNORMAL LOW (ref 3.8–10.8)

## 2022-09-29 LAB — IRON,TIBC AND FERRITIN PANEL
%SAT: 29 % (calc) (ref 16–45)
Ferritin: 103 ng/mL (ref 16–232)
Iron: 80 ug/dL (ref 45–160)
TIBC: 276 mcg/dL (calc) (ref 250–450)

## 2022-09-29 LAB — BASIC METABOLIC PANEL WITH GFR
BUN/Creatinine Ratio: 13 (calc) (ref 6–22)
BUN: 18 mg/dL (ref 7–25)
CO2: 26 mmol/L (ref 20–32)
Calcium: 9.3 mg/dL (ref 8.6–10.4)
Chloride: 106 mmol/L (ref 98–110)
Creat: 1.37 mg/dL — ABNORMAL HIGH (ref 0.50–1.03)
Glucose, Bld: 86 mg/dL (ref 65–99)
Potassium: 4.1 mmol/L (ref 3.5–5.3)
Sodium: 140 mmol/L (ref 135–146)
eGFR: 47 mL/min/{1.73_m2} — ABNORMAL LOW (ref >=60)

## 2022-10-01 NOTE — Progress Notes (Signed)
Denise Craig,   Iron and ferritin look good.  Kidney function stable.  Glucose looks good.  Hemoglobin much better.

## 2022-11-22 ENCOUNTER — Other Ambulatory Visit: Payer: Self-pay | Admitting: Physician Assistant

## 2022-11-23 ENCOUNTER — Ambulatory Visit: Payer: 59 | Admitting: Physician Assistant

## 2022-11-28 ENCOUNTER — Other Ambulatory Visit: Payer: Self-pay | Admitting: Physician Assistant

## 2022-11-28 DIAGNOSIS — G43009 Migraine without aura, not intractable, without status migrainosus: Secondary | ICD-10-CM

## 2022-11-28 DIAGNOSIS — G43011 Migraine without aura, intractable, with status migrainosus: Secondary | ICD-10-CM

## 2022-12-02 ENCOUNTER — Other Ambulatory Visit: Payer: Self-pay | Admitting: Physician Assistant

## 2022-12-02 DIAGNOSIS — I10 Essential (primary) hypertension: Secondary | ICD-10-CM

## 2022-12-14 ENCOUNTER — Encounter: Payer: Self-pay | Admitting: Physician Assistant

## 2022-12-14 ENCOUNTER — Ambulatory Visit: Payer: 59 | Admitting: Physician Assistant

## 2022-12-14 VITALS — BP 110/65 | HR 98 | Ht 66.0 in | Wt 214.0 lb

## 2022-12-14 DIAGNOSIS — F419 Anxiety disorder, unspecified: Secondary | ICD-10-CM | POA: Diagnosis not present

## 2022-12-14 DIAGNOSIS — N184 Chronic kidney disease, stage 4 (severe): Secondary | ICD-10-CM | POA: Diagnosis not present

## 2022-12-14 DIAGNOSIS — Z23 Encounter for immunization: Secondary | ICD-10-CM | POA: Diagnosis not present

## 2022-12-14 DIAGNOSIS — G43009 Migraine without aura, not intractable, without status migrainosus: Secondary | ICD-10-CM

## 2022-12-14 DIAGNOSIS — Z9189 Other specified personal risk factors, not elsewhere classified: Secondary | ICD-10-CM

## 2022-12-14 DIAGNOSIS — Z636 Dependent relative needing care at home: Secondary | ICD-10-CM

## 2022-12-14 DIAGNOSIS — F32A Depression, unspecified: Secondary | ICD-10-CM | POA: Diagnosis not present

## 2022-12-14 DIAGNOSIS — Z79899 Other long term (current) drug therapy: Secondary | ICD-10-CM

## 2022-12-14 MED ORDER — ALPRAZOLAM 1 MG PO TABS: 1.0000 mg | ORAL_TABLET | Freq: Two times a day (BID) | ORAL | 0 refills | Status: DC | PRN

## 2022-12-14 MED ORDER — BUSPIRONE HCL 30 MG PO TABS: 30.0000 mg | ORAL_TABLET | Freq: Two times a day (BID) | ORAL | 1 refills | Status: DC

## 2022-12-14 MED ORDER — BUSPIRONE HCL 15 MG PO TABS: 15.0000 mg | ORAL_TABLET | Freq: Two times a day (BID) | ORAL | 3 refills | Status: DC

## 2022-12-14 NOTE — Patient Instructions (Signed)
Get CMP today Increased buspar to 30mg  twice a day

## 2022-12-14 NOTE — Progress Notes (Unsigned)
Established Patient Office Visit  Subjective   Patient ID: Denise Craig, female    DOB: 1970-10-04  Age: 52 y.o. MRN: [id number]  Chief Complaint  Patient presents with   Hypertension    HPI Pt is a 52 yo obese female with HTN, Migraines, CKD, asthma, anxiety, MDD and insomnia who presents to the clinic for medication refills.   Pt sees neurology for migraines. She is on aimovig and depakote for rescue. Continues to have migraines. She is under a lot of stress. Her husband has stage 5 colon cancer. She is his caregiver. Her anxiety has been much worse. Denies worsening depression.   She denies any SOB, CP, palpitations. Not checking BP. Taking medications.     .. Active Ambulatory Problems    Diagnosis Date Noted   THYROMEGALY 12/11/2007   Class 1 obesity due to excess calories without serious comorbidity with body mass index (BMI) of 34.0 to 34.9 in adult 06/11/2006   Iron deficiency anemia 05/16/2007   ANEMIA, VITAMIN B12 DEFICIENCY 06/04/2007   Anxiety state 06/11/2006   Migraine headache 06/11/2006   ASTHMA, PERSISTENT, MODERATE 05/16/2007   HIATAL HERNIA 12/22/2007   IBS 06/16/2009   ENDOMETRIOSIS 06/11/2006   INSOMNIA 07/15/2008   Essential hypertension 02/27/2011   Gastroesophageal reflux disease 09/17/2012   Nephrolithiasis 07/14/2013   Periumbilical abdominal pain 01/25/2016   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   Anxiety and depression 06/01/2016   Hot flashes 09/21/2016   Family history of breast cancer 09/21/2016   Menopause 09/24/2016   Vitamin D deficiency 09/24/2016   Poor concentration 01/21/2018   Moderate episode of recurrent major depressive disorder 09/21/2020   Pre-diabetes 10/13/2021   Intractable migraine without aura and with status migrainosus 01/10/2022   Sinus headache 01/15/2022   TIA (transient ischemic attack) 04/10/2022   CKD stage G4/A3, 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 04/10/2022   Arthralgia of right knee  08/23/2020   Hypotension 04/17/2022   Abnormal finding on diagnostic imaging of right kidney 05/04/2022   High risk for colon cancer 12/14/2022   Caregiver stress 12/15/2022   Resolved Ambulatory Problems    Diagnosis Date Noted   Leg edema 01/02/2011   At risk for impaired digestion 09/21/2016   Low iron stores 09/24/2016   Fidgeting 01/21/2018   Chest tightness 01/21/2018   No energy 01/21/2018   Nausea 10/13/2021   Past Medical History:  Diagnosis Date   Anxiety    B12 deficiency    Depression    GERD (gastroesophageal reflux disease)    Infertility, female    Iron deficiency    Migraine    PONV (postoperative nausea and vomiting)    Status post gastrectomy 05/12/12   Uterine polyp     ROS See HPI.    Objective:     BP 110/65   Pulse 98   Ht  (1.676 m)   Wt 214 lb (97.1 kg)   SpO2 100%   BMI 34.54 kg/m  BP Readings from Last 3 Encounters:  12/14/22 110/65  08/28/22 137/71  07/16/22 125/76   Wt Readings from Last 3 Encounters:  12/14/22 214 lb (97.1 kg)  08/28/22 213 lb (96.6 kg)  07/16/22 209 lb (94.8 kg)   ..    12/14/2022   10:29 AM 08/28/2022    9:13 AM 07/16/2022   10:39 AM 05/04/2022    1:46 PM 10/13/2021   11:40 AM  Depression screen PHQ 2/9  Decreased Interest 1  Down, Depressed, Hopeless 1 0 0 1 0  PHQ - 2 Score 3 1 1 2 1   Altered sleeping 2 2  2 1   Tired, decreased energy 1 2  1 1   Change in appetite 1 1  0 1  Feeling bad or failure about yourself  1 0  0 0  Trouble concentrating 1 1  1  0  Moving slowly or fidgety/restless 0 0  0 0  Suicidal thoughts 0 0  0 0  PHQ-9 Score 9 7  6 4   Difficult doing work/chores Somewhat difficult Somewhat difficult  Not difficult at all Not difficult at all   ..    12/14/2022   10:29 AM 08/28/2022    9:13 AM 05/04/2022    1:46 PM 10/13/2021   11:41 AM  GAD 7 : Generalized Anxiety Score  Nervous, Anxious, on Edge 1 1 1 2   Control/stop worrying 1 1 1 1   Worry too much - different things 2  0 1 1  Trouble relaxing 1 1 1 1   Restless 1 0 0 0  Easily annoyed or irritable 2 1 1 2   Afraid - awful might happen 0 0 0 0  Total GAD 7 Score 8 4 5 7   Anxiety Difficulty Somewhat difficult Somewhat difficult Somewhat difficult Somewhat difficult       Physical Exam Vitals reviewed.  Constitutional:      Appearance: Normal appearance. She is obese.  HENT:     Head: Normocephalic.  Cardiovascular:     Rate and Rhythm: Normal rate and regular rhythm.  Pulmonary:     Effort: Pulmonary effort is normal.     Breath sounds: Normal breath sounds.  Neurological:     General: No focal deficit present.     Mental Status: She is alert and oriented to person, place, and time.  Psychiatric:        Mood and Affect: Mood normal.       Assessment & Plan:  Marland KitchenMarland KitchenNayra was seen today for hypertension.  Diagnoses and all orders for this visit:  Anxiety -     ALPRAZolam (XANAX) 1 MG tablet; Take 1 tablet (1 mg total) by mouth 2 (two) times daily as needed for anxiety. -     busPIRone (BUSPAR) 30 MG tablet; Take 1 tablet (30 mg total) by mouth in the morning and at bedtime.  Anxiety and depression -     Discontinue: busPIRone (BUSPAR) 15 MG tablet; Take 1 tablet (15 mg total) by mouth 2 (two) times daily.  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   Continue to follow up with neurology for migraines.   Shingles vaccine given today, repeat in 2-6 months for next dose.   Vitals look good.   GAD and PHQ elevated but pt is a current caregiver Max dose of trintellix Increased buspar to 30mg   bid Follow up in 3 months Consider counseling to help with management and self care.   Check cmp and kidney function    Tandy Gaw, PA-C

## 2022-12-15 DIAGNOSIS — Z636 Dependent relative needing care at home: Secondary | ICD-10-CM | POA: Insufficient documentation

## 2022-12-15 LAB — COMPLETE METABOLIC PANEL WITH GFR
AG Ratio: 1.6 (calc) (ref 1.0–2.5)
ALT: 21 U/L (ref 6–29)
AST: 21 U/L (ref 10–35)
Albumin: 4.1 g/dL (ref 3.6–5.1)
Alkaline phosphatase (APISO): 67 U/L (ref 37–153)
BUN/Creatinine Ratio: 12 (calc) (ref 6–22)
BUN: 21 mg/dL (ref 7–25)
CO2: 28 mmol/L (ref 20–32)
Calcium: 9.3 mg/dL (ref 8.6–10.4)
Chloride: 104 mmol/L (ref 98–110)
Creat: 1.76 mg/dL — ABNORMAL HIGH (ref 0.50–1.03)
Globulin: 2.5 g/dL (calc) (ref 1.9–3.7)
Glucose, Bld: 57 mg/dL — ABNORMAL LOW (ref 65–99)
Potassium: 4.2 mmol/L (ref 3.5–5.3)
Sodium: 142 mmol/L (ref 135–146)
Total Bilirubin: 0.3 mg/dL (ref 0.2–1.2)
Total Protein: 6.6 g/dL (ref 6.1–8.1)
eGFR: 35 mL/min/{1.73_m2} — ABNORMAL LOW (ref >=60)

## 2022-12-17 NOTE — Progress Notes (Signed)
Kidney function worsened a bit. Make sure you are drinking enough water your kidneys look dry. Recheck in 1 month.

## 2023-02-13 ENCOUNTER — Other Ambulatory Visit: Payer: Self-pay

## 2023-02-13 DIAGNOSIS — I10 Essential (primary) hypertension: Secondary | ICD-10-CM

## 2023-02-13 MED ORDER — AMLODIPINE BESYLATE 5 MG PO TABS: 5.0000 mg | ORAL_TABLET | Freq: Every day | ORAL | 1 refills | Status: DC

## 2023-02-19 ENCOUNTER — Other Ambulatory Visit: Payer: Self-pay | Admitting: Physician Assistant

## 2023-02-19 DIAGNOSIS — F419 Anxiety disorder, unspecified: Secondary | ICD-10-CM

## 2023-02-27 ENCOUNTER — Other Ambulatory Visit: Payer: Self-pay | Admitting: Physician Assistant

## 2023-04-02 ENCOUNTER — Other Ambulatory Visit: Payer: Self-pay | Admitting: Physician Assistant

## 2023-04-02 DIAGNOSIS — F32A Depression, unspecified: Secondary | ICD-10-CM

## 2023-04-22 ENCOUNTER — Other Ambulatory Visit: Payer: Self-pay | Admitting: Physician Assistant

## 2023-04-22 DIAGNOSIS — I10 Essential (primary) hypertension: Secondary | ICD-10-CM

## 2023-05-08 ENCOUNTER — Other Ambulatory Visit: Payer: Self-pay | Admitting: Physician Assistant

## 2023-05-08 DIAGNOSIS — F419 Anxiety disorder, unspecified: Secondary | ICD-10-CM

## 2023-05-15 ENCOUNTER — Other Ambulatory Visit: Payer: Self-pay | Admitting: Physician Assistant

## 2023-05-15 DIAGNOSIS — F419 Anxiety disorder, unspecified: Secondary | ICD-10-CM

## 2023-05-15 DIAGNOSIS — R110 Nausea: Secondary | ICD-10-CM

## 2023-05-16 IMAGING — DX DG CERVICAL SPINE COMPLETE 4+V
6 series · 6 of 6 positions shown · non-contrast
Comparison: None.

CLINICAL DATA: Neck pain and right shoulder pain.  No injury.

EXAM:
CERVICAL SPINE - COMPLETE 4+ VIEW

[c-spine lat]
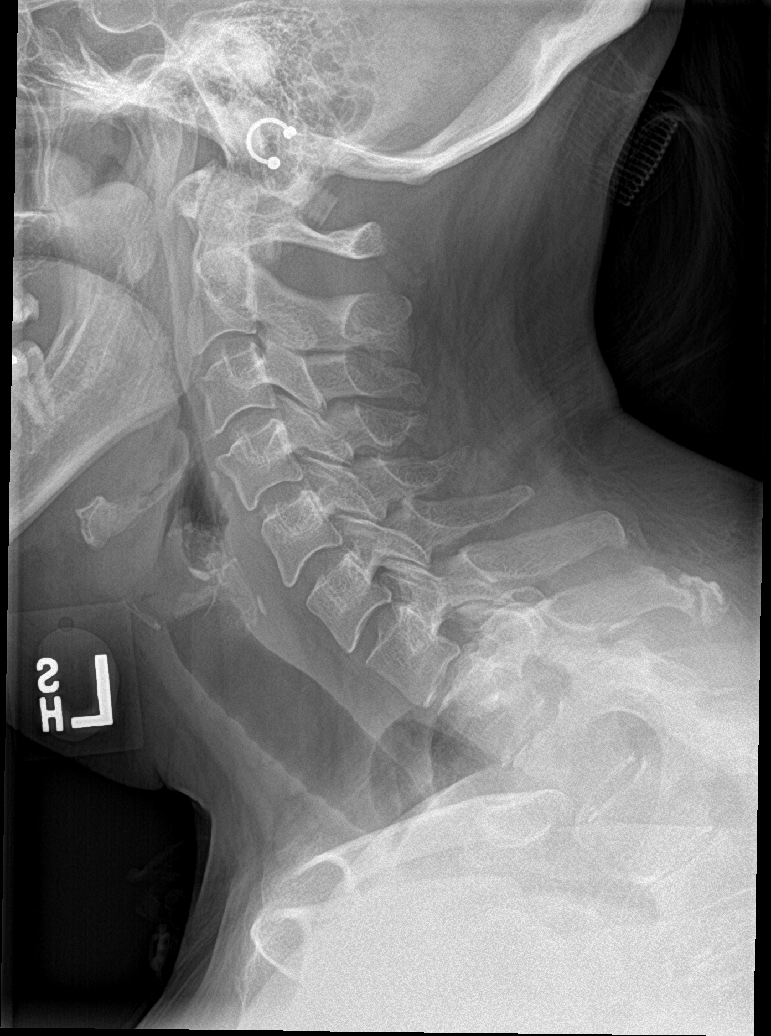

[c-spine obl (1 of 2)]
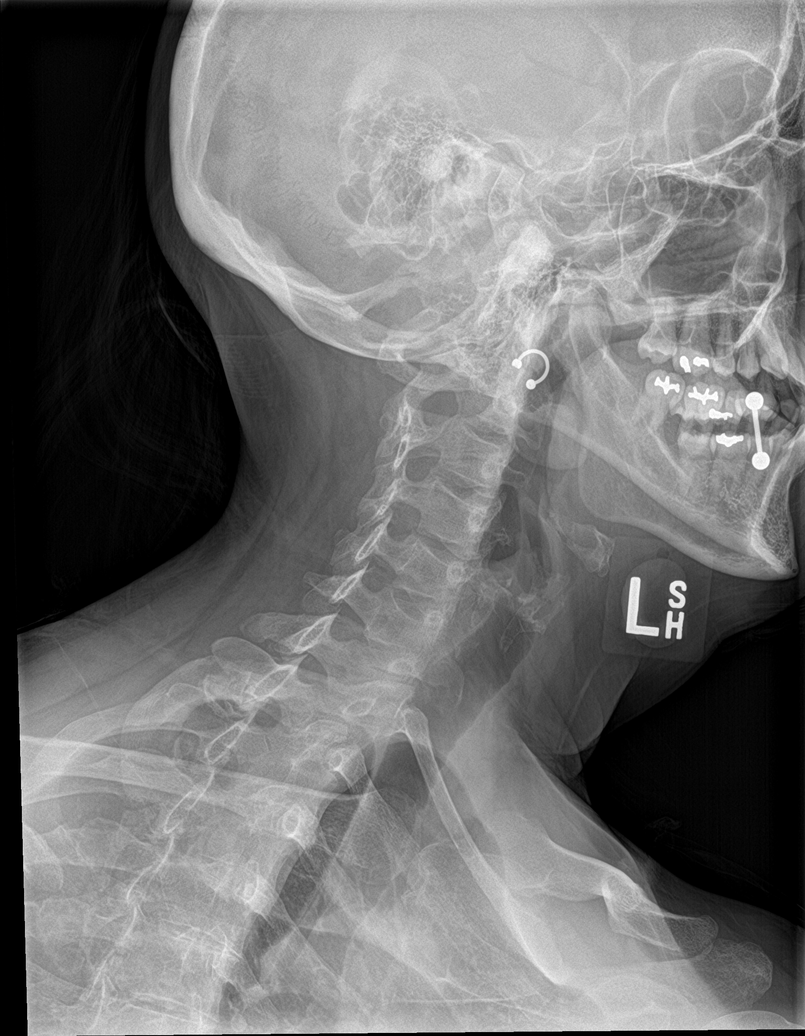

[c-spine obl (2 of 2)]
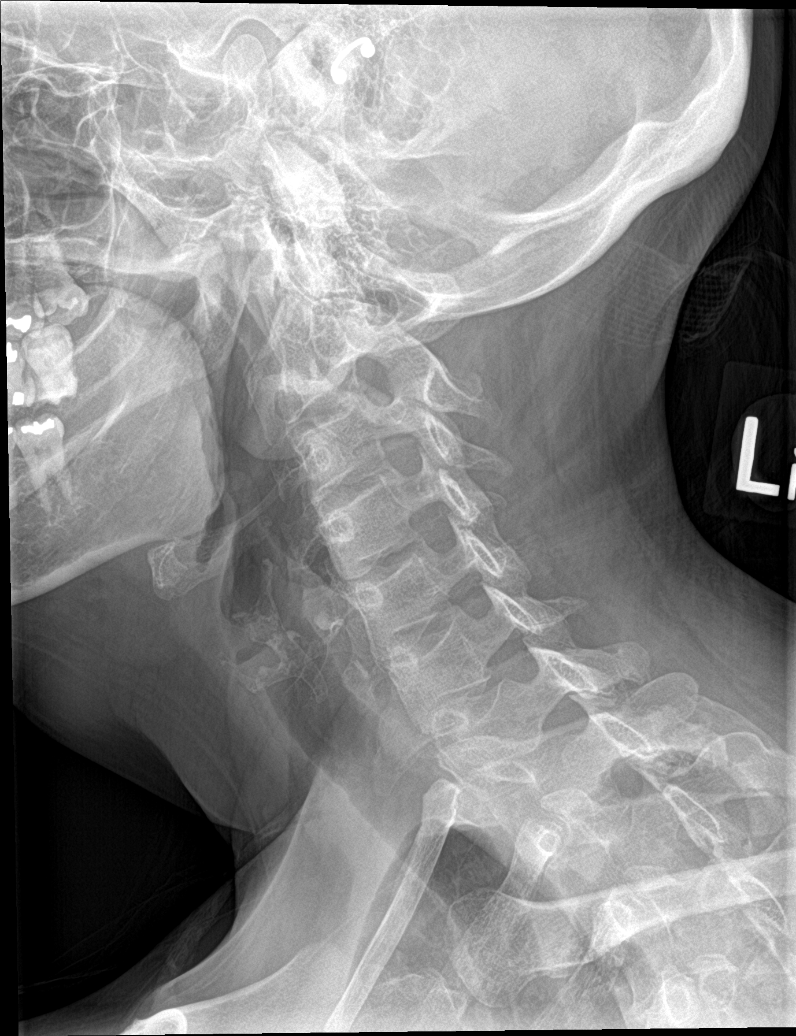

[c-spine ap]
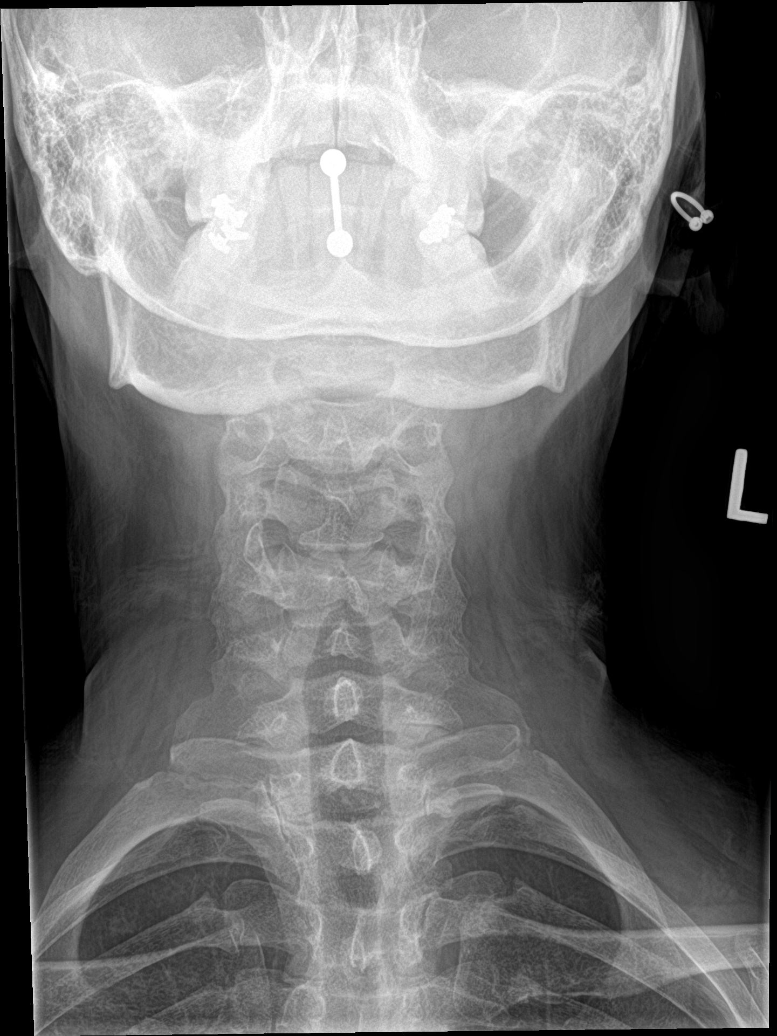

[c-spine open mouth]
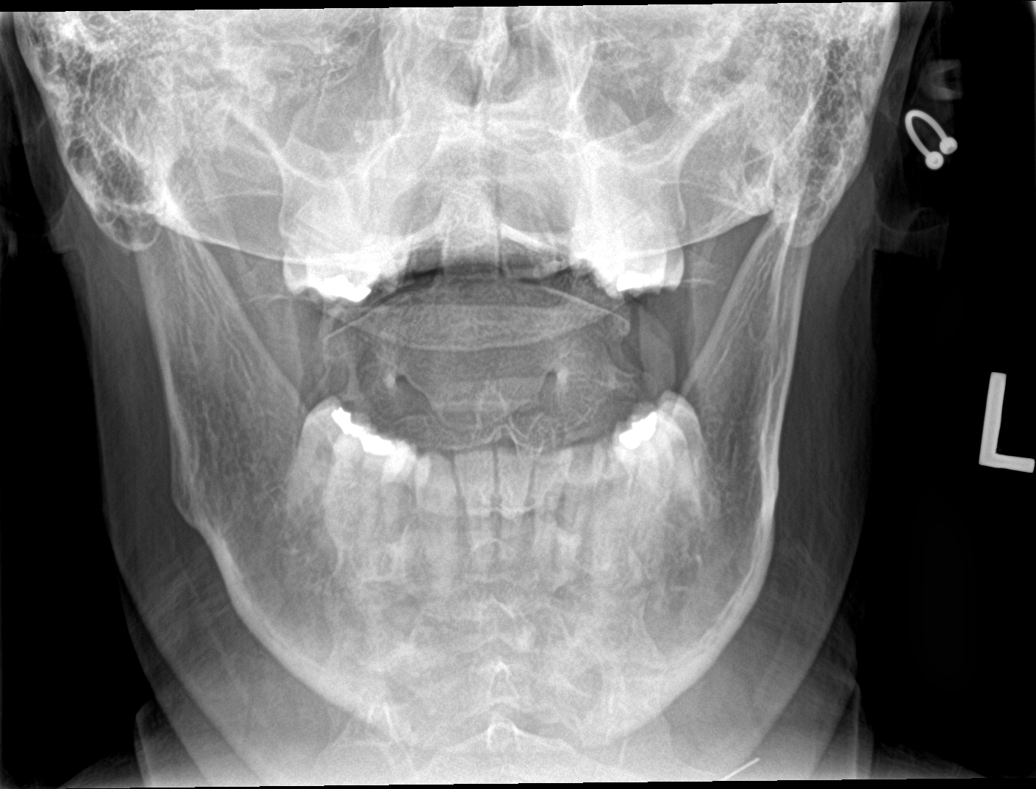

[[person_name]]
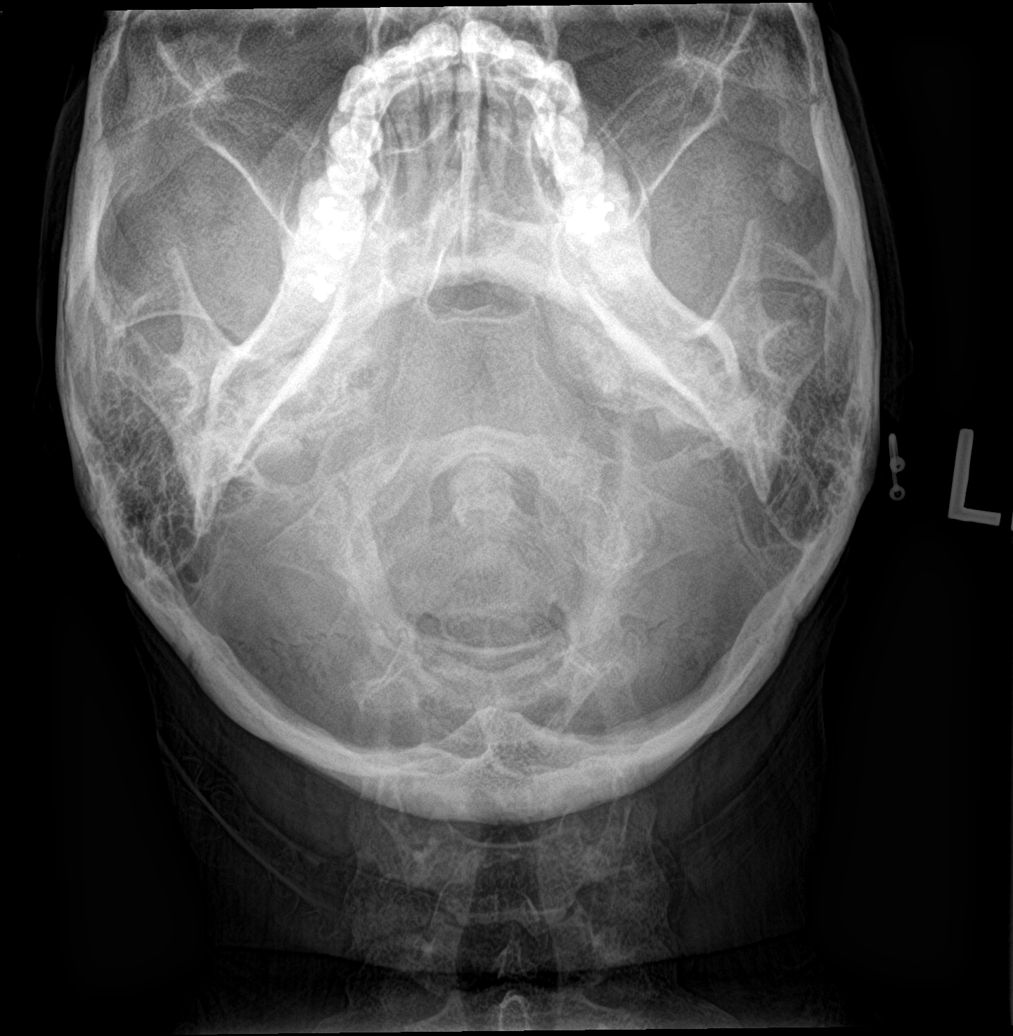

[6 of 6 positions shown; findings below may reference images not displayed]

FINDINGS: There is no evidence of cervical spine fracture or prevertebral soft
tissue swelling. Alignment is normal. No other significant bone
abnormalities are identified.
IMPRESSION: Negative cervical spine radiographs.

## 2023-05-28 ENCOUNTER — Other Ambulatory Visit: Payer: Self-pay | Admitting: Physician Assistant

## 2023-05-28 DIAGNOSIS — G43009 Migraine without aura, not intractable, without status migrainosus: Secondary | ICD-10-CM

## 2023-05-28 DIAGNOSIS — G43011 Migraine without aura, intractable, with status migrainosus: Secondary | ICD-10-CM

## 2023-06-09 ENCOUNTER — Other Ambulatory Visit: Payer: Self-pay | Admitting: Physician Assistant

## 2023-06-09 DIAGNOSIS — F419 Anxiety disorder, unspecified: Secondary | ICD-10-CM

## 2023-06-17 ENCOUNTER — Ambulatory Visit: Payer: 59 | Admitting: Physician Assistant

## 2023-06-18 ENCOUNTER — Ambulatory Visit: Payer: 59 | Admitting: Physician Assistant

## 2023-06-18 ENCOUNTER — Encounter: Payer: Self-pay | Admitting: Physician Assistant

## 2023-06-18 VITALS — BP 120/86 | HR 76 | Ht 66.0 in | Wt 228.0 lb

## 2023-06-18 DIAGNOSIS — Z23 Encounter for immunization: Secondary | ICD-10-CM | POA: Diagnosis not present

## 2023-06-18 DIAGNOSIS — K219 Gastro-esophageal reflux disease without esophagitis: Secondary | ICD-10-CM | POA: Diagnosis not present

## 2023-06-18 DIAGNOSIS — Z131 Encounter for screening for diabetes mellitus: Secondary | ICD-10-CM

## 2023-06-18 DIAGNOSIS — Z0000 Encounter for general adult medical examination without abnormal findings: Secondary | ICD-10-CM

## 2023-06-18 DIAGNOSIS — I10 Essential (primary) hypertension: Secondary | ICD-10-CM

## 2023-06-18 DIAGNOSIS — Z1329 Encounter for screening for other suspected endocrine disorder: Secondary | ICD-10-CM

## 2023-06-18 DIAGNOSIS — Z13220 Encounter for screening for lipoid disorders: Secondary | ICD-10-CM

## 2023-06-18 DIAGNOSIS — F419 Anxiety disorder, unspecified: Secondary | ICD-10-CM | POA: Diagnosis not present

## 2023-06-18 DIAGNOSIS — F32A Depression, unspecified: Secondary | ICD-10-CM

## 2023-06-18 MED ORDER — VORTIOXETINE HBR 20 MG PO TABS: 20.0000 mg | ORAL_TABLET | Freq: Every day | ORAL | 11 refills | Status: DC

## 2023-06-18 MED ORDER — PANTOPRAZOLE SODIUM 40 MG PO TBEC: 40.0000 mg | DELAYED_RELEASE_TABLET | Freq: Two times a day (BID) | ORAL | 3 refills | Status: DC

## 2023-06-18 MED ORDER — BUSPIRONE HCL 30 MG PO TABS: 30.0000 mg | ORAL_TABLET | Freq: Two times a day (BID) | ORAL | 3 refills | Status: DC

## 2023-06-18 MED ORDER — AMLODIPINE BESYLATE 5 MG PO TABS
5.0000 mg | ORAL_TABLET | Freq: Every day | ORAL | 0 refills | Status: DC
Start: 2023-06-18 — End: 2023-11-01

## 2023-06-18 MED ORDER — ALPRAZOLAM 1 MG PO TABS: 1.0000 mg | ORAL_TABLET | Freq: Two times a day (BID) | ORAL | 1 refills | Status: DC | PRN

## 2023-06-18 NOTE — Progress Notes (Signed)
Established Patient Office Visit  Subjective   Patient ID: Denise Craig, female    DOB: 02-04-1971  Age: 52 y.o. MRN: [id number]  No chief complaint on file.   HPI Pt is a 52 yo obese female with HTN, GERD, GAD, MDD, IDA, pre-diabetes, CKD who presents to the clinic for follow up and refills.   Pt is doing well overall. No concerns with mood. She denies any CP, palpitations,vision changes.   She has had some stress induced migraines with her husband battling cancer.   Active Ambulatory Problems    Diagnosis Date Noted   THYROMEGALY 12/11/2007   Class 1 obesity due to excess calories without serious comorbidity with body mass index (BMI) of 34.0 to 34.9 in adult 06/11/2006   Iron deficiency anemia 05/16/2007   ANEMIA, VITAMIN B12 DEFICIENCY 06/04/2007   Anxiety state 06/11/2006   Migraine headache 06/11/2006   ASTHMA, PERSISTENT, MODERATE 05/16/2007   HIATAL HERNIA 12/22/2007   IBS 06/16/2009   ENDOMETRIOSIS 06/11/2006   INSOMNIA 07/15/2008   Essential hypertension 02/27/2011   Gastroesophageal reflux disease 09/17/2012   Nephrolithiasis 07/14/2013   Periumbilical abdominal pain 01/25/2016   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   Anxiety and depression 06/01/2016   Hot flashes 09/21/2016   Family history of breast cancer 09/21/2016   Menopause 09/24/2016   Vitamin D deficiency 09/24/2016   Poor concentration 01/21/2018   Moderate episode of recurrent major depressive disorder (HCC) 09/21/2020   Pre-diabetes 10/13/2021   Intractable migraine without aura and with status migrainosus 01/10/2022   Sinus headache 01/15/2022   TIA (transient ischemic attack) 04/10/2022   CKD stage G4/A3, 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) 04/10/2022   Arthralgia of right knee 08/23/2020   Hypotension 04/17/2022   Abnormal finding on diagnostic imaging of right kidney 05/04/2022   High risk for colon cancer 12/14/2022   Caregiver stress 12/15/2022   Resolved  Ambulatory Problems    Diagnosis Date Noted   Leg edema 01/02/2011   At risk for impaired digestion 09/21/2016   Low iron stores 09/24/2016   Fidgeting 01/21/2018   Chest tightness 01/21/2018   No energy 01/21/2018   Nausea 10/13/2021   Past Medical History:  Diagnosis Date   Anxiety    B12 deficiency    Depression    GERD (gastroesophageal reflux disease)    Infertility, female    Iron deficiency    Migraine    PONV (postoperative nausea and vomiting)    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    Objective:     BP 120/86   Pulse 76   Ht 5\' 6"  (1.676 m)   Wt 228 lb (103.4 kg)   SpO2 97%   BMI 36.80 kg/m  BP Readings from Last 3 Encounters:  06/18/23 120/86  12/14/22 110/65  08/28/22 137/71   Wt Readings from Last 3 Encounters:  06/18/23 228 lb (103.4 kg)  12/14/22 214 lb (97.1 kg)  08/28/22 213 lb (96.6 kg)    .    06/18/2023    1:54 PM 12/14/2022   10:29 AM 08/28/2022    9:13 AM 07/16/2022   10:39 AM 05/04/2022    1:46 PM  Depression screen PHQ 2/9  Decreased Interest 1 2 1 1 1   Down, Depressed, Hopeless 1 1 0 0 1  PHQ - 2 Score 2 3 1 1 2   Altered sleeping 2 2 2  2   Tired, decreased  energy 2 1 2  1   Change in appetite 1 1 1   0  Feeling bad or failure about yourself  0 1 0  0  Trouble concentrating 0 1 1  1   Moving slowly or fidgety/restless 0 0 0  0  Suicidal thoughts 0 0 0  0  PHQ-9 Score 7 9 7  6   Difficult doing work/chores Not difficult at all Somewhat difficult Somewhat difficult  Not difficult at all     Physical Exam Constitutional:      Appearance: Normal appearance. She is obese.  HENT:     Head: Normocephalic.  Cardiovascular:     Rate and Rhythm: Normal rate and regular rhythm.     Pulses: Normal pulses.     Heart sounds: Normal heart sounds.  Pulmonary:     Effort: Pulmonary effort is normal.     Breath sounds: Normal breath sounds.  Musculoskeletal:     Right lower leg: No edema.     Left lower  leg: No edema.  Neurological:     General: No focal deficit present.     Mental Status: She is alert and oriented to person, place, and time.  Psychiatric:        Mood and Affect: Mood normal.          Assessment & Plan:  Marland KitchenMarland KitchenDiagnoses and all orders for this visit:  Preventative health care -     Lipid panel -     CMP14+EGFR -     TSH + free T4 -     Hemoglobin A1c  Essential hypertension -     amLODipine (NORVASC) 5 MG tablet; Take 1 tablet (5 mg total) by mouth daily.  Anxiety and depression -     vortioxetine HBr (TRINTELLIX) 20 MG TABS tablet; Take 1 tablet (20 mg total) by mouth daily.  Anxiety -     ALPRAZolam (XANAX) 1 MG tablet; Take 1 tablet (1 mg total) by mouth 2 (two) times daily as needed for anxiety. -     busPIRone (BUSPAR) 30 MG tablet; Take 1 tablet (30 mg total) by mouth 2 (two) times daily. TAKE 1 TABLET BY MOUTH IN THE  MORNING AND 1 TABLET AT BEDTIME  Screening for lipid disorders -     Lipid panel  Screening for diabetes mellitus -     CMP14+EGFR -     Hemoglobin A1c  Thyroid disorder screen -     TSH + free T4  Gastroesophageal reflux disease, unspecified whether esophagitis present -     pantoprazole (PROTONIX) 40 MG tablet; Take 1 tablet (40 mg total) by mouth 2 (two) times daily.  Immunization due -     Flu vaccine trivalent PF, 6mos and older(Flulaval,Afluria,Fluarix,Fluzone)   Vitals look great PHQ no concerns Refilled medications Fasting labs ordered Flu shot given today   Return in about 6 months (around 12/17/2023).    Tandy Gaw, PA-C

## 2023-06-18 NOTE — Patient Instructions (Signed)
Fasting labs ordered

## 2023-06-24 ENCOUNTER — Encounter: Payer: Self-pay | Admitting: Physician Assistant

## 2023-06-25 ENCOUNTER — Other Ambulatory Visit: Payer: Self-pay | Admitting: Physician Assistant

## 2023-06-25 LAB — CMP14+EGFR
ALT: 25 [IU]/L (ref 0–32)
AST: 23 [IU]/L (ref 0–40)
Albumin: 3.9 g/dL (ref 3.8–4.9)
Alkaline Phosphatase: 80 [IU]/L (ref 44–121)
BUN/Creatinine Ratio: 16 (ref 9–23)
BUN: 19 mg/dL (ref 6–24)
Bilirubin Total: 0.2 mg/dL (ref 0.0–1.2)
CO2: 22 mmol/L (ref 20–29)
Calcium: 8.8 mg/dL (ref 8.7–10.2)
Chloride: 103 mmol/L (ref 96–106)
Creatinine, Ser: 1.21 mg/dL — ABNORMAL HIGH (ref 0.57–1.00)
Globulin, Total: 2 g/dL (ref 1.5–4.5)
Glucose: 83 mg/dL (ref 70–99)
Potassium: 4.3 mmol/L (ref 3.5–5.2)
Sodium: 142 mmol/L (ref 134–144)
Total Protein: 5.9 g/dL — ABNORMAL LOW (ref 6.0–8.5)
eGFR: 54 mL/min/{1.73_m2} — ABNORMAL LOW (ref >59)

## 2023-06-25 LAB — TSH+FREE T4
Free T4: 1.18 ng/dL (ref 0.82–1.77)
TSH: 2.88 u[IU]/mL (ref 0.450–4.500)

## 2023-06-25 LAB — LIPID PANEL
Chol/HDL Ratio: 2.6 {ratio} (ref 0.0–4.4)
Cholesterol, Total: 214 mg/dL — ABNORMAL HIGH (ref 100–199)
HDL: 81 mg/dL (ref >39)
LDL Chol Calc (NIH): 120 mg/dL — ABNORMAL HIGH (ref 0–99)
Triglycerides: 73 mg/dL (ref 0–149)
VLDL Cholesterol Cal: 13 mg/dL (ref 5–40)

## 2023-06-25 LAB — HEMOGLOBIN A1C
Est. average glucose Bld gHb Est-mCnc: 108 mg/dL
Hgb A1c MFr Bld: 5.4 % (ref 4.8–5.6)

## 2023-06-25 NOTE — Progress Notes (Signed)
Denise Craig,   Your kidney function improved a lot! Way to go!  Protein just a little low. Make sure you are eating healthy meals with protein.  Thyroid looks great.  A1C is normal. No diabetes!  HDL, good cholesterol, looks great.  LDL, bad cholesterol, is good for your risk factors.   10 year risk is low. Keep up to the good work!  Marland KitchenMarland KitchenThe 10-year ASCVD risk score (Arnett DK, et al., 2019) is: 1.2%   Values used to calculate the score:     Age: 52 years     Sex: Female     Is Non-Hispanic African American: No     Diabetic: No     Tobacco smoker: No     Systolic Blood Pressure: 120 mmHg     Is BP treated: Yes     HDL Cholesterol: 81 mg/dL     Total Cholesterol: 214 mg/dL

## 2023-06-28 ENCOUNTER — Other Ambulatory Visit: Payer: Self-pay | Admitting: Physician Assistant

## 2023-06-28 DIAGNOSIS — I10 Essential (primary) hypertension: Secondary | ICD-10-CM

## 2023-10-17 LAB — HM COLONOSCOPY

## 2023-10-27 ENCOUNTER — Other Ambulatory Visit: Payer: Self-pay | Admitting: Physician Assistant

## 2023-10-29 ENCOUNTER — Other Ambulatory Visit: Payer: Self-pay | Admitting: Physician Assistant

## 2023-10-29 DIAGNOSIS — I10 Essential (primary) hypertension: Secondary | ICD-10-CM

## 2023-10-31 ENCOUNTER — Other Ambulatory Visit: Payer: Self-pay | Admitting: Physician Assistant

## 2023-10-31 DIAGNOSIS — R110 Nausea: Secondary | ICD-10-CM

## 2023-11-25 ENCOUNTER — Other Ambulatory Visit: Payer: Self-pay | Admitting: Physician Assistant

## 2023-11-25 DIAGNOSIS — G43011 Migraine without aura, intractable, with status migrainosus: Secondary | ICD-10-CM

## 2023-11-25 DIAGNOSIS — G43009 Migraine without aura, not intractable, without status migrainosus: Secondary | ICD-10-CM

## 2023-12-03 ENCOUNTER — Encounter: Payer: Self-pay | Admitting: Physician Assistant

## 2023-12-03 ENCOUNTER — Ambulatory Visit: Admitting: Physician Assistant

## 2023-12-03 VITALS — BP 132/78 | HR 99 | Ht 66.0 in | Wt 228.0 lb

## 2023-12-03 DIAGNOSIS — R110 Nausea: Secondary | ICD-10-CM

## 2023-12-03 DIAGNOSIS — G43009 Migraine without aura, not intractable, without status migrainosus: Secondary | ICD-10-CM

## 2023-12-03 DIAGNOSIS — K219 Gastro-esophageal reflux disease without esophagitis: Secondary | ICD-10-CM

## 2023-12-03 DIAGNOSIS — E6609 Other obesity due to excess calories: Secondary | ICD-10-CM

## 2023-12-03 DIAGNOSIS — J302 Other seasonal allergic rhinitis: Secondary | ICD-10-CM

## 2023-12-03 DIAGNOSIS — G43011 Migraine without aura, intractable, with status migrainosus: Secondary | ICD-10-CM

## 2023-12-03 DIAGNOSIS — F419 Anxiety disorder, unspecified: Secondary | ICD-10-CM | POA: Diagnosis not present

## 2023-12-03 DIAGNOSIS — E66812 Obesity, class 2: Secondary | ICD-10-CM

## 2023-12-03 DIAGNOSIS — F32A Depression, unspecified: Secondary | ICD-10-CM

## 2023-12-03 DIAGNOSIS — Z6836 Body mass index (BMI) 36.0-36.9, adult: Secondary | ICD-10-CM

## 2023-12-03 DIAGNOSIS — I10 Essential (primary) hypertension: Secondary | ICD-10-CM

## 2023-12-03 MED ORDER — PANTOPRAZOLE SODIUM 40 MG PO TBEC: 40.0000 mg | DELAYED_RELEASE_TABLET | Freq: Two times a day (BID) | ORAL | 3 refills | Status: AC

## 2023-12-03 MED ORDER — ALPRAZOLAM 1 MG PO TABS: 1.0000 mg | ORAL_TABLET | Freq: Two times a day (BID) | ORAL | 1 refills | Status: DC | PRN

## 2023-12-03 MED ORDER — BUSPIRONE HCL 30 MG PO TABS: 30.0000 mg | ORAL_TABLET | Freq: Two times a day (BID) | ORAL | 3 refills | Status: AC

## 2023-12-03 MED ORDER — VORTIOXETINE HBR 20 MG PO TABS: 20.0000 mg | ORAL_TABLET | Freq: Every day | ORAL | 11 refills | Status: DC

## 2023-12-03 MED ORDER — RIZATRIPTAN BENZOATE 10 MG PO TABS: 10.0000 mg | ORAL_TABLET | ORAL | 5 refills | Status: DC | PRN

## 2023-12-03 MED ORDER — MONTELUKAST SODIUM 10 MG PO TABS: 10.0000 mg | ORAL_TABLET | Freq: Every day | ORAL | 3 refills | Status: AC

## 2023-12-03 MED ORDER — ZOLMITRIPTAN 5 MG PO TBDP: 5.0000 mg | ORAL_TABLET | ORAL | 2 refills | Status: AC | PRN

## 2023-12-03 MED ORDER — ONDANSETRON 8 MG PO TBDP: ORAL_TABLET | ORAL | 5 refills | Status: DC

## 2023-12-03 NOTE — Progress Notes (Unsigned)
 Established Patient Office Visit  Subjective   Patient ID: Denise Craig, female    DOB: 1970-09-08  Age: 53 y.o. MRN: [id number]  No chief complaint on file.   HPI Pt is a 53 yo female with HTN, Migraines, GERD, GAD, MDD who presents for follow up and medication refills.   Pt is doing well. No concerns. Aimovig is controlling migraines and she has rescue that also works. Her mood is good. Husband is still battling cancer but doing well. Job is going well. Denies any CP, palpitations, headaches or vision changes.   She would like to lose weight. She tried wegovy and had a lot of nausea. She would like to try zepbound. She is walking more and trying to keep to healthy diet.    Active Ambulatory Problems    Diagnosis Date Noted   THYROMEGALY 12/11/2007   Class 1 obesity due to excess calories without serious comorbidity with body mass index (BMI) of 34.0 to 34.9 in adult 06/11/2006   Iron deficiency anemia 05/16/2007   ANEMIA, VITAMIN B12 DEFICIENCY 06/04/2007   Anxiety state 06/11/2006   Migraine headache 06/11/2006   ASTHMA, PERSISTENT, MODERATE 05/16/2007   HIATAL HERNIA 12/22/2007   IBS 06/16/2009   ENDOMETRIOSIS 06/11/2006   INSOMNIA 07/15/2008   Essential hypertension 02/27/2011   Gastroesophageal reflux disease 09/17/2012   Nephrolithiasis 07/14/2013   Periumbilical abdominal pain 01/25/2016   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   Anxiety and depression 06/01/2016   Hot flashes 09/21/2016   Family history of breast cancer 09/21/2016   Menopause 09/24/2016   Vitamin D deficiency 09/24/2016   Poor concentration 01/21/2018   Moderate episode of recurrent major depressive disorder (HCC) 09/21/2020   Pre-diabetes 10/13/2021   Intractable migraine without aura and with status migrainosus 01/10/2022   Sinus headache 01/15/2022   TIA (transient ischemic attack) 04/10/2022   CKD stage G4/A3, 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) 04/10/2022    Arthralgia of right knee 08/23/2020   Hypotension 04/17/2022   Abnormal finding on diagnostic imaging of right kidney 05/04/2022   High risk for colon cancer 12/14/2022   Caregiver stress 12/15/2022   Class 2 obesity due to excess calories without serious comorbidity with body mass index (BMI) of 36.0 to 36.9 in adult 12/06/2023   Seasonal allergies 12/06/2023   Resolved Ambulatory Problems    Diagnosis Date Noted   Leg edema 01/02/2011   At risk for impaired digestion 09/21/2016   Low iron stores 09/24/2016   Fidgeting 01/21/2018   Chest tightness 01/21/2018   No energy 01/21/2018   Nausea 10/13/2021   Past Medical History:  Diagnosis Date   Anxiety    B12 deficiency    Depression    GERD (gastroesophageal reflux disease)    Infertility, female    Iron deficiency    Migraine    PONV (postoperative nausea and vomiting)    Status post gastrectomy 05/12/12   Uterine polyp     ROS See HPI.    Objective:     BP 132/78 (BP Location: Left Arm, Patient Position: Sitting, Cuff Size: Large)   Pulse 99   Ht 5\' 6"  (1.676 m)   Wt 228 lb (103.4 kg)   SpO2 99%   BMI 36.80 kg/m  BP Readings from Last 3 Encounters:  12/03/23 132/78  06/18/23 120/86  12/14/22 110/65   Wt Readings from Last 3 Encounters:  12/03/23 228 lb (103.4 kg)  06/18/23 228 lb (103.4 kg)  12/14/22 214 lb (  97.1 kg)   ..    12/06/2023    9:44 AM 06/18/2023    1:54 PM 12/14/2022   10:29 AM 08/28/2022    9:13 AM 07/16/2022   10:39 AM  Depression screen PHQ 2/9  Decreased Interest 1 1 2 1 1   Down, Depressed, Hopeless 1 1 1  0 0  PHQ - 2 Score 2 2 3 1 1   Altered sleeping 1 2 2 2    Tired, decreased energy 1 2 1 2    Change in appetite 0 1 1 1    Feeling bad or failure about yourself  0 0 1 0   Trouble concentrating 0 0 1 1   Moving slowly or fidgety/restless 0 0 0 0   Suicidal thoughts 0 0 0 0   PHQ-9 Score 4 7 9 7    Difficult doing work/chores Not difficult at all Not difficult at all Somewhat  difficult Somewhat difficult    ..    12/06/2023    9:44 AM 06/18/2023    1:54 PM 12/14/2022   10:29 AM 08/28/2022    9:13 AM  GAD 7 : Generalized Anxiety Score  Nervous, Anxious, on Edge 1 1 1 1   Control/stop worrying 1 1 1 1   Worry too much - different things 1 1 2  0  Trouble relaxing 0 0 1 1  Restless 0 0 1 0  Easily annoyed or irritable 0 1 2 1   Afraid - awful might happen 0 1 0 0  Total GAD 7 Score 3 5 8 4   Anxiety Difficulty Not difficult at all Not difficult at all Somewhat difficult Somewhat difficult        Physical Exam Constitutional:      Appearance: Normal appearance. She is obese.  HENT:     Head: Normocephalic.  Cardiovascular:     Rate and Rhythm: Normal rate and regular rhythm.  Pulmonary:     Effort: Pulmonary effort is normal.     Breath sounds: Normal breath sounds.  Musculoskeletal:     Right lower leg: No edema.     Left lower leg: No edema.  Neurological:     General: No focal deficit present.     Mental Status: She is alert and oriented to person, place, and time.  Psychiatric:        Mood and Affect: Mood normal.      The 10-year ASCVD risk score (Arnett DK, et al., 2019) is: 1.5%    Assessment & Plan:  Marland KitchenMarland KitchenDiagnoses and all orders for this visit:  Gastroesophageal reflux disease, unspecified whether esophagitis present -     pantoprazole (PROTONIX) 40 MG tablet; Take 1 tablet (40 mg total) by mouth 2 (two) times daily.  Anxiety -     busPIRone (BUSPAR) 30 MG tablet; Take 1 tablet (30 mg total) by mouth 2 (two) times daily. TAKE 1 TABLET BY MOUTH IN THE  MORNING AND 1 TABLET AT BEDTIME -     Discontinue: ALPRAZolam (XANAX) 1 MG tablet; Take 1 tablet (1 mg total) by mouth 2 (two) times daily as needed for anxiety. -     ALPRAZolam (XANAX) 1 MG tablet; Take 1 tablet (1 mg total) by mouth 2 (two) times daily as needed for anxiety.  Anxiety and depression -     vortioxetine HBr (TRINTELLIX) 20 MG TABS tablet; Take 1 tablet (20 mg total) by  mouth daily.  Migraine without aura and without status migrainosus, not intractable -     rizatriptan (MAXALT) 10 MG tablet;  Take 1 tablet (10 mg total) by mouth as needed for migraine. May repeat in 2 hours if needed -     zolmitriptan (ZOMIG ZMT) 5 MG disintegrating tablet; Take 1 tablet (5 mg total) by mouth as needed for migraine.  Intractable migraine without aura and with status migrainosus  Nausea -     ondansetron (ZOFRAN-ODT) 8 MG disintegrating tablet; DISSOLVE 1 TABLET ON THE TONGUE  EVERY 8 HOURS AS NEEDED FOR  NAUSEA AND VOMITING  Seasonal allergies -     montelukast (SINGULAIR) 10 MG tablet; Take 1 tablet (10 mg total) by mouth at bedtime.  Essential hypertension  Class 2 obesity due to excess calories without serious comorbidity with body mass index (BMI) of 36.0 to 36.9 in adult -     tirzepatide (ZEPBOUND) 2.5 MG/0.5ML Pen; Inject 2.5 mg into the skin once a week.   Vitals look good PHQ/GAD to goal Migraines controlled Medications refilled as needed Follow up in 6 months  .Marland KitchenDiscussed low carb diet with 1500 calories and 80g of protein.  Exercising at least 150 minutes a week.  [employer] could be a Chief Technology Officer.  Failed wegovy Trial of zepbound Discussed side effects and titration up Follow up in 3 months       Tandy Gaw, PA-C

## 2023-12-04 ENCOUNTER — Encounter: Payer: Self-pay | Admitting: Physician Assistant

## 2023-12-04 DIAGNOSIS — E6609 Other obesity due to excess calories: Secondary | ICD-10-CM

## 2023-12-06 DIAGNOSIS — J302 Other seasonal allergic rhinitis: Secondary | ICD-10-CM | POA: Insufficient documentation

## 2023-12-06 DIAGNOSIS — E6609 Other obesity due to excess calories: Secondary | ICD-10-CM | POA: Insufficient documentation

## 2023-12-06 DIAGNOSIS — E66812 Obesity, class 2: Secondary | ICD-10-CM | POA: Insufficient documentation

## 2023-12-06 MED ORDER — ZEPBOUND 2.5 MG/0.5ML ~~LOC~~ SOAJ: 2.5000 mg | SUBCUTANEOUS | 0 refills | Status: DC

## 2023-12-09 ENCOUNTER — Telehealth: Payer: Self-pay

## 2023-12-09 ENCOUNTER — Other Ambulatory Visit: Payer: Self-pay

## 2023-12-09 ENCOUNTER — Other Ambulatory Visit (HOSPITAL_COMMUNITY): Payer: Self-pay

## 2023-12-09 MED ORDER — ZEPBOUND 2.5 MG/0.5ML ~~LOC~~ SOAJ: 2.5000 mg | SUBCUTANEOUS | 0 refills | Status: DC

## 2023-12-09 NOTE — Telephone Encounter (Signed)
 Pharmacy Patient Advocate Encounter   Received notification from Patient Pharmacy that prior authorization for Zepbound 2.5 is required/requested.   Insurance verification completed.   The patient is insured through [facility] .   Per test claim: PA required; PA submitted to above mentioned insurance via CoverMyMeds Key/confirmation #/EOC [id number] Status is pending

## 2023-12-09 NOTE — Telephone Encounter (Signed)
 Pharmacy Patient Advocate Encounter  Received notification from [facility] that Prior Authorization for Zepbound 2.5 has been APPROVED from 12/09/23 to 12/30/23. Unable to obtain price due to refill too soon rejection, last fill date 12/09/23 next available fill date4/28/25   PA #/Case ID/Reference #: [id number]

## 2023-12-17 ENCOUNTER — Ambulatory Visit: Payer: 59 | Admitting: Physician Assistant

## 2023-12-30 ENCOUNTER — Encounter: Payer: Self-pay | Admitting: Physician Assistant

## 2023-12-30 MED ORDER — ZEPBOUND 5 MG/0.5ML ~~LOC~~ SOAJ: 5.0000 mg | SUBCUTANEOUS | 0 refills | Status: DC

## 2024-01-20 ENCOUNTER — Encounter: Payer: Self-pay | Admitting: Physician Assistant

## 2024-01-20 MED ORDER — ZEPBOUND 7.5 MG/0.5ML ~~LOC~~ SOAJ: 7.5000 mg | SUBCUTANEOUS | 0 refills | Status: DC

## 2024-01-20 MED ORDER — RIZATRIPTAN BENZOATE 10 MG PO TBDP: 10.0000 mg | ORAL_TABLET | ORAL | 3 refills | Status: DC | PRN

## 2024-02-02 ENCOUNTER — Other Ambulatory Visit: Payer: Self-pay | Admitting: Physician Assistant

## 2024-02-06 ENCOUNTER — Other Ambulatory Visit (HOSPITAL_COMMUNITY): Payer: Self-pay

## 2024-02-06 NOTE — Telephone Encounter (Signed)
 PA approved through 06/09/24.

## 2024-02-25 ENCOUNTER — Ambulatory Visit: Admitting: Physician Assistant

## 2024-02-25 ENCOUNTER — Encounter: Payer: Self-pay | Admitting: Physician Assistant

## 2024-02-25 VITALS — BP 128/78 | HR 86 | Ht 66.0 in | Wt 212.0 lb

## 2024-02-25 DIAGNOSIS — K581 Irritable bowel syndrome with constipation: Secondary | ICD-10-CM | POA: Diagnosis not present

## 2024-02-25 DIAGNOSIS — K219 Gastro-esophageal reflux disease without esophagitis: Secondary | ICD-10-CM

## 2024-02-25 DIAGNOSIS — E6609 Other obesity due to excess calories: Secondary | ICD-10-CM

## 2024-02-25 DIAGNOSIS — I10 Essential (primary) hypertension: Secondary | ICD-10-CM | POA: Diagnosis not present

## 2024-02-25 DIAGNOSIS — E66811 Obesity, class 1: Secondary | ICD-10-CM | POA: Diagnosis not present

## 2024-02-25 DIAGNOSIS — Z6834 Body mass index (BMI) 34.0-34.9, adult: Secondary | ICD-10-CM

## 2024-02-25 DIAGNOSIS — E66812 Obesity, class 2: Secondary | ICD-10-CM

## 2024-02-25 MED ORDER — LINACLOTIDE 290 MCG PO CAPS: 290.0000 ug | ORAL_CAPSULE | Freq: Every day | ORAL | 1 refills | Status: DC

## 2024-02-25 MED ORDER — ZEPBOUND 12.5 MG/0.5ML ~~LOC~~ SOAJ: 12.5000 mg | SUBCUTANEOUS | 1 refills | Status: DC

## 2024-02-25 MED ORDER — ZEPBOUND 10 MG/0.5ML ~~LOC~~ SOAJ: 10.0000 mg | SUBCUTANEOUS | 0 refills | Status: DC

## 2024-02-25 NOTE — Patient Instructions (Addendum)
 Increase zepbound  to 10mg  for 4 weeks then 12.5mg  weekly  Linaclotide Capsules What is this medication? LINACLOTIDE (lin a KLOE tide) treats irritable bowel syndrome (IBS) with constipation. It may also treat chronic constipation. It works by softening the stool, making it easier to have a bowel movement. This medicine may be used for other purposes; ask your health care provider or pharmacist if you have questions. COMMON BRAND NAME(S): Linzess What should I tell my care team before I take this medication? They need to know if you have any of these conditions: Diarrhea History of blockage in your bowels History of stool (fecal) impaction An unusual or allergic reaction to linaclotide, other medications, foods, dyes, or preservatives Pregnant or trying to get pregnant Breast-feeding How should I use this medication? Take this medication by mouth with water. Take it as directed on the prescription label at the same time every day. Do not cut, crush or chew this medication. Swallow the capsules whole. Take it on an empty stomach, at least 30 minutes before a meal. If you have trouble swallowing, you may open the capsule and mix with applesauce or water. Keep taking it unless your care team tells you to stop. How to take with applesauce: Open the capsule and put the contents in 1 teaspoon of applesauce. Swallow the medication and applesauce right away. Do not chew the medication or applesauce. Do not store for later use. How to take with water: Pour 30 mL (1 ounce) of room temperature bottled water into a clean cup. Open the capsule and sprinkle all the beads into the cup of water. Swirl the mixture for at least 20 seconds. Drink the mixture right away. If beads remain in the cup, add 30 mL more of water and swirl. Drink the mixture right away. Do not store for later use. A special MedGuide will be given to you by the pharmacist with each prescription and refill. Be sure to read this information  carefully each time. Talk to your care team about the use of this medication in children. While it may be given to children as young as 6 years for selected conditions, precautions do apply. Overdosage: If you think you have taken too much of this medicine contact a poison control center or emergency room at once. NOTE: This medicine is only for you. Do not share this medicine with others. What if I miss a dose? If you miss a dose, skip it. Take your next dose at the normal time. Do not take extra or 2 doses at the same time to make up for the missed dose. What may interact with this medication? Interactions have not been studied. This list may not describe all possible interactions. Give your health care provider a list of all the medicines, herbs, non-prescription drugs, or dietary supplements you use. Also tell them if you smoke, drink alcohol, or use illegal drugs. Some items may interact with your medicine. What should I watch for while using this medication? Visit your care team for regular checks on your progress. Tell your care team if your symptoms do not start to get better or if they get worse. Diarrhea is a common side effect of this medication. It often begins within 2 weeks of starting this medication. Stop taking this medication and call your care team if you get severe diarrhea. Stop taking this medication and call your care team or go to the nearest hospital emergency room right away if you develop unusual or severe stomach pain, especially  if you also have bright red, bloody stools or black stools that look like tar. What side effects may I notice from receiving this medication? Side effects that you should report to your care team as soon as possible: Allergic reactions--skin rash, itching, hives, swelling of the face, lips, tongue, or throat Side effects that usually do not require medical attention (report to your care team if they continue or are  bothersome): Bloating Diarrhea Gas Stomach pain This list may not describe all possible side effects. Call your doctor for medical advice about side effects. You may report side effects to FDA at 1-800-FDA-1088. Where should I keep my medication? Keep out of the reach of children and pets. Store at room temperature between 20 and 25 degrees C (68 and 77 degrees F). Keep this medication in the original container. Protect from moisture. Keep the container tightly closed. Do not throw out the packet in the container. It keeps the medication dry. Get rid of any unused medication after the expiration date. To get rid of medications that are no longer needed or have expired: Take the medication to a medication take-back program. Check with your pharmacy or law enforcement to find a location. If you cannot return the medication, check the label or package insert to see if the medication should be thrown out in the garbage or flushed down the toilet. If you are not sure, ask your care team. If it is safe to put it in the trash, take the medication out of the container. Mix the medication with cat litter, dirt, coffee grounds, or other unwanted substance. Seal the mixture in a bag or container. Put it in the trash. NOTE: This sheet is a summary. It may not cover all possible information. If you have questions about this medicine, talk to your doctor, pharmacist, or health care provider.  2024 Elsevier/Gold Standard (2022-02-13 00:00:00)

## 2024-02-26 ENCOUNTER — Telehealth: Payer: Self-pay

## 2024-02-26 ENCOUNTER — Other Ambulatory Visit (HOSPITAL_COMMUNITY): Payer: Self-pay

## 2024-02-26 NOTE — Progress Notes (Signed)
   Established Patient Office Visit  Subjective   Patient ID: Denise Craig, female    DOB: November 07, 1970  Age: 53 y.o. MRN: [id number]  No chief complaint on file.   HPI Pt is a 53 yo obese female who presents to the clinic to follow up on zepbound . Pt has lost 16lbs since march. She is doing well and tolerating well. She is trying to increase activity. She certainly has less food noise and easier to make diet changes. She has found her constipation worsening. She has always had constipation with IBS-C. She usually can control with Doculax as needed and staying hydrated. She wonders if anything she can take. She has not had bowel movement in 4 days.      ROS See HPI.  Objective:     BP 128/78   Pulse 86   Ht 5' 6 (1.676 m)   Wt 212 lb (96.2 kg)   SpO2 99%   BMI 34.22 kg/m  BP Readings from Last 3 Encounters:  02/25/24 128/78  12/03/23 132/78  06/18/23 120/86   Wt Readings from Last 3 Encounters:  02/25/24 212 lb (96.2 kg)  12/03/23 228 lb (103.4 kg)  06/18/23 228 lb (103.4 kg)      Physical Exam Constitutional:      Appearance: Normal appearance.  HENT:     Head: Normocephalic.   Cardiovascular:     Rate and Rhythm: Normal rate and regular rhythm.  Pulmonary:     Effort: Pulmonary effort is normal.   Neurological:     Mental Status: She is alert and oriented to person, place, and time.   Psychiatric:        Mood and Affect: Mood normal.       The 10-year ASCVD risk score (Arnett DK, et al., 2019) is: 1.4%    Assessment & Plan:  SABRASABRADiagnoses and all orders for this visit:  Essential hypertension  Class 2 obesity due to excess calories without serious comorbidity with body mass index (BMI) of 36.0 to 36.9 in adult -     tirzepatide  (ZEPBOUND ) 10 MG/0.5ML Pen; Inject 10 mg into the skin once a week. -     tirzepatide  (ZEPBOUND ) 12.5 MG/0.5ML Pen; Inject 12.5 mg into the skin once a week.  Gastroesophageal reflux disease, unspecified whether esophagitis  present  Irritable bowel syndrome with constipation -     linaclotide (LINZESS) 290 MCG CAPS capsule; Take 1 capsule (290 mcg total) by mouth daily.  ' Pt is doing great with weight loss down 16lbs in 3 months Increased zepbound  to 10mg  for 4 weeks then 12.5mg  for 2 months Aim for 150 minutes of exercise a week Added linzess for chronic IBS constipation Discussed SE's Failed miralax/dulcolax/diet changes Encouraged patient to drink adequate water  Follow up in 3 months  Return in about 3 months (around 05/27/2024).    Juli Odom, PA-C

## 2024-02-26 NOTE — Telephone Encounter (Signed)
 Pharmacy Patient Advocate Encounter   Received notification from CoverMyMeds that prior authorization for Zepbound  10MG /0.5ML pen-injectors is required/requested.   Insurance verification completed.   The patient is insured through [facility] .   Per test claim: Refill too soon. PA is not needed at this time. Medication was filled 02/25/24. Next eligible fill date is 03/24/24.

## 2024-04-24 ENCOUNTER — Encounter: Payer: Self-pay | Admitting: Physician Assistant

## 2024-04-24 MED ORDER — NORTRIPTYLINE HCL 50 MG PO CAPS: 50.0000 mg | ORAL_CAPSULE | Freq: Every day | ORAL | 0 refills | Status: DC

## 2024-05-27 ENCOUNTER — Ambulatory Visit: Admitting: Physician Assistant

## 2024-05-27 VITALS — BP 111/76 | HR 92 | Temp 97.9°F | Ht 66.0 in | Wt 201.0 lb

## 2024-05-27 DIAGNOSIS — G43009 Migraine without aura, not intractable, without status migrainosus: Secondary | ICD-10-CM | POA: Diagnosis not present

## 2024-05-27 DIAGNOSIS — F419 Anxiety disorder, unspecified: Secondary | ICD-10-CM | POA: Diagnosis not present

## 2024-05-27 DIAGNOSIS — Z6832 Body mass index (BMI) 32.0-32.9, adult: Secondary | ICD-10-CM

## 2024-05-27 DIAGNOSIS — I10 Essential (primary) hypertension: Secondary | ICD-10-CM | POA: Diagnosis not present

## 2024-05-27 DIAGNOSIS — G43011 Migraine without aura, intractable, with status migrainosus: Secondary | ICD-10-CM

## 2024-05-27 DIAGNOSIS — E66811 Obesity, class 1: Secondary | ICD-10-CM

## 2024-05-27 DIAGNOSIS — Z7689 Persons encountering health services in other specified circumstances: Secondary | ICD-10-CM

## 2024-05-27 DIAGNOSIS — E6609 Other obesity due to excess calories: Secondary | ICD-10-CM

## 2024-05-27 MED ORDER — QULIPTA 60 MG PO TABS: ORAL_TABLET | ORAL | 0 refills | Status: DC

## 2024-05-27 MED ORDER — ZEPBOUND 10 MG/0.5ML ~~LOC~~ SOAJ: 10.0000 mg | SUBCUTANEOUS | 0 refills | Status: DC

## 2024-05-27 NOTE — Progress Notes (Signed)
 Established Patient Office Visit  Subjective   Patient ID: Denise Craig, female    DOB: 1971/06/08  Age: 53 y.o. MRN: [id number]  Chief Complaint  Patient presents with   Follow-up    HTN, Weight    HPI Pt is an obese female presenting for follow up on weight management. Total weight loss on Zepbound  is about 27 pounds. She is compliant on 12.5 mg but states she is experiencing nausea daily and is wanting to go back down to the 10 mg dose. She denies vomiting, new onset epigastric pain, or dizziness. Is not exercising as much as she would like to. States life is very stressful right now while working full time and caring for her husband going through chemo. Compliant on Buspar , Trintellix , and as needed Xanax .   Also PMH of migraines. Has had multiple migraine days(20 at least) in the past month despite Aimovig , and states triptan sometimes does not help for relief. Some days intensity is worse than others. Qulipta  helped the most but insurance denied it.    ROS See HPI.    Objective:     BP 111/76 (BP Location: Right Arm, Patient Position: Sitting, Cuff Size: Normal)   Pulse 92   Temp 97.9 F (36.6 C) (Oral)   Ht 5' 6 (1.676 m)   Wt 201 lb (91.2 kg)   SpO2 100%   BMI 32.44 kg/m  BP Readings from Last 3 Encounters:  05/27/24 111/76  02/25/24 128/78  12/03/23 132/78   Wt Readings from Last 3 Encounters:  05/27/24 201 lb (91.2 kg)  02/25/24 212 lb (96.2 kg)  12/03/23 228 lb (103.4 kg)    ..    12/06/2023    9:44 AM 06/18/2023    1:54 PM 12/14/2022   10:29 AM 08/28/2022    9:13 AM 07/16/2022   10:39 AM  Depression screen PHQ 2/9  Decreased Interest 1 1 2 1 1   Down, Depressed, Hopeless 1 1 1  0 0  PHQ - 2 Score 2 2 3 1 1   Altered sleeping 1 2 2 2    Tired, decreased energy 1 2 1 2    Change in appetite 0 1 1 1    Feeling bad or failure about yourself  0 0 1 0   Trouble concentrating 0 0 1 1   Moving slowly or fidgety/restless 0 0 0 0   Suicidal thoughts 0 0 0 0    PHQ-9 Score 4 7 9 7    Difficult doing work/chores Not difficult at all Not difficult at all Somewhat difficult Somewhat difficult    ..    12/06/2023    9:44 AM 06/18/2023    1:54 PM 12/14/2022   10:29 AM 08/28/2022    9:13 AM  GAD 7 : Generalized Anxiety Score  Nervous, Anxious, on Edge 1 1 1 1   Control/stop worrying 1 1 1 1   Worry too much - different things 1 1 2  0  Trouble relaxing 0 0 1 1  Restless 0 0 1 0  Easily annoyed or irritable 0 1 2 1   Afraid - awful might happen 0 1 0 0  Total GAD 7 Score 3 5 8 4   Anxiety Difficulty Not difficult at all Not difficult at all Somewhat difficult Somewhat difficult      Physical Exam Constitutional:      Appearance: Normal appearance. She is obese.  HENT:     Head: Normocephalic.  Cardiovascular:     Rate and Rhythm: Normal rate and regular  rhythm.  Pulmonary:     Effort: Pulmonary effort is normal.     Breath sounds: Normal breath sounds.  Neurological:     General: No focal deficit present.     Mental Status: She is alert and oriented to person, place, and time.  Psychiatric:        Mood and Affect: Mood normal.       The 10-year ASCVD risk score (Arnett DK, et al., 2019) is: 1.2%    Assessment & Plan:  SABRASABRAOprah was seen today for follow-up.  Diagnoses and all orders for this visit:  Encounter for weight management  Essential hypertension  Class 1 obesity due to excess calories without serious comorbidity with body mass index (BMI) of 32.0 to 32.9 in adult -     tirzepatide  (ZEPBOUND ) 10 MG/0.5ML Pen; Inject 10 mg into the skin once a week.  Migraine without aura and without status migrainosus, not intractable -     Atogepant  (QULIPTA ) 60 MG TABS; Take one tablet daily.  Intractable migraine without aura and with status migrainosus -     Atogepant  (QULIPTA ) 60 MG TABS; Take one tablet daily.  Anxiety   Xanax  refilled for as needed use No concerns Continue on buspar  and trintelix  Aimovig  not  controlling migraine days Will attempt to get qulipta  approved due to failure of aimovig  Continue zomig  for rescue  Down 27lbs today and 11lbs in last 3 months Not tolerating 12.5mg  very well and would like to go back down to 10mg  Sent 10mg  of zepbound  weekly BMI 32 Continue to work on regular exercise at least 150 minutes per week Continue to eat high protein diet Follow up in 3 months.    Return in about 3 months (around 08/26/2024).    Alyscia Carmon, PA-C

## 2024-05-29 ENCOUNTER — Encounter: Payer: Self-pay | Admitting: Physician Assistant

## 2024-06-01 ENCOUNTER — Other Ambulatory Visit (HOSPITAL_COMMUNITY): Payer: Self-pay

## 2024-06-01 ENCOUNTER — Telehealth: Payer: Self-pay

## 2024-06-01 DIAGNOSIS — Z7689 Persons encountering health services in other specified circumstances: Secondary | ICD-10-CM | POA: Insufficient documentation

## 2024-06-01 MED ORDER — ALPRAZOLAM 1 MG PO TABS: 1.0000 mg | ORAL_TABLET | Freq: Two times a day (BID) | ORAL | 1 refills | Status: AC | PRN

## 2024-06-01 NOTE — Telephone Encounter (Signed)
 Pharmacy Patient Advocate Encounter   Received notification from Patient Pharmacy that prior authorization for Qulipta  60mg  tabs is required/requested.   Insurance verification completed.   The patient is insured through [facility] .   Per test claim: PA required; PA submitted to above mentioned insurance via Latent Key/confirmation #/EOC [id number] Status is pending

## 2024-06-02 ENCOUNTER — Other Ambulatory Visit (HOSPITAL_COMMUNITY): Payer: Self-pay

## 2024-06-02 NOTE — Telephone Encounter (Signed)
 Pharmacy Patient Advocate Encounter  Received notification from [employer] that Prior Authorization for Qulipta  60mg  tabs has been APPROVED from 06/01/24 to 06/01/25. Ran test claim, Copay is $0. This test claim was processed through [facility]- copay amounts may vary at other pharmacies due to pharmacy/plan contracts, or as the patient moves through the different stages of their insurance plan.   PA #/Case ID/Reference #: [id number]  Left a message at [place] to notify of the approval.

## 2024-06-09 ENCOUNTER — Ambulatory Visit: Payer: Self-pay

## 2024-06-09 ENCOUNTER — Other Ambulatory Visit: Payer: Self-pay | Admitting: Physician Assistant

## 2024-06-09 MED ORDER — UBRELVY 100 MG PO TABS: ORAL_TABLET | ORAL | 5 refills | Status: AC

## 2024-06-09 NOTE — Telephone Encounter (Signed)
 Denise Craig newer to pharmacy for rescue.

## 2024-06-09 NOTE — Telephone Encounter (Signed)
 FYI Only or Action Required?: Action required by provider: update on patient condition and Rx request.  Patient was last seen in primary care on 05/27/2024 by Antoniette Vermell CROME, PA-C.  Called Nurse Triage reporting Headache and Medication Problem.  Symptoms began several days ago.  Interventions attempted: Prescription medications: Atogepant  (QULIPTA ) 60 MG TABS, rizatriptan  (MAXALT -MLT) 10 MG.  Symptoms are: unchanged.  Triage Disposition: See PCP When Office is Open (Within 3 Days)  Patient/caregiver understands and will follow disposition?: Yes           Copied from CRM #[id number]. Topic: Clinical - Medication Question >> Jun 09, 2024 11:29 AM Donna BRAVO wrote: Reason for CRM: patient has started new medication Atogepant  (QULIPTA ) 60 MG TABS now has bad headaches all the time, they go away if she take rizatriptan  (MAXALT -MLT) 10 MG disintegrating tablet and then headache comes back.   Patient would like to speak with a nurse regarding this issue   Informed patient she will receive a call back by end of business day. Reason for Disposition  [1] MILD-MODERATE headache AND [2] present > 3 days (72 hours) AND [3] no improvement after using Care Advice    Pt recently saw PCP and migraine medication management was modified. Pt reports was previously on INJ for migraines and transitioned to PO Rx, but unfortunately, she does not feel that PO medication has helped much since she transitioned  to PO on the same day she was due for INJ. Pt reports taking PRN migraine rx with relief, but H/A returns later that day.  Pt reports that [place] was discussed at last OV, and would like PCP to send in Rx. [facility] will forward encounter for [place], [place] 's office to review and advise. Patient verbalized understanding and is expecting call back from office for next steps, if Rx is sent.  Answer Assessment - Initial Assessment Questions 1. LOCATION: Where does it hurt?      L eye, L side of head 2.  ONSET: When did the headache start? (e.g., minutes, hours, days)      A few days 3. PATTERN: Does the pain come and go, or has it been constant since it started?     Comes and goes. Endorses taking rizatriptan  (MAXALT -MLT) 10 MG with relief but then it will come back at the end of day. 4. SEVERITY: How bad is the pain? and What does it keep you from doing?  (e.g., Scale 1-10; mild, moderate, or severe)     Light today 5. RECURRENT SYMPTOM: Have you ever had headaches before? If Yes, ask: When was the last time? and What happened that time?      Yes, has chronic H/A 6. CAUSE: What do you think is causing the headache?     Thinks it's r/t medication management/switching. 7. MIGRAINE: Have you been diagnosed with migraine headaches? If Yes, ask: Is this headache similar?      yes 8. HEAD INJURY: Has there been any recent injury to your head?      denies 9. OTHER SYMPTOMS: Do you have any other symptoms? (e.g., fever, stiff neck, eye pain, sore throat, cold symptoms)     Nausea. 10. PREGNANCY: Is there any chance you are pregnant? When was your last menstrual period?       N/a  Answer Assessment - Initial Assessment Questions 1. NAME of MEDICINE: What medicine(s) are you calling about?     Atogepant  (QULIPTA ) 60 MG TABS 2. QUESTION: What is your question? (e.g., double  dose of medicine, side effect)     Not providing relief yet, just started on 10/1. Was previously getting INJ (pt reports was due 10/1) and has concern that there was no bridge. 3. PRESCRIBER: Who prescribed the medicine? Reason: if prescribed by specialist, call should be referred to that group.     PCP 4. SYMPTOMS: Do you have any symptoms? If Yes, ask: What symptoms are you having?  How bad are the symptoms (e.g., mild, moderate, severe)     headache 5. PREGNANCY:  Is there any chance that you are pregnant? When was your last menstrual period?     N/a  Protocols used:  Headache-A-AH, Medication Question Call-A-AH

## 2024-06-10 ENCOUNTER — Telehealth: Payer: Self-pay

## 2024-06-10 ENCOUNTER — Other Ambulatory Visit (HOSPITAL_COMMUNITY): Payer: Self-pay

## 2024-06-10 NOTE — Telephone Encounter (Signed)
 Pharmacy Patient Advocate Encounter   Received notification from Patient Pharmacy that prior authorization for Ulbrevy 100mg  tabs is required/requested.   Insurance verification completed.   The patient is insured through [facility].   Is the patient still on the Maxalt  and Zomig  and going to add Ubrelvy? Or a failure for these and start Ubrelvy?  Please advise  Key: [id number]

## 2024-06-12 ENCOUNTER — Telehealth: Payer: Self-pay

## 2024-06-12 ENCOUNTER — Other Ambulatory Visit (HOSPITAL_COMMUNITY): Payer: Self-pay

## 2024-06-12 NOTE — Telephone Encounter (Signed)
 Pharmacy Patient Advocate Encounter   Received notification from CoverMyMeds that prior authorization for Zepbound  2.5MG /0.5ML pen-injectors is due for renewal.   Insurance verification completed.   The patient is insured through [facility].  Action: PA required; PA submitted to above mentioned insurance via Latent Key/confirmation #/EOC [id number] Status is pending

## 2024-06-12 NOTE — Telephone Encounter (Signed)
 Pharmacy Patient Advocate Encounter  Received notification from [employer] that Prior Authorization for  Zepbound  2.5MG /0.5ML pen-injectors has been APPROVED from 06/12/24 to 06/12/25. Unable to obtain price due to refill too soon rejection, last fill date 05/27/24 next available fill date10/15/25   PA #/Case ID/Reference #: [id number]   *RENEWAL*

## 2024-06-15 ENCOUNTER — Other Ambulatory Visit (HOSPITAL_COMMUNITY): Payer: Self-pay

## 2024-06-15 ENCOUNTER — Telehealth: Payer: Self-pay

## 2024-06-15 NOTE — Telephone Encounter (Signed)
 Copied from CRM [id number]. Topic: Clinical - Medication Prior Auth >> Jun 15, 2024  3:43 PM Precious C wrote: Reason for CRM: Joy from [employer] Prior Authorization Department called regarding a prior authorization for the patient. Callback number: [phone]

## 2024-06-15 NOTE — Telephone Encounter (Signed)
 Failure to start ubrelvy

## 2024-06-15 NOTE — Telephone Encounter (Signed)
 Please review previous TE regarding the Ubrevly medication. Thanks in advance.

## 2024-06-15 NOTE — Telephone Encounter (Signed)
 Copied from CRM [id number]. Topic: Clinical - Medication Prior Auth >> Jun 15, 2024  3:20 PM Amy B wrote: Reason for CRM: [facility] states they received clinical notes for the wrong patient.  They request the correct clinical notes be faxed to [phone].  If you have any questions, please call [phone].

## 2024-06-15 NOTE — Telephone Encounter (Signed)
 Pharmacy Patient Advocate Encounter  Received notification from [facility] that Prior Authorization for Ubrevly 100mg  tabs has been APPROVED from 06/15/24 to 06/15/25   PA #/Case ID/Reference #: [id number]  The insurance will allow 8 tabs per 30 days.

## 2024-06-15 NOTE — Telephone Encounter (Signed)
 Pharmacy Patient Advocate Encounter   Received notification from Pt Calls Messages that prior authorization for Ubrelvy 100mg  tabs is required/requested.   Insurance verification completed.   The patient is insured through [facility].   Per test claim: PA required; PA submitted to above mentioned insurance via Latent Key/confirmation #/EOC [facility] Status is pending

## 2024-06-16 ENCOUNTER — Other Ambulatory Visit (HOSPITAL_COMMUNITY): Payer: Self-pay

## 2024-06-16 NOTE — Telephone Encounter (Signed)
 Denise Craig, Natalie A, CPhT to Me     06/16/24  9:18 AM I returned the call to [employer] and spoke with Liza. He stated he did not see any call references about a prior authorization for the patient and he checked the prior auths that were submitted and all had the correct patient info.

## 2024-06-16 NOTE — Telephone Encounter (Signed)
 Pt notified through MyChart.

## 2024-08-03 ENCOUNTER — Encounter: Payer: Self-pay | Admitting: Physician Assistant

## 2024-08-03 MED ORDER — NORTRIPTYLINE HCL 50 MG PO CAPS: 50.0000 mg | ORAL_CAPSULE | Freq: Every day | ORAL | 1 refills | Status: AC

## 2024-08-24 ENCOUNTER — Ambulatory Visit: Admitting: Physician Assistant

## 2024-08-24 ENCOUNTER — Encounter: Payer: Self-pay | Admitting: Physician Assistant

## 2024-08-24 VITALS — BP 138/71 | Ht 66.0 in | Wt 185.0 lb

## 2024-08-24 DIAGNOSIS — G43011 Migraine without aura, intractable, with status migrainosus: Secondary | ICD-10-CM

## 2024-08-24 DIAGNOSIS — E559 Vitamin D deficiency, unspecified: Secondary | ICD-10-CM | POA: Diagnosis not present

## 2024-08-24 DIAGNOSIS — D508 Other iron deficiency anemias: Secondary | ICD-10-CM | POA: Diagnosis not present

## 2024-08-24 DIAGNOSIS — E66811 Obesity, class 1: Secondary | ICD-10-CM

## 2024-08-24 DIAGNOSIS — G43009 Migraine without aura, not intractable, without status migrainosus: Secondary | ICD-10-CM

## 2024-08-24 DIAGNOSIS — R7303 Prediabetes: Secondary | ICD-10-CM

## 2024-08-24 DIAGNOSIS — N184 Chronic kidney disease, stage 4 (severe): Secondary | ICD-10-CM | POA: Diagnosis not present

## 2024-08-24 DIAGNOSIS — F32A Depression, unspecified: Secondary | ICD-10-CM | POA: Diagnosis not present

## 2024-08-24 DIAGNOSIS — Z7689 Persons encountering health services in other specified circumstances: Secondary | ICD-10-CM

## 2024-08-24 DIAGNOSIS — I10 Essential (primary) hypertension: Secondary | ICD-10-CM

## 2024-08-24 DIAGNOSIS — R110 Nausea: Secondary | ICD-10-CM | POA: Diagnosis not present

## 2024-08-24 DIAGNOSIS — Z8639 Personal history of other endocrine, nutritional and metabolic disease: Secondary | ICD-10-CM

## 2024-08-24 DIAGNOSIS — F419 Anxiety disorder, unspecified: Secondary | ICD-10-CM

## 2024-08-24 DIAGNOSIS — K581 Irritable bowel syndrome with constipation: Secondary | ICD-10-CM | POA: Diagnosis not present

## 2024-08-24 DIAGNOSIS — R233 Spontaneous ecchymoses: Secondary | ICD-10-CM

## 2024-08-24 MED ORDER — VORTIOXETINE HBR 20 MG PO TABS: 20.0000 mg | ORAL_TABLET | Freq: Every day | ORAL | 11 refills | Status: DC

## 2024-08-24 MED ORDER — ONDANSETRON 8 MG PO TBDP: ORAL_TABLET | ORAL | 5 refills | Status: AC

## 2024-08-24 MED ORDER — LINACLOTIDE 290 MCG PO CAPS: 290.0000 ug | ORAL_CAPSULE | Freq: Every day | ORAL | 1 refills | Status: AC

## 2024-08-24 MED ORDER — ZEPBOUND 10 MG/0.5ML ~~LOC~~ SOAJ: 10.0000 mg | SUBCUTANEOUS | 1 refills | Status: AC

## 2024-08-24 MED ORDER — QULIPTA 60 MG PO TABS: ORAL_TABLET | ORAL | 1 refills | Status: AC

## 2024-08-25 ENCOUNTER — Encounter: Payer: Self-pay | Admitting: Physician Assistant

## 2024-08-25 DIAGNOSIS — R233 Spontaneous ecchymoses: Secondary | ICD-10-CM | POA: Insufficient documentation

## 2024-08-25 DIAGNOSIS — Z8639 Personal history of other endocrine, nutritional and metabolic disease: Secondary | ICD-10-CM | POA: Insufficient documentation

## 2024-08-25 NOTE — Progress Notes (Signed)
 "  Established Patient Office Visit  Subjective   Patient ID: Denise Craig, female    DOB: April 04, 1971  Age: 53 y.o. MRN: [id number]  Chief Complaint  Patient presents with   Medical Management of Chronic Issues    HPI .SABRADiscussed the use of AI scribe software for clinical note transcription with the patient, who gave verbal consent to proceed.  History of Present Illness [name] is a 53 year old female who presents for weight management.  Weight management - Weight decreased from 201 pounds in September to 185 pounds over the past three months - No structured exercise regimen - Maintains a naturally active lifestyle, walking around the office and achieving approximately 5,000 steps daily  Sleep disturbance - Poor sleep quality despite taking nortriptyline  at night - Nortriptyline  is ineffective for sleep - Frequently wakes up after 15 minutes of sleep due to disturbances but can usually return to sleep  Easy bruising - Easy bruising present - Concern regarding platelet levels  Migraines - on qulipta  for prevention - continues to have at least 15 migraines a month but most can be rescued without missing a whole day - on ubrelvy  for rescue - most recent migraine changed some and she had blurred vision and more visual symptoms.   Cardiopulmonary symptoms - No chest pain - No shortness of breath    ROS See HPI.    Objective:     BP 138/71   Ht 5' 6 (1.676 m)   Wt 185 lb (83.9 kg)   SpO2 99%   BMI 29.86 kg/m  BP Readings from Last 3 Encounters:  08/24/24 138/71  05/27/24 111/76  02/25/24 128/78   Wt Readings from Last 3 Encounters:  08/24/24 185 lb (83.9 kg)  05/27/24 201 lb (91.2 kg)  02/25/24 212 lb (96.2 kg)   ...    08/25/2024   12:32 PM 12/06/2023    9:44 AM 06/18/2023    1:54 PM 12/14/2022   10:29 AM 08/28/2022    9:13 AM  Depression screen PHQ 2/9  Decreased Interest 1 1 1 2 1   Down, Depressed, Hopeless 0 1 1 1  0  PHQ - 2 Score 1 2 2 3  1   Altered sleeping 0 1 2 2 2   Tired, decreased energy 1 1 2 1 2   Change in appetite 0 0 1 1 1   Feeling bad or failure about yourself  0 0 0 1 0  Trouble concentrating 0 0 0 1 1  Moving slowly or fidgety/restless 0 0 0 0 0  Suicidal thoughts 0 0 0 0 0  PHQ-9 Score 2 4  7  9  7    Difficult doing work/chores Not difficult at all Not difficult at all Not difficult at all Somewhat difficult Somewhat difficult     Data saved with a previous flowsheet row definition   ..    08/25/2024   12:32 PM 12/06/2023    9:44 AM 06/18/2023    1:54 PM 12/14/2022   10:29 AM  GAD 7 : Generalized Anxiety Score  Nervous, Anxious, on Edge 0 1 1 1   Control/stop worrying 0 1 1 1   Worry too much - different things 0 1 1 2   Trouble relaxing 0 0 0 1  Restless 0 0 0 1  Easily annoyed or irritable 1 0 1 2  Afraid - awful might happen 0 0 1 0  Total GAD 7 Score 1 3 5 8   Anxiety Difficulty Not difficult at all Not  difficult at all Not difficult at all Somewhat difficult      Physical Exam Constitutional:      Appearance: Normal appearance.  HENT:     Head: Normocephalic.  Cardiovascular:     Rate and Rhythm: Normal rate and regular rhythm.     Heart sounds: Murmur heard.  Pulmonary:     Effort: Pulmonary effort is normal.     Breath sounds: Normal breath sounds.  Musculoskeletal:     Right lower leg: No edema.     Left lower leg: No edema.  Neurological:     General: No focal deficit present.     Mental Status: She is alert and oriented to person, place, and time.  Psychiatric:        Mood and Affect: Mood normal.      The 10-year ASCVD risk score (Arnett DK, et al., 2019) is: 1.8%    Assessment & Plan:  .Denise Craig was seen today for medical management of chronic issues.  Diagnoses and all orders for this visit:  Encounter for weight management -     CBC with Differential/Platelet -     CMP14+EGFR -     Lipid panel -     VITAMIN D  25 Hydroxy (Vit-D Deficiency, Fractures) -     TSH +  free T4 -     Fe+TIBC+Fer -     Hemoglobin A1c  Essential hypertension  History of obesity -     tirzepatide  (ZEPBOUND ) 10 MG/0.5ML Pen; Inject 10 mg into the skin once a week.  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  Intractable migraine without aura and with status migrainosus -     Atogepant  (QULIPTA ) 60 MG TABS; Take one tablet daily.  Anxiety and depression -     vortioxetine  HBr (TRINTELLIX ) 20 MG TABS tablet; Take 1 tablet (20 mg total) by mouth daily.  Irritable bowel syndrome with constipation -     linaclotide  (LINZESS ) 290 MCG CAPS capsule; Take 1 capsule (290 mcg total) by mouth daily.  Nausea -     ondansetron  (ZOFRAN -ODT) 8 MG disintegrating tablet; DISSOLVE 1 TABLET ON THE TONGUE  EVERY 8 HOURS AS NEEDED FOR  NAUSEA AND VOMITING  Easy bruising   Assessment & Plan History of obesity and currently overweight Significant weight loss of 40 pounds since September, now 185 pounds. Engages in natural activity with 5000 steps/day. Nortriptyline  ineffective for sleep. - Encouraged increased physical activity as feasible. - refilled zepbound  10mg  weekly and zofran  as needed - Follow up in 6 months  Pre-diabetes - A1C ordered in labs  GAD/MDD PHQ/GAD not concerning -refilled trintellix   IBS-constipation - start linzess  daily  Migraines - continue on qulipta  and ubrelvy  - if migraines changing  follow up  General Health  Maintenance Requires blood work for work-related purposes. Reports easy bruising, possibly due to decreased collagen with age and potential blood thinning effects of medications. - Ordered blood work to check platelet levels and other necessary parameters. - Scheduled blood work within the current year.      Return in about 6 months (around 02/22/2025).    Fidela Cieslak, PA-C  "

## 2024-08-29 LAB — CBC WITH DIFFERENTIAL/PLATELET
Basophils Absolute: 0.1 x10E3/uL (ref 0.0–0.2)
Basos: 2 %
EOS (ABSOLUTE): 0.2 x10E3/uL (ref 0.0–0.4)
Eos: 3 %
Hematocrit: 43.8 % (ref 34.0–46.6)
Hemoglobin: 13.8 g/dL (ref 11.1–15.9)
Immature Grans (Abs): 0 x10E3/uL (ref 0.0–0.1)
Immature Granulocytes: 0 %
Lymphocytes Absolute: 1.5 x10E3/uL (ref 0.7–3.1)
Lymphs: 30 %
MCH: 28.7 pg (ref 26.6–33.0)
MCHC: 31.5 g/dL (ref 31.5–35.7)
MCV: 91 fL (ref 79–97)
Monocytes Absolute: 0.4 x10E3/uL (ref 0.1–0.9)
Monocytes: 8 %
Neutrophils Absolute: 2.8 x10E3/uL (ref 1.4–7.0)
Neutrophils: 57 %
Platelets: 223 x10E3/uL (ref 150–450)
RBC: 4.81 x10E6/uL (ref 3.77–5.28)
RDW: 13.8 % (ref 11.7–15.4)
WBC: 4.9 x10E3/uL (ref 3.4–10.8)

## 2024-08-29 LAB — LIPID PANEL
Chol/HDL Ratio: 2.6 ratio (ref 0.0–4.4)
Cholesterol, Total: 206 mg/dL — ABNORMAL HIGH (ref 100–199)
HDL: 78 mg/dL
LDL Chol Calc (NIH): 113 mg/dL — ABNORMAL HIGH (ref 0–99)
Triglycerides: 82 mg/dL (ref 0–149)
VLDL Cholesterol Cal: 15 mg/dL (ref 5–40)

## 2024-08-29 LAB — VITAMIN D 25 HYDROXY (VIT D DEFICIENCY, FRACTURES): Vit D, 25-Hydroxy: 24.1 ng/mL — ABNORMAL LOW (ref 30.0–100.0)

## 2024-08-29 LAB — TSH+FREE T4
Free T4: 1.3 ng/dL (ref 0.82–1.77)
TSH: 1.25 u[IU]/mL (ref 0.450–4.500)

## 2024-08-29 LAB — IRON,TIBC AND FERRITIN PANEL
Ferritin: 38 ng/mL (ref 15–150)
Iron Saturation: 26 % (ref 15–55)
Iron: 74 ug/dL (ref 27–159)
Total Iron Binding Capacity: 290 ug/dL (ref 250–450)
UIBC: 216 ug/dL (ref 131–425)

## 2024-08-29 LAB — HEMOGLOBIN A1C
Est. average glucose Bld gHb Est-mCnc: 100 mg/dL
Hgb A1c MFr Bld: 5.1 % (ref 4.8–5.6)

## 2024-08-29 LAB — CMP14+EGFR
ALT: 11 IU/L (ref 0–32)
AST: 12 IU/L (ref 0–40)
Albumin: 4.3 g/dL (ref 3.8–4.9)
Alkaline Phosphatase: 71 IU/L (ref 49–135)
BUN/Creatinine Ratio: 13 (ref 9–23)
BUN: 15 mg/dL (ref 6–24)
Bilirubin Total: 0.3 mg/dL (ref 0.0–1.2)
CO2: 25 mmol/L (ref 20–29)
Calcium: 9.5 mg/dL (ref 8.7–10.2)
Chloride: 104 mmol/L (ref 96–106)
Creatinine, Ser: 1.19 mg/dL — ABNORMAL HIGH (ref 0.57–1.00)
Globulin, Total: 2.2 g/dL (ref 1.5–4.5)
Glucose: 89 mg/dL (ref 70–99)
Potassium: 4.4 mmol/L (ref 3.5–5.2)
Sodium: 141 mmol/L (ref 134–144)
Total Protein: 6.5 g/dL (ref 6.0–8.5)
eGFR: 55 mL/min/1.73 — ABNORMAL LOW

## 2024-09-01 ENCOUNTER — Encounter: Payer: Self-pay | Admitting: Physician Assistant

## 2024-09-01 ENCOUNTER — Ambulatory Visit: Payer: Self-pay | Admitting: Physician Assistant

## 2024-09-01 MED ORDER — VITAMIN D (ERGOCALCIFEROL) 1.25 MG (50000 UNIT) PO CAPS: 50000.0000 [IU] | ORAL_CAPSULE | ORAL | 3 refills | Status: DC

## 2024-09-01 NOTE — Progress Notes (Signed)
 Paisli,   Kidney function stable from last check.  Protein improved which is good.  Vitamin D  better but not to goal. Continue high dose weekly taken with diary for better absorption.  Hemoglobin looks good.  Thyroid looks good.  A1C normal and improving.  LDL, bad cholesterol, improving.

## 2024-09-01 NOTE — Telephone Encounter (Signed)
 Spoke with the patient she is referring to the labs that were drawn on 08/28/24. She is also asking about a biometrics form that she gave to Jade in her OV on 08/24/24. I don't see it in the media tab but I'm assuming vermell was waiting for the lab results from 12/26 as she hadn't had blood work done for this year. Please advise she states it needs to be turned in before the end of the year.

## 2024-09-02 NOTE — Telephone Encounter (Signed)
 Biometric forms completed and faxed today to provided fax number [phone]. Patient is aware

## 2024-09-09 ENCOUNTER — Encounter: Payer: Self-pay | Admitting: Physician Assistant

## 2024-09-09 DIAGNOSIS — F419 Anxiety disorder, unspecified: Secondary | ICD-10-CM

## 2024-09-09 MED ORDER — VITAMIN D (ERGOCALCIFEROL) 1.25 MG (50000 UNIT) PO CAPS: 50000.0000 [IU] | ORAL_CAPSULE | ORAL | 3 refills | Status: AC

## 2024-09-09 MED ORDER — VORTIOXETINE HBR 20 MG PO TABS: 20.0000 mg | ORAL_TABLET | Freq: Every day | ORAL | 3 refills | Status: AC

## 2024-10-08 ENCOUNTER — Other Ambulatory Visit: Payer: Self-pay | Admitting: Physician Assistant

## 2024-10-08 DIAGNOSIS — I10 Essential (primary) hypertension: Secondary | ICD-10-CM

## 2025-02-22 ENCOUNTER — Ambulatory Visit: Admitting: Physician Assistant
